# Patient Record
Sex: Female | Born: 1965 | Race: White | Hispanic: No | Marital: Married | Smoking: Former smoker
Health system: Southern US, Community
[De-identification: ages and names within clinical notes are randomized; demographics above are authoritative.]

## PROBLEM LIST (undated history)

## (undated) DIAGNOSIS — Z8619 Personal history of other infectious and parasitic diseases: Secondary | ICD-10-CM

## (undated) DIAGNOSIS — J3089 Other allergic rhinitis: Secondary | ICD-10-CM

## (undated) DIAGNOSIS — E785 Hyperlipidemia, unspecified: Secondary | ICD-10-CM

## (undated) DIAGNOSIS — F32A Depression, unspecified: Secondary | ICD-10-CM

## (undated) DIAGNOSIS — R51 Headache: Secondary | ICD-10-CM

## (undated) DIAGNOSIS — I8390 Asymptomatic varicose veins of unspecified lower extremity: Secondary | ICD-10-CM

## (undated) DIAGNOSIS — Z87442 Personal history of urinary calculi: Secondary | ICD-10-CM

## (undated) DIAGNOSIS — I872 Venous insufficiency (chronic) (peripheral): Secondary | ICD-10-CM

## (undated) DIAGNOSIS — N393 Stress incontinence (female) (male): Secondary | ICD-10-CM

## (undated) DIAGNOSIS — K219 Gastro-esophageal reflux disease without esophagitis: Secondary | ICD-10-CM

## (undated) DIAGNOSIS — M797 Fibromyalgia: Principal | ICD-10-CM

## (undated) DIAGNOSIS — K644 Residual hemorrhoidal skin tags: Secondary | ICD-10-CM

## (undated) DIAGNOSIS — K648 Other hemorrhoids: Secondary | ICD-10-CM

## (undated) DIAGNOSIS — F419 Anxiety disorder, unspecified: Secondary | ICD-10-CM

## (undated) DIAGNOSIS — J4520 Mild intermittent asthma, uncomplicated: Secondary | ICD-10-CM

## (undated) DIAGNOSIS — F329 Major depressive disorder, single episode, unspecified: Secondary | ICD-10-CM

## (undated) DIAGNOSIS — R911 Solitary pulmonary nodule: Secondary | ICD-10-CM

## (undated) DIAGNOSIS — N8500 Endometrial hyperplasia, unspecified: Secondary | ICD-10-CM

## (undated) DIAGNOSIS — K449 Diaphragmatic hernia without obstruction or gangrene: Secondary | ICD-10-CM

## (undated) DIAGNOSIS — Z973 Presence of spectacles and contact lenses: Secondary | ICD-10-CM

## (undated) DIAGNOSIS — M1990 Unspecified osteoarthritis, unspecified site: Secondary | ICD-10-CM

## (undated) DIAGNOSIS — Z8719 Personal history of other diseases of the digestive system: Secondary | ICD-10-CM

## (undated) DIAGNOSIS — K589 Irritable bowel syndrome without diarrhea: Secondary | ICD-10-CM

## (undated) DIAGNOSIS — N3010 Interstitial cystitis (chronic) without hematuria: Secondary | ICD-10-CM

## (undated) HISTORY — DX: Headache: R51

## (undated) HISTORY — DX: Diaphragmatic hernia without obstruction or gangrene: K44.9

## (undated) HISTORY — DX: Solitary pulmonary nodule: R91.1

## (undated) HISTORY — DX: Irritable bowel syndrome without diarrhea: K58.9

## (undated) HISTORY — DX: Venous insufficiency (chronic) (peripheral): I87.2

## (undated) HISTORY — DX: Gastro-esophageal reflux disease without esophagitis: K21.9

## (undated) HISTORY — DX: Residual hemorrhoidal skin tags: K64.4

## (undated) HISTORY — DX: Asymptomatic varicose veins of unspecified lower extremity: I83.90

## (undated) HISTORY — DX: Anxiety disorder, unspecified: F41.9

## (undated) HISTORY — DX: Other hemorrhoids: K64.8

## (undated) HISTORY — DX: Personal history of urinary calculi: Z87.442

## (undated) HISTORY — DX: Depression, unspecified: F32.A

## (undated) HISTORY — PX: ESOPHAGOGASTRODUODENOSCOPY: SHX1529

## (undated) HISTORY — PX: TONSILLECTOMY: SUR1361

## (undated) HISTORY — DX: Major depressive disorder, single episode, unspecified: F32.9

## (undated) HISTORY — DX: Hyperlipidemia, unspecified: E78.5

## (undated) HISTORY — DX: Fibromyalgia: M79.7

## (undated) HISTORY — PX: COLONOSCOPY: SHX174

## (undated) HISTORY — DX: Interstitial cystitis (chronic) without hematuria: N30.10

---
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
## 1995-06-25 DIAGNOSIS — M797 Fibromyalgia: Secondary | ICD-10-CM

## 1995-06-25 HISTORY — DX: Fibromyalgia: M79.7

## 1999-04-03 ENCOUNTER — Other Ambulatory Visit: Admission: RE | Admit: 1999-04-03 | Discharge: 1999-04-03 | Payer: Self-pay | Admitting: Obstetrics and Gynecology

## 1999-11-01 ENCOUNTER — Ambulatory Visit: Admission: RE | Admit: 1999-11-01 | Discharge: 1999-11-01 | Payer: Self-pay | Admitting: Obstetrics and Gynecology

## 2000-02-20 ENCOUNTER — Encounter (INDEPENDENT_AMBULATORY_CARE_PROVIDER_SITE_OTHER): Payer: Self-pay

## 2000-02-20 ENCOUNTER — Ambulatory Visit (HOSPITAL_COMMUNITY): Admission: RE | Admit: 2000-02-20 | Discharge: 2000-02-20 | Payer: Self-pay | Admitting: Obstetrics and Gynecology

## 2000-05-22 ENCOUNTER — Other Ambulatory Visit: Admission: RE | Admit: 2000-05-22 | Discharge: 2000-05-22 | Payer: Self-pay | Admitting: Obstetrics and Gynecology

## 2000-06-24 DIAGNOSIS — E785 Hyperlipidemia, unspecified: Secondary | ICD-10-CM

## 2000-06-24 HISTORY — DX: Hyperlipidemia, unspecified: E78.5

## 2000-08-26 ENCOUNTER — Ambulatory Visit (HOSPITAL_COMMUNITY): Admission: RE | Admit: 2000-08-26 | Discharge: 2000-08-26 | Payer: Self-pay | Admitting: Obstetrics and Gynecology

## 2000-08-26 ENCOUNTER — Encounter: Payer: Self-pay | Admitting: Obstetrics and Gynecology

## 2001-06-02 ENCOUNTER — Other Ambulatory Visit: Admission: RE | Admit: 2001-06-02 | Discharge: 2001-06-02 | Payer: Self-pay | Admitting: Obstetrics & Gynecology

## 2001-10-30 ENCOUNTER — Encounter: Payer: Self-pay | Admitting: Obstetrics & Gynecology

## 2001-10-30 ENCOUNTER — Ambulatory Visit (HOSPITAL_COMMUNITY): Admission: RE | Admit: 2001-10-30 | Discharge: 2001-10-30 | Payer: Self-pay | Admitting: Obstetrics & Gynecology

## 2002-05-24 ENCOUNTER — Emergency Department (HOSPITAL_COMMUNITY): Admission: EM | Admit: 2002-05-24 | Discharge: 2002-05-24 | Payer: Self-pay | Admitting: Emergency Medicine

## 2002-05-25 ENCOUNTER — Encounter: Admission: RE | Admit: 2002-05-25 | Discharge: 2002-05-25 | Payer: Self-pay | Admitting: Family Medicine

## 2002-05-25 ENCOUNTER — Encounter: Payer: Self-pay | Admitting: Family Medicine

## 2002-06-04 ENCOUNTER — Other Ambulatory Visit: Admission: RE | Admit: 2002-06-04 | Discharge: 2002-06-04 | Payer: Self-pay | Admitting: Gynecology

## 2002-11-15 ENCOUNTER — Encounter: Payer: Self-pay | Admitting: Family Medicine

## 2002-11-15 ENCOUNTER — Encounter: Admission: RE | Admit: 2002-11-15 | Discharge: 2002-11-15 | Payer: Self-pay | Admitting: Family Medicine

## 2002-11-16 ENCOUNTER — Encounter: Admission: RE | Admit: 2002-11-16 | Discharge: 2002-11-16 | Payer: Self-pay | Admitting: Family Medicine

## 2002-11-16 ENCOUNTER — Encounter: Payer: Self-pay | Admitting: Family Medicine

## 2003-05-12 ENCOUNTER — Ambulatory Visit (HOSPITAL_COMMUNITY): Admission: RE | Admit: 2003-05-12 | Discharge: 2003-05-12 | Payer: Self-pay | Admitting: Gastroenterology

## 2003-05-12 ENCOUNTER — Encounter (INDEPENDENT_AMBULATORY_CARE_PROVIDER_SITE_OTHER): Payer: Self-pay | Admitting: Specialist

## 2003-06-09 ENCOUNTER — Other Ambulatory Visit: Admission: RE | Admit: 2003-06-09 | Discharge: 2003-06-09 | Payer: Self-pay | Admitting: *Deleted

## 2003-07-25 ENCOUNTER — Encounter: Admission: RE | Admit: 2003-07-25 | Discharge: 2003-07-25 | Payer: Self-pay | Admitting: Neurosurgery

## 2003-09-06 ENCOUNTER — Encounter: Admission: RE | Admit: 2003-09-06 | Discharge: 2003-09-12 | Payer: Self-pay | Admitting: Neurosurgery

## 2004-04-02 ENCOUNTER — Encounter: Admission: RE | Admit: 2004-04-02 | Discharge: 2004-04-02 | Payer: Self-pay | Admitting: Family Medicine

## 2004-06-12 ENCOUNTER — Other Ambulatory Visit: Admission: RE | Admit: 2004-06-12 | Discharge: 2004-06-12 | Payer: Self-pay | Admitting: Obstetrics & Gynecology

## 2004-06-24 DIAGNOSIS — N3010 Interstitial cystitis (chronic) without hematuria: Secondary | ICD-10-CM

## 2004-06-24 HISTORY — DX: Interstitial cystitis (chronic) without hematuria: N30.10

## 2004-11-27 ENCOUNTER — Encounter: Admission: RE | Admit: 2004-11-27 | Discharge: 2004-11-27 | Payer: Self-pay | Admitting: Gastroenterology

## 2005-08-16 ENCOUNTER — Other Ambulatory Visit: Admission: RE | Admit: 2005-08-16 | Discharge: 2005-08-16 | Payer: Self-pay | Admitting: Obstetrics & Gynecology

## 2005-10-15 ENCOUNTER — Encounter: Admission: RE | Admit: 2005-10-15 | Discharge: 2005-10-15 | Payer: Self-pay | Admitting: Gastroenterology

## 2005-10-18 ENCOUNTER — Encounter: Admission: RE | Admit: 2005-10-18 | Discharge: 2005-10-18 | Payer: Self-pay | Admitting: Rheumatology

## 2006-06-24 DIAGNOSIS — K589 Irritable bowel syndrome without diarrhea: Secondary | ICD-10-CM

## 2006-06-24 HISTORY — DX: Irritable bowel syndrome, unspecified: K58.9

## 2008-08-17 ENCOUNTER — Encounter: Admission: RE | Admit: 2008-08-17 | Discharge: 2008-08-17 | Payer: Self-pay | Admitting: Family Medicine

## 2008-09-14 ENCOUNTER — Emergency Department (HOSPITAL_BASED_OUTPATIENT_CLINIC_OR_DEPARTMENT_OTHER): Admission: EM | Admit: 2008-09-14 | Discharge: 2008-09-14 | Payer: Self-pay | Admitting: Emergency Medicine

## 2008-09-14 ENCOUNTER — Ambulatory Visit: Payer: Self-pay | Admitting: Interventional Radiology

## 2008-09-14 DIAGNOSIS — R911 Solitary pulmonary nodule: Secondary | ICD-10-CM

## 2008-09-14 HISTORY — DX: Solitary pulmonary nodule: R91.1

## 2009-02-10 ENCOUNTER — Ambulatory Visit: Payer: Self-pay | Admitting: Diagnostic Radiology

## 2009-02-10 ENCOUNTER — Telehealth: Payer: Self-pay | Admitting: Internal Medicine

## 2009-02-10 ENCOUNTER — Ambulatory Visit (HOSPITAL_BASED_OUTPATIENT_CLINIC_OR_DEPARTMENT_OTHER): Admission: RE | Admit: 2009-02-10 | Discharge: 2009-02-10 | Payer: Self-pay | Admitting: Internal Medicine

## 2009-02-10 ENCOUNTER — Ambulatory Visit: Payer: Self-pay | Admitting: Internal Medicine

## 2009-02-10 DIAGNOSIS — F411 Generalized anxiety disorder: Secondary | ICD-10-CM | POA: Insufficient documentation

## 2009-02-10 DIAGNOSIS — E785 Hyperlipidemia, unspecified: Secondary | ICD-10-CM | POA: Insufficient documentation

## 2009-02-10 DIAGNOSIS — J309 Allergic rhinitis, unspecified: Secondary | ICD-10-CM | POA: Insufficient documentation

## 2009-02-10 DIAGNOSIS — F418 Other specified anxiety disorders: Secondary | ICD-10-CM | POA: Insufficient documentation

## 2009-02-10 DIAGNOSIS — E669 Obesity, unspecified: Secondary | ICD-10-CM

## 2009-02-10 DIAGNOSIS — K219 Gastro-esophageal reflux disease without esophagitis: Secondary | ICD-10-CM

## 2009-03-29 ENCOUNTER — Ambulatory Visit: Payer: Self-pay | Admitting: Internal Medicine

## 2009-03-31 ENCOUNTER — Encounter: Payer: Self-pay | Admitting: Internal Medicine

## 2009-03-31 LAB — CONVERTED CEMR LAB
ALT: 14 units/L (ref 0–35)
AST: 17 units/L (ref 0–37)
Alkaline Phosphatase: 92 units/L (ref 39–117)
Eosinophils Absolute: 0.2 10*3/uL (ref 0.0–0.7)
Eosinophils Relative: 2 % (ref 0–5)
Glucose, Bld: 96 mg/dL (ref 70–99)
Hemoglobin: 12.2 g/dL (ref 12.0–15.0)
Indirect Bilirubin: 0.2 mg/dL (ref 0.0–0.9)
Lymphs Abs: 3.3 10*3/uL (ref 0.7–4.0)
MCHC: 31.3 g/dL (ref 30.0–36.0)
MCV: 87.1 fL (ref 78.0–100.0)
Neutro Abs: 5.6 10*3/uL (ref 1.7–7.7)
RBC: 4.48 M/uL (ref 3.87–5.11)
RDW: 14.2 % (ref 11.5–15.5)
Sodium: 140 meq/L (ref 135–145)
Total Bilirubin: 0.3 mg/dL (ref 0.3–1.2)
Total CHOL/HDL Ratio: 4.8
Total Protein: 6.7 g/dL (ref 6.0–8.3)
Triglycerides: 148 mg/dL (ref <150)

## 2009-04-07 ENCOUNTER — Ambulatory Visit: Payer: Self-pay | Admitting: Internal Medicine

## 2009-04-07 DIAGNOSIS — J40 Bronchitis, not specified as acute or chronic: Secondary | ICD-10-CM | POA: Insufficient documentation

## 2009-04-09 ENCOUNTER — Telehealth: Payer: Self-pay | Admitting: Internal Medicine

## 2009-04-11 ENCOUNTER — Ambulatory Visit (HOSPITAL_BASED_OUTPATIENT_CLINIC_OR_DEPARTMENT_OTHER): Admission: RE | Admit: 2009-04-11 | Discharge: 2009-04-11 | Payer: Self-pay | Admitting: Internal Medicine

## 2009-04-11 ENCOUNTER — Ambulatory Visit: Payer: Self-pay | Admitting: Radiology

## 2009-04-11 ENCOUNTER — Telehealth: Payer: Self-pay | Admitting: Internal Medicine

## 2009-04-11 ENCOUNTER — Ambulatory Visit: Payer: Self-pay | Admitting: Internal Medicine

## 2009-04-20 ENCOUNTER — Telehealth: Payer: Self-pay | Admitting: Family

## 2009-04-25 ENCOUNTER — Telehealth: Payer: Self-pay | Admitting: Internal Medicine

## 2009-04-26 ENCOUNTER — Ambulatory Visit: Payer: Self-pay | Admitting: Internal Medicine

## 2009-04-27 ENCOUNTER — Telehealth: Payer: Self-pay | Admitting: Internal Medicine

## 2009-05-01 ENCOUNTER — Encounter: Payer: Self-pay | Admitting: Internal Medicine

## 2009-05-10 ENCOUNTER — Telehealth: Payer: Self-pay | Admitting: Internal Medicine

## 2009-05-15 LAB — HM MAMMOGRAPHY: HM Mammogram: NORMAL

## 2009-05-25 ENCOUNTER — Telehealth: Payer: Self-pay | Admitting: Internal Medicine

## 2009-05-26 ENCOUNTER — Telehealth: Payer: Self-pay | Admitting: Internal Medicine

## 2009-05-26 ENCOUNTER — Ambulatory Visit: Payer: Self-pay | Admitting: Family

## 2009-05-26 DIAGNOSIS — J019 Acute sinusitis, unspecified: Secondary | ICD-10-CM

## 2009-06-20 ENCOUNTER — Telehealth: Payer: Self-pay | Admitting: Internal Medicine

## 2009-07-05 ENCOUNTER — Telehealth: Payer: Self-pay | Admitting: Internal Medicine

## 2009-07-10 ENCOUNTER — Telehealth: Payer: Self-pay | Admitting: Internal Medicine

## 2009-08-29 ENCOUNTER — Ambulatory Visit: Payer: Self-pay | Admitting: Internal Medicine

## 2009-08-29 DIAGNOSIS — N390 Urinary tract infection, site not specified: Secondary | ICD-10-CM | POA: Insufficient documentation

## 2009-08-29 LAB — CONVERTED CEMR LAB
Bilirubin Urine: NEGATIVE
Glucose, Urine, Semiquant: NEGATIVE
Ketones, urine, test strip: NEGATIVE
Specific Gravity, Urine: 1.015

## 2009-09-01 ENCOUNTER — Telehealth: Payer: Self-pay | Admitting: Internal Medicine

## 2009-09-26 ENCOUNTER — Telehealth: Payer: Self-pay | Admitting: Internal Medicine

## 2009-10-16 ENCOUNTER — Telehealth: Payer: Self-pay | Admitting: Internal Medicine

## 2009-10-23 ENCOUNTER — Encounter: Payer: Self-pay | Admitting: Internal Medicine

## 2009-10-23 ENCOUNTER — Telehealth: Payer: Self-pay | Admitting: Internal Medicine

## 2009-11-07 ENCOUNTER — Telehealth: Payer: Self-pay | Admitting: Internal Medicine

## 2009-11-10 ENCOUNTER — Ambulatory Visit: Payer: Self-pay | Admitting: Internal Medicine

## 2009-11-15 ENCOUNTER — Telehealth: Payer: Self-pay | Admitting: Internal Medicine

## 2009-11-27 ENCOUNTER — Telehealth: Payer: Self-pay | Admitting: Internal Medicine

## 2009-12-13 ENCOUNTER — Telehealth: Payer: Self-pay | Admitting: Internal Medicine

## 2009-12-15 ENCOUNTER — Ambulatory Visit: Payer: Self-pay | Admitting: Internal Medicine

## 2009-12-15 LAB — CONVERTED CEMR LAB
Glucose, Urine, Semiquant: NEGATIVE
Nitrite: NEGATIVE
Urobilinogen, UA: 0.2

## 2009-12-20 ENCOUNTER — Telehealth: Payer: Self-pay | Admitting: Internal Medicine

## 2010-02-09 ENCOUNTER — Telehealth: Payer: Self-pay | Admitting: Internal Medicine

## 2010-02-27 ENCOUNTER — Telehealth: Payer: Self-pay | Admitting: Internal Medicine

## 2010-03-05 ENCOUNTER — Ambulatory Visit: Payer: Self-pay | Admitting: Internal Medicine

## 2010-03-05 ENCOUNTER — Ambulatory Visit: Payer: Self-pay | Admitting: Diagnostic Radiology

## 2010-03-05 ENCOUNTER — Ambulatory Visit (HOSPITAL_BASED_OUTPATIENT_CLINIC_OR_DEPARTMENT_OTHER): Admission: RE | Admit: 2010-03-05 | Discharge: 2010-03-05 | Payer: Self-pay | Admitting: Internal Medicine

## 2010-03-07 ENCOUNTER — Telehealth: Payer: Self-pay | Admitting: Internal Medicine

## 2010-03-13 ENCOUNTER — Telehealth: Payer: Self-pay | Admitting: Internal Medicine

## 2010-03-26 ENCOUNTER — Ambulatory Visit: Payer: Self-pay | Admitting: Internal Medicine

## 2010-03-27 ENCOUNTER — Encounter: Payer: Self-pay | Admitting: Internal Medicine

## 2010-04-05 ENCOUNTER — Ambulatory Visit: Payer: Self-pay | Admitting: Internal Medicine

## 2010-05-08 ENCOUNTER — Ambulatory Visit: Payer: Self-pay | Admitting: Internal Medicine

## 2010-05-08 DIAGNOSIS — M79609 Pain in unspecified limb: Secondary | ICD-10-CM | POA: Insufficient documentation

## 2010-05-14 ENCOUNTER — Encounter: Payer: Self-pay | Admitting: Internal Medicine

## 2010-06-24 DIAGNOSIS — Z8619 Personal history of other infectious and parasitic diseases: Secondary | ICD-10-CM

## 2010-06-24 HISTORY — DX: Personal history of other infectious and parasitic diseases: Z86.19

## 2010-07-26 NOTE — Progress Notes (Signed)
Summary: sertraline, fluoxetine--unable to contact mailed letter  Phone Note Outgoing Call   Summary of Call: call pt - please make sure pt not taking both fluoxetine and sertraline Initial call taken by: D. Thomos Lemons DO,  Oct 23, 2009 1:13 PM  Follow-up for Phone Call        Numbers listed for pt. are not in service.  Letter mailed to pt. to contact office.  Mervin Kung CMA  Oct 23, 2009 1:22 PM      Appended Document: sertraline, fluoxetine--unable to contact mailed letter Spoke to pt. At 1:05pm. She states she is not taking Sertraline any longer. She is weaning off Fluoxetine and will start Effexor.

## 2010-07-26 NOTE — Progress Notes (Signed)
Summary: Status Update  Phone Note Call from Patient Call back at Home Phone [phone]   Caller: Patient Call For: D. Thomos Lemons DO Summary of Call: patient called and left voice message stating she is feeling more depressed and may need a higher dose of the Celexa. She states she was taking a higher dose with the Zoloft and was not sure how the doses would compare, but feels she needs a higher dose. Please advise Initial call taken by: Glendell Docker CMA,  December 20, 2009 4:20 PM  Follow-up for Phone Call        see new rx.  needs f/u within 2 wks of starting medication Follow-up by: D. Thomos Lemons DO,  December 21, 2009 1:54 PM  Additional Follow-up for Phone Call Additional follow up Details #1::        patient advised per Dr Artist Pais instructions Additional Follow-up by: Glendell Docker CMA,  December 21, 2009 3:12 PM    New/Updated Medications: CITALOPRAM HYDROBROMIDE 40 MG TABS (CITALOPRAM HYDROBROMIDE) one by mouth once daily Prescriptions: CITALOPRAM HYDROBROMIDE 40 MG TABS (CITALOPRAM HYDROBROMIDE) one by mouth once daily  #30 x 2   Entered and Authorized by:   D. Thomos Lemons DO   Signed by:   D. Thomos Lemons DO on 12/21/2009   Method used:   [facility]  [place] [address] [facility]       [address]       [place], [place]  [address]       Ph: [phone] or [phone]       Fax: [phone]   RxID:   [id number]
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
## 2010-07-26 NOTE — Progress Notes (Signed)
Summary: med questions  Phone Note Call from Patient Call back at [phone] after 2pm   Caller: Patient Call For: D. Thomos Lemons DO Summary of Call: Received message from pt stating that Symbicort makes her nervous and shaky. Wants to know if this is normal and will it go away? Stomach burns for 1 -2 hours after taking azithromycin even though she takes it with food. Should she continue taking it? She can still hear crackling in her chest. Should she take Robitussin to break it up?  Please advise.  Nicki Guadalajara Fergerson CMA Duncan Dull)  March 07, 2010 1:47 PM   Follow-up for Phone Call        yes, it is common rxn with use of symbicort that it will make her feel shaky. I suggest she try drinking extra water with azithromycin  ok to take robitussin or guafenisin Follow-up by: D. Thomos Lemons DO,  March 07, 2010 8:51 PM  Additional Follow-up for Phone Call Additional follow up Details #1::        read Dr Olegario Messier answer to patient  Roselle Locus  March 08, 2010 11:58 AM

## 2010-07-26 NOTE — Progress Notes (Signed)
Summary: Omeprazole Refill  Phone Note Refill Request Message from:  Fax from [employer] on Nov 15, 2009 9:40 AM  Refills Requested: Medication #1:  OMEPRAZOLE-SODIUM BICARBONATE 40-1100 MG CAPS one by mouth two times a day   Dosage confirmed as above?Dosage Confirmed   Brand Name Necessary? No   Supply Requested: 3 months  Method Requested: Electronic Next Appointment Scheduled: None Initial call taken by: Glendell Docker CMA,  Nov 15, 2009 9:41 AM  Follow-up for Phone Call        Rx faxed to pharmacy Follow-up by: Glendell Docker CMA,  Nov 15, 2009 9:43 AM    Prescriptions: Maxwell Caul BICARBONATE 40-1100 MG CAPS (OMEPRAZOLE-SODIUM BICARBONATE) one by mouth two times a day  #180 x 4   Entered by:   Glendell Docker CMA   Authorized by:   D. Thomos Lemons DO   Signed by:   Glendell Docker CMA on 11/15/2009   Method used:   Faxed to ...       [facility] F[facility]) (mail-order)             ,          Ph: [phone]       Fax: [phone]   RxID:   [id number]

## 2010-07-26 NOTE — Progress Notes (Signed)
Summary: Medication Refills  Phone Note Refill Request Message from:  Patient on September 26, 2009 4:34 PM  Refills Requested: Medication #1:  ASTELIN 137 MCG/SPRAY SOLN 2 sprays each nostril once daily   Supply Requested: 3 months  Medication #2:  SIMVASTATIN 40 MG TABS Take 1 tablet by mouth once a day   Supply Requested: 3 months  Medication #3:  FENOFIBRATE 160 MG TABS Take 1 tablet by mouth once a day   Supply Requested: 3 months patient called and left voice message requesting refills to mail order right source    Method Requested: Electronic Initial call taken by: Glendell Docker CMA,  September 26, 2009 4:35 PM  Follow-up for Phone Call        Rx completed in Dr. Tiajuana Amass Follow-up by: Glendell Docker CMA,  September 26, 2009 4:37 PM    Prescriptions: SIMVASTATIN 40 MG TABS (SIMVASTATIN) Take 1 tablet by mouth once a day  #90 x 2   Entered by:   Glendell Docker CMA   Authorized by:   D. Thomos Lemons DO   Signed by:   Glendell Docker CMA on 09/26/2009   Method used:   Electronically to        [facility] [address] (mail[facility]order)       [facility]       [address]       [place], [place]  [address]       Ph: [phone]       Fax: [phone]   RxID:   [id number] FENOFIBRATE 160 MG TABS (FENOFIBRATE) Take 1 tablet by mouth once a day  #90 x 2   Entered by:   Glendell Docker CMA   Authorized by:   D. Thomos Lemons DO   Signed by:   Glendell Docker CMA on 09/26/2009   Method used:   Electronically to        [facility] [address] (mail-order)       [facility]       [address]       [place], [place]  [address]       Ph: [phone]       Fax: [phone]   RxID:   [id number] ASTELIN 137 MCG/SPRAY SOLN (AZELASTINE HCL) 2 sprays each nostril once daily  #90 x 2   Entered by:   Glendell Docker CMA   Authorized by:   D. Thomos Lemons DO   Signed by:   Glendell Docker CMA on 09/26/2009   Method used:   Electronically to        [facility] (mail[facility]order)       [facility]       [address]       [place], [place]  [address]       Ph: [phone]       Fax: [phone]   RxID:   [id number]

## 2010-07-26 NOTE — Progress Notes (Signed)
Summary: Medication Status  Update  Phone Note Call from Patient Call back at [phone]   Caller: Patient Summary of Call: patient called and left voice message stating she started the Effexor 75mg  and she stopped it because she started to have bad dreams and unclear thoughts. She states she has decided to stay on the 50mg  of Zoloft.  Initial call taken by: Glendell Docker CMA,  July 10, 2009 4:25 PM  Follow-up for Phone Call        noted Follow-up by: D. Thomos Lemons DO,  July 10, 2009 4:54 PM   New Allergies: EFFEXOR XR (VENLAFAXINE HCL) New/Updated Medications: SERTRALINE HCL 50 MG TABS (SERTRALINE HCL) one by mouth qd New Allergies: EFFEXOR XR (VENLAFAXINE HCL)Prescriptions: SERTRALINE HCL 50 MG TABS (SERTRALINE HCL) one by mouth qd  #30 x 3   Entered and Authorized by:   D. Thomos Lemons DO   Signed by:   D. Thomos Lemons DO on 07/10/2009   Method used:   [facility]        [facility]  [place] [address] [facility]       [address]       [place], [place]  [address]       Ph: [phone] or [phone]       Fax: [phone]   RxID:   [id number]

## 2010-07-26 NOTE — Progress Notes (Signed)
Summary: Status Update   Phone Note Call from Patient Call back at Home Phone [phone]   Caller: Patient Summary of Call: patient called and left voice message regarding the Effexor. Her message states when she first started the medication she was having tingling, headaches, nausea and fatigue. She states that some of her OCD and panic attacks have returned. She has taken her last pill today and would like to know where she is to go from here. She states she would like to continue trying the medication maybe at a higher dose to see if it will work better.  The headaches, tingling, nausea have leveled off, but she is still have some fatigue, and the ocd and panic moments. Please advise.  She does not have a follow up appointment scheduled Initial call taken by: Glendell Docker CMA,  July 05, 2009 3:06 PM  Follow-up for Phone Call        She normally takes her meds in the am and did not take any meds today waiting to hear from her message yesterday. She took her last pill of her Effexor yesterday but she still has some zoloft to take.  What should she do.  Mallory [name]  July 06, 2009 1:05 PM CALL [phone]  Additional Follow-up for Phone Call Additional follow up Details #1::        yes, increase effexor to 75mg .  keep taking 1/2 tab of zoloft.  f/u next week Additional Follow-up by: D. Thomos Lemons DO,  July 06, 2009 5:25 PM    Additional Follow-up for Phone Call Additional follow up Details #2::    attemtped to contact patient at [phone], no answer, detailed voice message left advising patient per Dr Artist Pais instructions Follow-up by: Glendell Docker CMA,  July 06, 2009 5:33 PM  New/Updated Medications: VENLAFAXINE HCL 75 MG XR24H-CAP (VENLAFAXINE HCL) one by mouth once daily Prescriptions: VENLAFAXINE HCL 75 MG XR24H-CAP (VENLAFAXINE HCL) one by mouth once daily  #30 x 0   Entered and Authorized by:   D. Thomos Lemons DO   Signed by:   D. Thomos Lemons DO on 07/06/2009   Method  used:   [facility]        [facility]  [place] [address] [facility]       [address]       [place], [place]  [address]       Ph: [phone] or [phone]       Fax: [phone]   RxID:   [id number]

## 2010-07-26 NOTE — Miscellaneous (Signed)
Summary: Orders Update pft charges  Clinical Lists Changes  Orders: Added new Service order of Carbon Monoxide diffusing w/capacity (94720) - Signed Added new Service order of Lung Volumes (94240) - Signed Added new Service order of Spirometry (Pre & Post) (94060) - Signed 

## 2010-07-26 NOTE — Letter (Signed)
Summary: Generic Letter  [facility]  [address]   [place], [place] [address]   Phone: [phone]  Fax: [phone]       10/23/2009     [name] [place], [place]  [address]    Dear [name],  We have been unable to reach you by phone and have some important medical information to share with you.  Please call our office Monday - Friday at [phone] from 8am - 5pm.   Please let us know if your phone numbers have changed.  Sincerely,    Mervin Kung CMA

## 2010-07-26 NOTE — Assessment & Plan Note (Signed)
Summary: heel pain & numbness/dt   Vital Signs:  Patient profile:   45 year old female Menstrual status:  regular Height:      71.5 inches Weight:      254.75 pounds BMI:     35.16 O2 Sat:      98 % on Room air Temp:     98.1 degrees F oral Pulse rate:   84 / minute Resp:     16 per minute BP sitting:   110 / 72  (right arm) Cuff size:   large  Vitals Entered By: Glendell Docker CMA (May 08, 2010 10:03 AM)  O2 Flow:  Room air CC: Heel Pain & Numbness Is Patient Diabetic? No Pain Assessment Patient in pain? no        Primary Care Provider:  Dondra Spry DO  CC:  Heel Pain & Numbness.  History of Present Illness: walks on a hard floor all day, c/o right heel numbness ongoing for the past 3 months,  past 2 weeks c/o ears feel full of pressure, throat irritation, Motrin taken with no relief  Preventive Screening-Counseling & Management  Alcohol-Tobacco     Smoking Status: never  Allergies: 1)  Effexor Xr (Venlafaxine Hcl)  Past History:  Past Medical History: Possible Mild Asthma -95 Anxiety / Depression- no counseling GERD (hiatal hernia)  Allergic rhinitis-year round     Hyperlipidemia 2002    Irritable bowel Syndrome 2008   Interstial Cystitis- 2006- Dr Marcine Matar Hx of pulmonary nodule - 09/14/2008  (Incidental finding on CT of Chest ordered for chest pain)   Family History: Family History of Alcoholism/Addiction Family History of Arthritis-paternal grandmother Family History Breast cancer - mother Family History of Colon CA maternal aunt Family History Diabetes mother  Family History High cholesterol- mother Family History Hypertension-mother              Social History: Occupation: unemployed Married 21 years 1 son 38   1 daughter 9         Never Smoked  Physical Exam  General:  alert, well-developed, and well-nourished.   Lungs:  normal respiratory effort and normal breath sounds.   Heart:  normal rate, regular rhythm, and  no gallop.   Msk:  no heel tenderness Neurologic:  cranial nerves II-XII intact and gait normal.     Impression & Recommendations:  Problem # 1:  HEEL PAIN, RIGHT (ICD-729.5) pt may have tarsal tunnel syndrome refer to ortho for additional w/u and treatment  Orders: Orthopedic Referral (Ortho)  Problem # 2:  ALLERGIC RHINITIS (ICD-477.9) prev seen by Dr. Barnetta Chapel.  she has sensitivity to mold, dust mites and pet dander she could not tolerate allergy shots allegra, otc claritin and zyrtec not helping she has tried xyzal in the past which worked better than other antihistamines   Her updated medication list for this problem includes:    Xyzal 5 Mg Tabs (Levocetirizine dihydrochloride) ..... One by mouth once daily    Fluticasone Propionate 50 Mcg/act Susp (Fluticasone propionate) .Marland Kitchen... 2 sprays each nostril once daily  Complete Medication List: 1)  Xyzal 5 Mg Tabs (Levocetirizine dihydrochloride) .... One by mouth once daily 2)  Fluticasone Propionate 50 Mcg/act Susp (Fluticasone propionate) .... 2 sprays each nostril once daily 3)  Simvastatin 40 Mg Tabs (Simvastatin) .... Take 1 tablet by mouth once a day 4)  Omeprazole-sodium Bicarbonate 40-1100 Mg Caps (Omeprazole-sodium bicarbonate) .... Take 1 capsule by mouth once a day 5)  Junel 1/20 1-20 Mg-mcg Tabs (  Norethindrone acet-ethinyl est) .... Take 1 tablet by mouth once a day 6)  Align Caps (Probiotic product) .... Take 1 tablet by mouth once a day 7)  Multivitamins Tabs (Multiple vitamin) .... Take 1 tablet by mouth once a day 8)  Librax 2.5-5 Mg Caps (Clidinium-chlordiazepoxide) .... Take 1 tablet by mouth two times a day as needed 9)  Proair Hfa 108 (90 Base) Mcg/act Aers (Albuterol sulfate) .... Inhale1- 2 puffs by mouth every 4 -6 hours as needed 10)  Xanax 0.5 Mg Tabs (Alprazolam) .... Take 1 tablet by mouth once a day as needed 11)  Sertraline Hcl 50 Mg Tabs (Sertraline hcl) .... One by mouth once daily  Patient  Instructions: 1)  Keep you next appointment 2)  Our office will contact you re:  referral to orthopedic specialist Prescriptions: XYZAL 5 MG TABS (LEVOCETIRIZINE DIHYDROCHLORIDE) one by mouth once daily  #90 x 1   Entered and Authorized by:   D. Thomos Lemons DO   Signed by:   D. Thomos Lemons DO on 05/08/2010   Method used:   [facility]        [facility]  [place] [address]       [address]       [place], [place]  [address]       Ph: [phone] or [phone]       Fax: [phone]   RxID:   [id number]    Orders Added: 1)  Orthopedic Referral [Ortho] 2)  Est. Patient Level III [[address]]   Immunization History:  Influenza Immunization History:    Influenza:  historical (04/03/2010)   Immunization History:  Influenza Immunization History:    Influenza:  Historical (04/03/2010)   Current Allergies (reviewed today): EFFEXOR XR (VENLAFAXINE HCL)

## 2010-07-26 NOTE — Progress Notes (Signed)
Summary: Weaning off Medication  Phone Note Call from Patient Call back at Home Phone [phone]   Caller: Patient Call For: D. Thomos Lemons DO Summary of Call: patient called and left voice message requesting instructions on how to wean off of the Celexa. She states she was previously advised that she could take the Prozac with the Celexa, and she would like to know if she could be provided with written instructions Initial call taken by: Glendell Docker CMA,  February 09, 2010 4:51 PM  Follow-up for Phone Call        Pt request CB at [phone] [name]  February 12, 2010 1:42 PM  Additional Follow-up for Phone Call Additional follow up Details #1::        no need to take prozac with weaning of citalopram I suggest pt take 1/2 of 40 mg x 1 month then 1/2 every other day x 2 weeks then stop Additional Follow-up by: D. Thomos Lemons DO,  February 13, 2010 5:32 PM    Additional Follow-up for Phone Call Additional follow up Details #2::    call placed to patient at [phone], no answer. A detailed voice message was left informing patient per Dr Artist Pais instructions. Patient advised to call if any questions Follow-up by: Glendell Docker CMA,  February 14, 2010 10:52 AM

## 2010-07-26 NOTE — Assessment & Plan Note (Signed)
Summary: congestion cough/mhf   Vital Signs:  Patient profile:   45 year old female Menstrual status:  regular Weight:      258.25 pounds BMI:     35.64 O2 Sat:      97 % on Room air Temp:     98.3 degrees F oral Pulse rate:   82 / minute Pulse rhythm:   regular Resp:     18 per minute BP sitting:   116 / 74  (right arm) Cuff size:   large  Vitals Entered By: Glendell Docker CMA (March 05, 2010 11:46 AM)  O2 Flow:  Room air CC: URI Is Patient Diabetic? No Pain Assessment Patient in pain? no        Primary Care Provider:  Dondra Spry DO  CC:  URI.  History of Present Illness: 45 y/o white female c/o post nasal gtt 2 weeks ago - exp scratchy throat cough is changed, triggered by taking deep breath right ear discomfort no fever Taken Allegra, albuterol, robitussin with no improvement  hx of reflux - using zegerid two times a day    Preventive Screening-Counseling & Management  Alcohol-Tobacco     Smoking Status: quit  Allergies: 1)  Effexor Xr (Venlafaxine Hcl)  Past History:  Past Medical History: Asthma -95 Anxiety / Depression- no counseling GERD (hiatal hernia)  Allergic rhinitis-year round     Hyperlipidemia 2002   Irritable bowel Syndrome 2008   Interstial Cystitis- 2006- Dr Marcine Matar Hx of pulmonary nodule - 09/14/2008  (Incidental finding on CT of Chest ordered for chest pain)  Past Surgical History: kidney stone 1986 GYN surgery 2002- D&C             Social History: Occupation: unemployed Married 21 years 1 son 62   1 daughter 32       Smoking Status:  quit  Review of Systems  The patient denies fever and severe indigestion/heartburn.    Physical Exam  General:  alert and overweight-appearing.   Head:  normocephalic and atraumatic.   Ears:  R ear normal and L ear normal.   Mouth:  postnasal drip.   Neck:  supple and no masses.   Lungs:  normal respiratory effort, normal breath sounds, and faint exp  wheeze Heart:  normal rate, regular rhythm, and no gallop.   Extremities:  No lower extremity edema  Neurologic:  cranial nerves II-XII intact and gait normal.   Psych:  normally interactive, good eye contact, not anxious appearing, and not depressed appearing.     Impression & Recommendations:  Problem # 1:  BRONCHITIS (ICD-490)  The following medications were removed from the medication list:    Qvar 80 Mcg/act Aers (Beclomethasone dipropionate) .Marland Kitchen... 2 puffs two times a day    Cefuroxime Axetil 500 Mg Tabs (Cefuroxime axetil) ..... One by mouth  two times a day Her updated medication list for this problem includes:    Proair Hfa 108 (90 Base) Mcg/act Aers (Albuterol sulfate) ..... Inhale1- 2 puffs by mouth every 4 -6 hours as needed    Cefuroxime Axetil 500 Mg Tabs (Cefuroxime axetil) ..... One by mouth two times a day    Azithromycin 250 Mg Tabs (Azithromycin) .Marland Kitchen... 2 tabs on day one, then one by mouth once daily x 4 days    Qvar 80 Mcg/act Aers (Beclomethasone dipropionate) .Marland Kitchen... 2 puffs two times a day  Orders: CT without Contrast (CT w/o contrast)  Take antibiotics and other medications as directed. Encouraged to push  clear liquids, get enough rest, and take acetaminophen as needed. To be seen in 5-7 days if no improvement, sooner if worse.  Problem # 2:  PULMONARY NODULE (ICD-518.89) follow CT of chest  Orders: CT without Contrast (CT w/o contrast)  Problem # 3:  ANXIETY (ICD-300.00) Pt feels her symptoms best controlled when she is on sertraline  The following medications were removed from the medication list:    Citalopram Hydrobromide 40 Mg Tabs (Citalopram hydrobromide) ..... One by mouth once daily Her updated medication list for this problem includes:    Xanax 0.5 Mg Tabs (Alprazolam) .Marland Kitchen... Take 1 tablet by mouth once a day as needed    Sertraline Hcl 50 Mg Tabs (Sertraline hcl) ..... One by mouth once daily  Problem # 4:  ASTHMA (ICD-493.90)  The following  medications were removed from the medication list:    Qvar 80 Mcg/act Aers (Beclomethasone dipropionate) .Marland Kitchen... 2 puffs two times a day Her updated medication list for this problem includes:    Proair Hfa 108 (90 Base) Mcg/act Aers (Albuterol sulfate) ..... Inhale1- 2 puffs by mouth every 4 -6 hours as needed    Qvar 80 Mcg/act Aers (Beclomethasone dipropionate) .Marland Kitchen... 2 puffs two times a day  Orders: Pulmonary Referral (Pulmonary)  Complete Medication List: 1)  Loratadine 10 Mg Tabs (Loratadine) .... One by mouth qd 2)  Fluticasone Propionate 50 Mcg/act Susp (Fluticasone propionate) .... 2 sprays each nostril once daily 3)  Astelin 137 Mcg/spray Soln (Azelastine hcl) .... 2 sprays each nostril once daily 4)  Simvastatin 40 Mg Tabs (Simvastatin) .... Take 1 tablet by mouth once a day 5)  Omeprazole-sodium Bicarbonate 40-1100 Mg Caps (Omeprazole-sodium bicarbonate) .... One by mouth two times a day 6)  Junel 1/20 1-20 Mg-mcg Tabs (Norethindrone acet-ethinyl est) .... Take 1 tablet by mouth once a day 7)  Align Caps (Probiotic product) .... Take 1 tablet by mouth once a day 8)  Multivitamins Tabs (Multiple vitamin) .... Take 1 tablet by mouth once a day 9)  Librax 2.5-5 Mg Caps (Clidinium-chlordiazepoxide) .... Take 1 tablet by mouth two times a day as needed 10)  Proair Hfa 108 (90 Base) Mcg/act Aers (Albuterol sulfate) .... Inhale1- 2 puffs by mouth every 4 -6 hours as needed 11)  Xanax 0.5 Mg Tabs (Alprazolam) .... Take 1 tablet by mouth once a day as needed 12)  Sertraline Hcl 50 Mg Tabs (Sertraline hcl) .... One by mouth once daily 13)  Cefuroxime Axetil 500 Mg Tabs (Cefuroxime axetil) .... One by mouth two times a day 14)  Azithromycin 250 Mg Tabs (Azithromycin) .... 2 tabs on day one, then one by mouth once daily x 4 days 15)  Qvar 80 Mcg/act Aers (Beclomethasone dipropionate) .... 2 puffs two times a day  Patient Instructions: 1)  Please schedule a follow-up appointment in 1  month. Prescriptions: SYMBICORT 160-4.5 MCG/ACT AERO (BUDESONIDE-FORMOTEROL FUMARATE) 2 puffs two times a day  #1 x 2   Entered and Authorized by:   D. Thomos Lemons DO   Signed by:   D. Thomos Lemons DO on 03/05/2010   Method used:   [facility]        [facility]  [place] [address] [facility]       [address]       [place], [place]  [address]       Ph: [phone] or [phone]       Fax: [phone]   RxID:   [id number] AZITHROMYCIN 250 MG TABS (  AZITHROMYCIN) 2 tabs on day one, then one by mouth once daily x 4 days  #6 x 0   Entered and Authorized by:   D. Thomos Lemons DO   Signed by:   D. Thomos Lemons DO on 03/05/2010   Method used:   [facility]        [facility]  [place] [address] [facility]ail[facility]       [address]       [place], [place]  [address]       Ph: [phone] or [phone]       Fax: [phone]   RxID:   [id number] CEFUROXIME AXETIL 500 MG TABS (CEFUROXIME AXETIL) one by mouth two times a day  #20 x 0   Entered and Authorized by:   D. Thomos Lemons DO   Signed by:   D. Thomos Lemons DO on 03/05/2010   Method used:   [facility]        [facility]  [place] [address] [facility]       [address]       [place], [place]  [address]       Ph: [phone] or [phone]       Fax: [phone]   RxID:   [id number] SERTRALINE HCL 50 MG TABS (SERTRALINE HCL) one by mouth once daily  #90 x 1   Entered and Authorized by:   D. Thomos Lemons DO   Signed by:   D. Thomos Lemons DO on 03/05/2010   Method used:   [facility]        [facility]  [place] [address] [facility]ail[facility]       [address]       [place], [place]  [address]       Ph: [phone] or [phone]       Fax: [phone]   RxID:   [id number]     Current Allergies (reviewed today): EFFEXOR XR (VENLAFAXINE HCL)

## 2010-07-26 NOTE — Assessment & Plan Note (Signed)
Summary: MEDICATION FOLLOW UP/DK   Vital Signs:  Patient profile:   45 year old female Menstrual status:  regular Weight:      251.50 pounds BMI:     34.71 O2 Sat:      98 % on Room air Temp:     97.8 degrees F oral Pulse rate:   83 / minute Pulse rhythm:   regular Resp:     18 per minute BP sitting:   118 / 70  (right arm) Cuff size:   97large  Vitals Entered By: Glendell Docker CMA (Nov 10, 2009 2:25 PM)  O2 Flow:  Room air CC: Rm 2- Medication Follow up Is Patient Diabetic? No Comments discuss effects of medication    Primary Care Provider:  DThomos Lemons DO  CC:  Rm 2- Medication Follow up.  History of Present Illness: 45 y/o white female with hx of anxiety for f/u pt tried on effexor vs SSRI.  she did not have withdrawal symptoms with using prozac but overall feels more aggitated on effexor she describes hot flashes / sweats she avoids exercise for this reason.  Allergies: 1)  Effexor Xr (Venlafaxine Hcl)  Past History:  Past Medical History: Asthma -95 Anxiety / Depression- no counseling GERD (hiatal hernia)  Allergic rhinitis-year round   Hyperlipidemia 2002   Irritable bowel Syndrome 2008  Interstial Cystitis- 2006- Dr Marcine Matar Hx of pulmonary nodule - 09/14/2008  (Incidental finding on CT of Chest ordered for chest pain)  Past Surgical History: kidney stone 1986 GYN surgery 2002- D&C          Family History: Family History of Alcoholism/Addiction Family History of Arthritis-paternal grandmother Family History Breast cancer - mother Family History of Colon CA maternal aunt Family History Diabetes mother  Family History High cholesterol- mother Family History Hypertension-mother         Social History: Occupation: unemployed Married 21 years 1 son 67   1 daughter 34      Physical Exam  General:  alert, well-developed, and well-nourished.   Lungs:  normal respiratory effort and normal breath sounds.   Heart:  normal rate,  regular rhythm, and no gallop.   Neurologic:  cranial nerves II-XII intact and gait normal.     Impression & Recommendations:  Problem # 1:  ANXIETY (ICD-300.00) Assessment Deteriorated we switched SSRI with effexor due to concerns of wt gain.  however, anxiety symptoms worse.   restart SSRI.  use citalopram as it may be assoc with less wt gain  The following medications were removed from the medication list:    Venlafaxine Hcl 37.5 Mg Xr24h-cap (Venlafaxine hcl) ..... One cap by mouth once daily x 2 weeks, then 2 by mouth once daily Her updated medication list for this problem includes:    Xanax 0.5 Mg Tabs (Alprazolam) .Marland Kitchen... Take 1 tablet by mouth once a day as needed    Citalopram Hydrobromide 20 Mg Tabs (Citalopram hydrobromide) ..... One by mouth once daily  Complete Medication List: 1)  Loratadine 10 Mg Tabs (Loratadine) .... One by mouth qd 2)  Fluticasone Propionate 50 Mcg/act Susp (Fluticasone propionate) .... 2 sprays each nostril once daily 3)  Astelin 137 Mcg/spray Soln (Azelastine hcl) .... 2 sprays each nostril once daily 4)  Simvastatin 40 Mg Tabs (Simvastatin) .... Take 1 tablet by mouth once a day 5)  Omeprazole-sodium Bicarbonate 40-1100 Mg Caps (Omeprazole-sodium bicarbonate) .... One by mouth two times a day 6)  Junel 1/20 1-20 Mg-mcg Tabs (Norethindrone acet-ethinyl  est) .... Take 1 tablet by mouth once a day 7)  Align Caps (Probiotic product) .... Take 1 tablet by mouth once a day 8)  Multivitamins Tabs (Multiple vitamin) .... Take 1 tablet by mouth once a day 9)  Librax 2.5-5 Mg Caps (Clidinium-chlordiazepoxide) .... Take 1 tablet by mouth two times a day as needed 10)  Proair Hfa 108 (90 Base) Mcg/act Aers (Albuterol sulfate) .... Inhale1- 2 puffs by mouth every 4 -6 hours as needed 11)  Xanax 0.5 Mg Tabs (Alprazolam) .... Take 1 tablet by mouth once a day as needed 12)  Qvar 80 Mcg/act Aers (Beclomethasone dipropionate) .... 2 puffs two times a day 13)   Citalopram Hydrobromide 20 Mg Tabs (Citalopram hydrobromide) .... One by mouth once daily  Patient Instructions: 1)  Please schedule a follow-up appointment in 2 months. Prescriptions: CITALOPRAM HYDROBROMIDE 20 MG TABS (CITALOPRAM HYDROBROMIDE) one by mouth once daily  #30 x 2   Entered and Authorized by:   D. Thomos Lemons DO   Signed by:   D. Thomos Lemons DO on 11/10/2009   Method used:   [facility]        [facility]  [place] [address] [facility]       [address]       [place], [place]  [address]       Ph: [phone] or [phone]       Fax: [phone]   RxID:   [id number]   Current Allergies (reviewed today): EFFEXOR XR (VENLAFAXINE HCL)

## 2010-07-26 NOTE — Progress Notes (Signed)
Summary: Medication Clarification  Phone Note Call from Patient Call back at Home Phone [phone]   Caller: Patient Call For: D. Thomos Lemons DO Summary of Call: patient called and left voice message stating she wanting to make sure she is taking her medication right. She states she has been taking the Effexor tiwice a day, and she does not feel great, she is fatigued, and nausea, and tired.  call returned to patient at [phone] no answer, voice message left for patient to return call Initial call taken by: Glendell Docker CMA,  Nov 07, 2009 9:17 AM  Follow-up for Phone Call        currently taking Effexor two tablets by mouth once daily , and she states she just does not feel quite right, she has an increase in sweats, but does not feel the medication is helping any. Patient was advised to schedule follow up appointment to discuss medication problems with Dr Artist Pais. She has scheduled for Friday 5/20 @ 2:15 pm Follow-up by: Glendell Docker CMA,  Nov 07, 2009 4:30 PM

## 2010-07-26 NOTE — Progress Notes (Signed)
Summary: RX Librax  Phone Note Call from Patient Call back at Home Phone [phone]   Caller: Patient Call For: D. Thomos Lemons DO Summary of Call: patient called and left voice message requesting rx refill on Librax. She states her irritable bowel has started to give her problems since starting the Citalopram. She states she is not sure whether the medication is causing the irritation,but she has noticed that it has been bothersome since the medication change Initial call taken by: Glendell Docker CMA,  November 27, 2009 4:27 PM  Follow-up for Phone Call        rx sent to pharm. if IBS symptoms do not improve within 1-2 weeks, please instruct pt to call office Follow-up by: D. Thomos Lemons DO,  November 27, 2009 5:18 PM  Additional Follow-up for Phone Call Additional follow up Details #1::        attempted to contact patient at [phone], no answer, detailed voice message left informing patient per Dr Artist Pais instructions Additional Follow-up by: Glendell Docker CMA,  November 28, 2009 8:01 AM    Prescriptions: LIBRAX 2.5-5 MG CAPS (CLIDINIUM-CHLORDIAZEPOXIDE) Take 1 tablet by mouth two times a day as needed  #60 x 1   Entered and Authorized by:   D. Thomos Lemons DO   Signed by:   D. Thomos Lemons DO on 11/27/2009   Method used:   [facility]        [facility]  [place] [address] [facility]       [address]       [place], [place]  [address]       Ph: [phone] or [phone]       Fax: [phone]   RxID:   [id number]

## 2010-07-26 NOTE — Progress Notes (Signed)
Summary: Alprazolam Refill  Phone Note Refill Request Message from:  Fax from Pharmacy on December 13, 2009 12:36 PM  Refills Requested: Medication #1:  Alprazolam 0.5mg  1/2 to 1 tablet by mouth once a day prn   Dosage confirmed as above?Dosage Confirmed   Brand Name Necessary? No   Supply Requested: 1 month   [facility]2009 [facility]   Method Requested: Telephone to Pharmacy Next Appointment Scheduled: None Initial call taken by: Glendell Docker CMA,  December 13, 2009 12:37 PM  Follow-up for Phone Call        ok to refill x 3 Follow-up by: D. Thomos Lemons DO,  December 14, 2009 5:40 PM  Additional Follow-up for Phone Call Additional follow up Details #1::        Rx called to pharmacy, patient aware, voice message left Additional Follow-up by: Glendell Docker CMA,  December 15, 2009 3:37 PM    Prescriptions: XANAX 0.5 MG TABS (ALPRAZOLAM) Take 1 tablet by mouth once a day as needed  #30 x 3   Entered by:   Glendell Docker CMA   Authorized by:   D. Thomos Lemons DO   Signed by:   Glendell Docker CMA on 12/15/2009   Method used:   Telephoned to ...       [facility]  [place] [address] (retail)       [address]       [place], [place]  [address]       Ph: [phone] or [phone]       Fax: [phone]   RxID:   [id number]

## 2010-07-26 NOTE — Progress Notes (Signed)
Summary: Symbicort replacement?  Phone Note Call from Patient   Caller: Patient Call For: D. Thomos Lemons DO Details for Reason: Side effects from med Summary of Call: Mallory [name] seen on Sept  12 given rx for Symbicort Inhaler , Mallory [name] had a number of side effect from medication   ,   gitterery, nervous, made cracking in chest  she stop medication ,please call patient   [phone] Initial call taken by: Darral Dash,  March 13, 2010 1:19 PM  Follow-up for Phone Call        Mallory [name] states her symptoms did not get better on Symbicort. Crackling in throat and chest sounded worse, Mallory [name] felt irritable while on medication. Has stopped med last Thursday and crackling seems better.  States her cough is better but is still a little short of breath when she talks. Used to take Singulair in the past and wanted to know if she should go back on that? Please advise. Nicki Guadalajara Fergerson CMA Duncan Dull)  March 13, 2010 5:46 PM   Additional Follow-up for Phone Call Additional follow up Details #1::        I suggest we try to change symbicort to qvar - see rx Additional Follow-up by: D. Thomos Lemons DO,  March 14, 2010 8:24 AM    Additional Follow-up for Phone Call Additional follow up Details #2::    call returned to patient at [phone], she has been advised per Dr Artist Pais instructions Follow-up by: Glendell Docker CMA,  March 14, 2010 11:57 AM  New/Updated Medications: QVAR 80 MCG/ACT AERS (BECLOMETHASONE DIPROPIONATE) 2 puffs two times a day Prescriptions: QVAR 80 MCG/ACT AERS (BECLOMETHASONE DIPROPIONATE) 2 puffs two times a day  #1 x 2   Entered and Authorized by:   D. Thomos Lemons DO   Signed by:   D. Thomos Lemons DO on 03/14/2010   Method used:   [facility]        [facility]  [place] [address]       [address]       [place], [place]  [address]       Ph: [phone] or [phone]       Fax: [phone]   RxID:   [id number]

## 2010-07-26 NOTE — Progress Notes (Signed)
Summary: Rx cough Syrup  Phone Note Call from Patient   Caller: Patient Call For: D. Thomos Lemons DO Summary of Call: patient called and left voice message requesting a rx for Tussinex. She states she has a tickle in her throat, and that has caused her to cough all night long. She would also like for Dr Artist Pais to know that she has been taking Claritin over the counter and in the past she has had a rx for Clarinex. She wanted to know what Dr Artist Pais thoughts were about her going back to the Clarinex Initial call taken by: Glendell Docker CMA,  February 27, 2010 5:08 PM  Follow-up for Phone Call        call was returned to patient at [phone], she was advised per Dr Artist Pais that she should try saline rinses and Allegra. If there was no improvement she will need to return for evaluation. She states she has tried the Allegra in the past, and that has not work for her. She was informed if she felt like she needed something stronger rx wise, a office visit is needed. The rx for Tussinex would not be called into the pharmacy. Patient verablized understanding and agrees.  Follow-up by: Glendell Docker CMA,  February 27, 2010 5:11 PM

## 2010-07-26 NOTE — Progress Notes (Signed)
Summary: Fluticasone  Phone Note Refill Request Message from:  Patient on September 01, 2009 8:48 AM  Refills Requested: Medication #1:  FLUTICASONE PROPIONATE 50 MCG/ACT SUSP 2 sprays each nostril once daily   Dosage confirmed as above?Dosage Confirmed   Brand Name Necessary? No   Supply Requested: 1 month HSE GETS HER MEDS FROM [facility].  IT WILL BE 10 DAYS BEFORE SHE GETS HER SPRAY AND SHE WILL BE OUT BEFORE THEN.  [facility]    Method Requested: Electronic Next Appointment Scheduled: NONE Initial call taken by: Roselle Locus,  September 01, 2009 8:50 AM    Prescriptions: FLUTICASONE PROPIONATE 50 MCG/ACT SUSP (FLUTICASONE PROPIONATE) 2 sprays each nostril once daily  #1 x 1   Entered by:   Glendell Docker CMA   Authorized by:   D. Thomos Lemons DO   Signed by:   Glendell Docker CMA on 09/01/2009   Method used:   [facility]        [facility]  [place] [address] [facility]       [address]       [place], [place]  [address]       Ph: [phone] or [phone]       Fax: [phone]   RxID:   [id number]

## 2010-07-26 NOTE — Assessment & Plan Note (Signed)
Summary: 1 MONTH FOLLOW UP/MHF   Vital Signs:  Patient profile:   45 year old female Menstrual status:  regular Height:      71.5 inches Weight:      257.75 pounds BMI:     35.58 O2 Sat:      100 % on Room air Temp:     97.7 degrees F oral Pulse rate:   84 / minute Pulse rhythm:   regular Resp:     18 per minute BP sitting:   120 / 70  (left arm) Cuff size:   large  Vitals Entered By: Glendell Docker CMA (April 05, 2010 3:35 PM)  O2 Flow:  Room air CC: 1 month follow up  Is Patient Diabetic? No Pain Assessment Patient in pain? no      Comments follow up on pulmonary test, discuss blood work for cholesterol   Primary Care Provider:  D. Thomos Lemons DO  CC:  1 month follow up .  History of Present Illness: 45 y/o white female for f/u PFTs revieweed she reports intermittent cough  3 dogs at home cocker spaniel mixer "dorky mix"  she had allergy testing in the past multiple allergens dust, pollen   Preventive Screening-Counseling & Management  Alcohol-Tobacco     Smoking Status: never  Allergies: 1)  Effexor Xr (Venlafaxine Hcl)  Past History:  Past Medical History: Possible Asthma -95 Anxiety / Depression- no counseling GERD (hiatal hernia)  Allergic rhinitis-year round     Hyperlipidemia 2002    Irritable bowel Syndrome 2008   Interstial Cystitis- 2006- Dr Marcine Matar Hx of pulmonary nodule - 09/14/2008  (Incidental finding on CT of Chest ordered for chest pain)  Family History: Family History of Alcoholism/Addiction Family History of Arthritis-paternal grandmother Family History Breast cancer - mother Family History of Colon CA maternal aunt Family History Diabetes mother  Family History High cholesterol- mother Family History Hypertension-mother           Social History: Occupation: unemployed Married 21 years 1 son 73   1 daughter 48        Never Smoked Smoking Status:  never  Physical Exam  General:  alert and  overweight-appearing.   Lungs:  normal respiratory effort, normal breath sounds Heart:  normal rate, regular rhythm, and no gallop.     Impression & Recommendations:  Problem # 1:  ALLERGIC RHINITIS (ICD-477.9)  The following medications were removed from the medication list:    Astelin 137 Mcg/spray Soln (Azelastine hcl) .Marland Kitchen... 2 sprays each nostril once daily Her updated medication list for this problem includes:    Fexofenadine Hcl 180 Mg Tabs (Fexofenadine hcl) ..... One by mouth once daily    Fluticasone Propionate 50 Mcg/act Susp (Fluticasone propionate) .Marland Kitchen... 2 sprays each nostril once daily  Discussed use of allergy medications and environmental measures.   Problem # 2:  GERD (ICD-530.81) Assessment: Improved  Her updated medication list for this problem includes:    Omeprazole-sodium Bicarbonate 40-1100 Mg Caps (Omeprazole-sodium bicarbonate) .Marland Kitchen... Take 1 capsule by mouth once a day    Librax 2.5-5 Mg Caps (Clidinium-chlordiazepoxide) .Marland Kitchen... Take 1 tablet by mouth two times a day as needed  Complete Medication List: 1)  Fexofenadine Hcl 180 Mg Tabs (Fexofenadine hcl) .... One by mouth once daily 2)  Fluticasone Propionate 50 Mcg/act Susp (Fluticasone propionate) .... 2 sprays each nostril once daily 3)  Simvastatin 40 Mg Tabs (Simvastatin) .... Take 1 tablet by mouth once a day 4)  Omeprazole-sodium Bicarbonate 40-1100  Mg Caps (Omeprazole-sodium bicarbonate) .... Take 1 capsule by mouth once a day 5)  Junel 1/20 1-20 Mg-mcg Tabs (Norethindrone acet-ethinyl est) .... Take 1 tablet by mouth once a day 6)  Align Caps (Probiotic product) .... Take 1 tablet by mouth once a day 7)  Multivitamins Tabs (Multiple vitamin) .... Take 1 tablet by mouth once a day 8)  Librax 2.5-5 Mg Caps (Clidinium-chlordiazepoxide) .... Take 1 tablet by mouth two times a day as needed 9)  Proair Hfa 108 (90 Base) Mcg/act Aers (Albuterol sulfate) .... Inhale1- 2 puffs by mouth every 4 -6 hours as  needed 10)  Xanax 0.5 Mg Tabs (Alprazolam) .... Take 1 tablet by mouth once a day as needed 11)  Sertraline Hcl 50 Mg Tabs (Sertraline hcl) .... One by mouth once daily  Patient Instructions: 1)  Please schedule a follow-up appointment in 6 months. 2)  BMP prior to visit, ICD-9:  272.4 3)  Lipid Panel prior to visit, ICD-9: 272.4 4)  TSH prior to visit, ICD-9: 272.4 5)  LFTs:  272.4 6)  Please return for lab work one (1) week before your next appointment.  7)  Limit your calories to 1200 cal per day. Prescriptions: OMEPRAZOLE-SODIUM BICARBONATE 40-1100 MG CAPS (OMEPRAZOLE-SODIUM BICARBONATE) Take 1 capsule by mouth once a day  #90 x 1   Entered and Authorized by:   D. Thomos Lemons DO   Signed by:   D. Thomos Lemons DO on 04/05/2010   Method used:   [facility]        [facility]  [place] [address] [facility]       [address]       [place], [place]  [address]       Ph: [phone] or [phone]       Fax: [phone]   RxID:   [id number] SIMVASTATIN 40 MG TABS (SIMVASTATIN) Take 1 tablet by mouth once a day  #90 x 1   Entered and Authorized by:   D. Thomos Lemons DO   Signed by:   D. Thomos Lemons DO on 04/05/2010   Method used:   [facility]        [facility]  [place] [address] [facility]       [address]       [place], [place]  [address]       Ph: [phone] or [phone]       Fax: [phone]   RxID:   [id number]   Current Allergies (reviewed today): EFFEXOR XR (VENLAFAXINE HCL)      Preventive Care Screening  Mammogram:    Date:  05/15/2009    Results:  normal   Pap Smear:    Date:  05/15/2009    Results:  normal

## 2010-07-26 NOTE — Assessment & Plan Note (Signed)
Summary: ? uti - jr   Vital Signs:  Patient profile:   45 year old female Menstrual status:  regular Weight:      252.75 pounds BMI:     34.89 O2 Sat:      100 % on Room air Temp:     97.5 degrees F oral Pulse rate:   80 / minute Pulse rhythm:   regular Resp:     16 per minute BP sitting:   120 / 70  (left arm) Cuff size:   large  Vitals Entered By: Glendell Docker CMA (August 29, 2009 4:19 PM)  O2 Flow:  Room air CC: Urinary frequency, Dysuria Comments for the past 2 weeks c/o urinary frequency, burning with urination, lower abdominal cramping, and nausea. She states since Januarr her taste has changed and has increased the amount of her salt intake to taste food. Mailorder medication- need generics for asmanex, clarinex, singulair and nasonex   Primary Care Provider:  Dondra Spry DO  CC:  Urinary frequency and Dysuria.  History of Present Illness: Dysuria      This is a 45 year old woman who presents with Dysuria.  The patient reports burning with urination and urinary frequency.  The patient denies the following associated symptoms: nausea, vomiting, fever, shaking chills, and flank pain.    she has hx of interstitial cystitis followed by urology hx of freq UTIs she was prev on macrobid for suppressive therapy  asthma - cough resolved.  denies wheezing or sob.  asmanex is cost prohibitive  chronic allergies - she is allergic to dust mites.  symptoms worse at night  Allergies: 1)  Effexor Xr (Venlafaxine Hcl)  Past History:  Past Medical History: Asthma -95 Anxiety / Depression- no counseling GERD (hiatal hernia)  Allergic rhinitis-year round   Hyperlipidemia 2002   Irritable bowel Syndrome 2008 Interstial Cystitis- 2006- Dr Marcine Matar Hx of pulmonary nodule - 09/14/2008  (Incidental finding on CT of Chest ordered for chest pain)  Past Surgical History: kidney stone 1986 GYN surgery 2002- D&C         Family History: Family History of  Alcoholism/Addiction Family History of Arthritis-paternal grandmother Family History Breast cancer - mother Family History of Colon CA maternal aunt Family History Diabetes mother  Family History High cholesterol- mother Family History Hypertension-mother        Social History: Occupation: unemployed Married 21 years 1 son 71   1 daughter 60     Physical Exam  General:  alert and overweight-appearing.   Lungs:  normal respiratory effort and normal breath sounds.   Heart:  normal rate, regular rhythm, and no gallop.   Abdomen:  soft.  mild suprapubic tenderness,  no flank tenderness Psych:  normally interactive, good eye contact, not anxious appearing, and not depressed appearing.     Impression & Recommendations:  Problem # 1:  UTI (ICD-599.0) 45 y/o white female with hx of interstitial cystitis exp urinary freq and dysuria.  tx with ceftin. Patient advised to call office if symptoms persist or worsen.  The following medications were removed from the medication list:    Augmentin 500-125 Mg Tabs (Amoxicillin-pot clavulanate) ..... One tablet by mouth two times a day x 10 days Her updated medication list for this problem includes:    Cefuroxime Axetil 500 Mg Tabs (Cefuroxime axetil) ..... One by mouth bid  Problem # 2:  ALLERGIC RHINITIS (ICD-477.9) Pt would like to streamline meds and use generics if possible  Her  updated medication list for this problem includes:    Loratadine 10 Mg Tabs (Loratadine) ..... One by mouth qd    Fluticasone Propionate 50 Mcg/act Susp (Fluticasone propionate) .Marland Kitchen... 2 sprays each nostril once daily    Astelin 137 Mcg/spray Soln (Azelastine hcl) .Marland Kitchen... 2 sprays each nostril once daily  Discussed use of allergy medications and environmental measures.   Problem # 3:  ASTHMA (ICD-493.90) Use Qvar instead of asmanex.  taper to Qvar 40 mg if good control at next ov The following medications were removed from the medication list:    Singulair 10 Mg  Tabs (Montelukast sodium) .Marland Kitchen... Take 1 tablet by mouth once a day Her updated medication list for this problem includes:    Ventolin Hfa 108 (90 Base) Mcg/act Aers (Albuterol sulfate) .Marland Kitchen... 2 puffs by mouth once daily as needed    Proair Hfa 108 (90 Base) Mcg/act Aers (Albuterol sulfate) ..... Inhale1- 2 puffs by mouth every 4 -6 hours as needed    Qvar 80 Mcg/act Aers (Beclomethasone dipropionate) .Marland Kitchen... 2 puffs two times a day  Pulmonary Functions Reviewed: O2 sat: 100 (08/29/2009)  Problem # 4:  DEPRESSION (ICD-311) switch to fluoxetine.  she could not tolerate effexor.  we discussed tapering off prozac.  she is concerned SSRI has caused most of her wt gain. Her updated medication list for this problem includes:    Fluoxetine Hcl 20 Mg Tabs (Fluoxetine hcl) ..... One by mouth once daily    Xanax 0.5 Mg Tabs (Alprazolam) .Marland Kitchen... Take 1 tablet by mouth once a day as needed  Complete Medication List: 1)  Loratadine 10 Mg Tabs (Loratadine) .... One by mouth qd 2)  Fluticasone Propionate 50 Mcg/act Susp (Fluticasone propionate) .... 2 sprays each nostril once daily 3)  Astelin 137 Mcg/spray Soln (Azelastine hcl) .... 2 sprays each nostril once daily 4)  Fenofibrate 160 Mg Tabs (Fenofibrate) .... Take 1 tablet by mouth once a day 5)  Simvastatin 40 Mg Tabs (Simvastatin) .... Take 1 tablet by mouth once a day 6)  Omeprazole-sodium Bicarbonate 40-1100 Mg Caps (Omeprazole-sodium bicarbonate) .... One by mouth two times a day 7)  Junel 1/20 1-20 Mg-mcg Tabs (Norethindrone acet-ethinyl est) .... Take 1 tablet by mouth once a day 8)  Fluoxetine Hcl 20 Mg Tabs (Fluoxetine hcl) .... One by mouth once daily 9)  Align Caps (Probiotic product) .... Take 1 tablet by mouth once a day 10)  Multivitamins Tabs (Multiple vitamin) .... Take 1 tablet by mouth once a day 11)  Ventolin Hfa 108 (90 Base) Mcg/act Aers (Albuterol sulfate) .... 2 puffs by mouth once daily as needed 12)  Librax 2.5-5 Mg Caps  (Clidinium-chlordiazepoxide) .... Take 1 tablet by mouth two times a day as needed 13)  Proair Hfa 108 (90 Base) Mcg/act Aers (Albuterol sulfate) .... Inhale1- 2 puffs by mouth every 4 -6 hours as needed 14)  Xanax 0.5 Mg Tabs (Alprazolam) .... Take 1 tablet by mouth once a day as needed 15)  Qvar 80 Mcg/act Aers (Beclomethasone dipropionate) .... 2 puffs two times a day 16)  Cefuroxime Axetil 500 Mg Tabs (Cefuroxime axetil) .... One by mouth bid  Patient Instructions: 1)  Please schedule a follow-up appointment in 2 months. Prescriptions: CEFUROXIME AXETIL 500 MG TABS (CEFUROXIME AXETIL) one by mouth bid  #14 x 0   Entered and Authorized by:   D. Thomos Lemons DO   Signed by:   D. Thomos Lemons DO on 08/29/2009   Method used:   [facility]  [place] [address] [facility]       [address]       [place], [place]  [address]       Ph: [phone] or [phone]       Fax: [phone]   RxID:   [id number] FLUOXETINE HCL 20 MG TABS (FLUOXETINE HCL) one by mouth once daily  #90 x 3   Entered and Authorized by:   D. Thomos Lemons DO   Signed by:   D. Thomos Lemons DO on 08/29/2009   Method used:   Electronically to        [facility] (mail-order)       [facility]       [address]       [place], [place]  [address]       Ph: [phone]       Fax: [phone]   RxID:   [id number] QVAR 80 MCG/ACT AERS (BECLOMETHASONE DIPROPIONATE) 2 puffs two times a day  #3 x 3   Entered and Authorized by:   D. Thomos Lemons DO   Signed by:   D. Thomos Lemons DO on 08/29/2009   Method used:   Electronically to        [facility] (mail-order)       [facility]       [address]       [place], [place]  [address]       Ph: [phone]       Fax: [phone]   RxID:   [id number] FLUTICASONE PROPIONATE 50 MCG/ACT SUSP (FLUTICASONE PROPIONATE) 2 sprays each nostril once daily  #3 x 4   Entered and Authorized by:   D. Thomos Lemons  DO   Signed by:   D. Thomos Lemons DO on 08/29/2009   Method used:   Electronically to        [facility] (mail-order)       [facility]       [address]       [place], [place]  [address]       Ph: [phone]       Fax: [phone]   RxID:   [id number] QVAR 80 MCG/ACT AERS (BECLOMETHASONE DIPROPIONATE) 2 puffs two times a day  #3 x 3   Entered and Authorized by:   D. Thomos Lemons DO   Signed by:   D. Thomos Lemons DO on 08/29/2009   Method used:   Electronically to        [facility]* (mail-order)             ,          Ph: [phone]       Fax: [phone]   RxID:   [id number] FLUTICASONE PROPIONATE 50 MCG/ACT SUSP (FLUTICASONE PROPIONATE) 2 sprays each nostril once daily  #3 x 4   Entered and Authorized by:   D. Thomos Lemons DO   Signed by:   D. Thomos Lemons DO on 08/29/2009   Method used:   Electronically to        [facility]* (mail-order)             ,          Ph: [phone]       Fax: [phone]   RxID:   [id number]   Laboratory Results   Urine Tests    Routine Urinalysis   Color: yellow Appearance:  Cloudy Glucose: negative   (Normal Range: Negative) Bilirubin: negative   (Normal Range: Negative) Ketone: negative   (Normal Range: Negative) Spec. Gravity: 1.015   (Normal Range: 1.003-1.035) Blood: moderate   (Normal Range: Negative) pH: 6.0   (Normal Range: 5.0-8.0) Protein: trace   (Normal Range: Negative) Urobilinogen: 0.2   (Normal Range: 0-1) Nitrite: negative   (Normal Range: Negative) Leukocyte Esterace: moderate   (Normal Range: Negative)

## 2010-07-26 NOTE — Progress Notes (Signed)
Summary: Medication Update  Phone Note Call from Patient Call back at [phone]   Caller: Patient Call For: D. Thomos Lemons DO Summary of Call: voice message left from patient requesting a rx for Effexor. She states she has been taking Prozac which was swicthed from Zoloft.  Call was returned to patient at [phone], no answer, voice message left for patient to call back with clarification of her call Initial call taken by: Glendell Docker CMA,  October 16, 2009 3:17 PM  Follow-up for Phone Call        patient returned call and states she was given a rx for prozac for a month  and has tried the Prozac and she is ready to transition to the  to the  Effexor Rx. She would like to know how she is take the rx when called in. Follow-up by: Glendell Docker CMA,  October 16, 2009 5:02 PM  Additional Follow-up for Phone Call Additional follow up Details #1::        when she starts effexor take 1/2 of prozac x 2 weeks, then stop when she start taking 2 tabs of effexor.    needs f/u appt within 2-4 weeks of changing medication Additional Follow-up by: D. Thomos Lemons DO,  October 17, 2009 1:13 PM    Additional Follow-up for Phone Call Additional follow up Details #2::    attempted to contact patient at [phone], no answer, detailed voice message left informing patient per Dr Artist Pais instructions. Patient was advised to call and schedule follow up appointment Follow-up by: Glendell Docker CMA,  October 17, 2009 2:58 PM  New/Updated Medications: VENLAFAXINE HCL 37.5 MG XR24H-CAP (VENLAFAXINE HCL) one cap by mouth once daily x 2 weeks, then 2 by mouth once daily Prescriptions: VENLAFAXINE HCL 37.5 MG XR24H-CAP (VENLAFAXINE HCL) one cap by mouth once daily x 2 weeks, then 2 by mouth once daily  #60 x 2   Entered and Authorized by:   D. Thomos Lemons DO   Signed by:   D. Thomos Lemons DO on 10/17/2009   Method used:   [facility]  [place] [address]       [address]       [place],  [place]  [address]       Ph: [phone] or [phone]       Fax: [phone]   RxID:   [id number]

## 2010-07-26 NOTE — Consult Note (Signed)
Summary: [facility]  [facility]   Imported By: Lanelle Bal 06/01/2010 12:26:50  _____________________________________________________________________  External Attachment:    Type:   Image     Comment:   External Document

## 2010-08-02 ENCOUNTER — Encounter: Payer: Self-pay | Admitting: Internal Medicine

## 2010-10-04 LAB — URINALYSIS, ROUTINE W REFLEX MICROSCOPIC
Glucose, UA: NEGATIVE mg/dL
Hgb urine dipstick: NEGATIVE
Ketones, ur: NEGATIVE mg/dL
Protein, ur: NEGATIVE mg/dL
pH: 6 (ref 5.0–8.0)

## 2010-10-04 LAB — POCT CARDIAC MARKERS
CKMB, poc: <1 ng/mL — ABNORMAL LOW (ref 1.0–8.0)
CKMB, poc: <1 ng/mL — ABNORMAL LOW (ref 1.0–8.0)
Myoglobin, poc: 50.2 ng/mL (ref 12–200)
Troponin i, poc: <0.05 ng/mL (ref 0.00–0.09)

## 2010-10-04 LAB — URINE MICROSCOPIC-ADD ON

## 2010-10-04 LAB — COMPREHENSIVE METABOLIC PANEL
ALT: 18 U/L (ref 0–35)
AST: 27 U/L (ref 0–37)
Alkaline Phosphatase: 174 U/L — ABNORMAL HIGH (ref 39–117)
CO2: 27 mEq/L (ref 19–32)
Calcium: 9.1 mg/dL (ref 8.4–10.5)
Chloride: 104 mEq/L (ref 96–112)
Creatinine, Ser: 0.9 mg/dL (ref 0.4–1.2)
GFR calc Af Amer: >60 mL/min (ref >60)
Glucose, Bld: 98 mg/dL (ref 70–99)
Potassium: 4.7 mEq/L (ref 3.5–5.1)
Total Bilirubin: 0.5 mg/dL (ref 0.3–1.2)

## 2010-10-04 LAB — D-DIMER, QUANTITATIVE: D-Dimer, Quant: <0.22 ug/mL-FEU (ref 0.00–0.48)

## 2010-10-04 LAB — DIFFERENTIAL
Basophils Absolute: 0 10*3/uL (ref 0.0–0.1)
Basophils Relative: 1 % (ref 0–1)
Eosinophils Absolute: 0.2 10*3/uL (ref 0.0–0.7)
Eosinophils Relative: 2 % (ref 0–5)
Lymphs Abs: 2.6 10*3/uL (ref 0.7–4.0)

## 2010-10-04 LAB — CBC: RDW: 13.5 % (ref 11.5–15.5)

## 2010-10-08 ENCOUNTER — Ambulatory Visit: Payer: Self-pay | Admitting: Internal Medicine

## 2010-10-23 ENCOUNTER — Telehealth: Payer: Self-pay | Admitting: Family Medicine

## 2010-10-23 NOTE — Telephone Encounter (Signed)
Ok with me 

## 2010-10-23 NOTE — Telephone Encounter (Signed)
Patient is wanting to change PCP's because of location.

## 2010-10-23 NOTE — Telephone Encounter (Signed)
OK with me since OK with PMD

## 2010-10-24 ENCOUNTER — Ambulatory Visit (INDEPENDENT_AMBULATORY_CARE_PROVIDER_SITE_OTHER): Payer: Private Health Insurance - Indemnity | Admitting: Family Medicine

## 2010-10-24 ENCOUNTER — Encounter: Payer: Self-pay | Admitting: Family Medicine

## 2010-10-24 VITALS — BP 149/84 | HR 79 | Temp 97.7°F | Ht 71.5 in | Wt 252.8 lb

## 2010-10-24 DIAGNOSIS — F329 Major depressive disorder, single episode, unspecified: Secondary | ICD-10-CM

## 2010-10-24 DIAGNOSIS — IMO0001 Reserved for inherently not codable concepts without codable children: Secondary | ICD-10-CM

## 2010-10-24 DIAGNOSIS — R21 Rash and other nonspecific skin eruption: Secondary | ICD-10-CM

## 2010-10-24 DIAGNOSIS — E669 Obesity, unspecified: Secondary | ICD-10-CM | POA: Insufficient documentation

## 2010-10-24 DIAGNOSIS — J370 Chronic laryngitis: Secondary | ICD-10-CM

## 2010-10-24 DIAGNOSIS — K589 Irritable bowel syndrome without diarrhea: Secondary | ICD-10-CM | POA: Insufficient documentation

## 2010-10-24 DIAGNOSIS — M542 Cervicalgia: Secondary | ICD-10-CM

## 2010-10-24 DIAGNOSIS — J3489 Other specified disorders of nose and nasal sinuses: Secondary | ICD-10-CM

## 2010-10-24 DIAGNOSIS — F411 Generalized anxiety disorder: Secondary | ICD-10-CM

## 2010-10-24 DIAGNOSIS — J984 Other disorders of lung: Secondary | ICD-10-CM

## 2010-10-24 DIAGNOSIS — M797 Fibromyalgia: Secondary | ICD-10-CM

## 2010-10-24 DIAGNOSIS — E785 Hyperlipidemia, unspecified: Secondary | ICD-10-CM

## 2010-10-24 DIAGNOSIS — K219 Gastro-esophageal reflux disease without esophagitis: Secondary | ICD-10-CM

## 2010-10-24 DIAGNOSIS — J309 Allergic rhinitis, unspecified: Secondary | ICD-10-CM

## 2010-10-24 MED ORDER — SULFAMETHOXAZOLE-TRIMETHOPRIM 800-160 MG PO TABS: 1.0000 | ORAL_TABLET | Freq: Two times a day (BID) | ORAL | Status: AC

## 2010-10-24 MED ORDER — CYCLOBENZAPRINE HCL 10 MG PO TABS: 10.0000 mg | ORAL_TABLET | Freq: Three times a day (TID) | ORAL | Status: DC | PRN

## 2010-10-24 MED ORDER — BACITRACIN ZINC 500 UNIT/GM EX OINT: TOPICAL_OINTMENT | CUTANEOUS | Status: DC

## 2010-10-24 NOTE — Assessment & Plan Note (Signed)
She feels it is well treated on Zoloft but she has always hoped to come off the medication thinking it is contributing to her weight problems. Unfortunately every time she tries she gets irrritable or more depressed.

## 2010-10-24 NOTE — Assessment & Plan Note (Signed)
Suspicious for a staph lesion. Culture taken today, Septra DS bid started and she will apply H2O2 and Bacitracin qhs. Report worsening symptoms

## 2010-10-24 NOTE — Assessment & Plan Note (Signed)
Avoid trans fats, encouraged her to consider starting Schiff Megared caps 1 daily, check FLP prior to next visit.

## 2010-10-24 NOTE — Assessment & Plan Note (Signed)
Reviewed history LUL nodule has been stable since 2010, f/u has consisted of 3 CT scans, radiology recommended final CT in 3/12 which needs to be done, if this is unchanged then we will back off on surveillance. Patient agrees that we will order her CT scan when she returns next month for follow up appt. Patient is asymptomatic in this regard

## 2010-10-24 NOTE — Assessment & Plan Note (Signed)
Improved with Xyzal and Flonase may cont same or try Cetirizine up to bid

## 2010-10-24 NOTE — Patient Instructions (Signed)
Follicular lesion in nose Folliculitis is an infection and inflammation of the hair follicles. Hair follicles become red and irritated. This inflammation is usually caused by bacteria. The bacteria thrive in warm, moist environments. This condition can be seen anywhere on the body.  CAUSES AND RISK FACTORS The most common cause of folliculitis is an infection by germs (bacteria). Fungal and viral infections can also cause the condition. Viral infections may be more common in people whose bodies are unable to fight disease well (weakened immune systems). Examples include people with:  AIDS.   An organ transplant.   Cancer.  People with depressed immune systems, diabetes, or obesity, have a greater risk of getting folliculitis than the general population. Certain chemicals, especially oils and tars, also can cause folliculitis. SYMPTOMS  An early sign of folliculitis is a small, white or yellow pus-filled, itchy lesion (pustule). These lesions appear on a red, inflamed follicle. They are usually less than 5 mm (.20 inches).   The most likely starting points are the scalp, thighs, legs, back and buttocks. Folliculitis is also frequently found in areas of repeated shaving.   When an infection of the follicle goes deeper, it becomes a boil or furuncle. A group of closely packed boils create a larger lesion (a carbuncle). These sores (lesions) tend to occur in hairy, sweaty areas of the body.  TREATMENT  A doctor who specializes in skin problems (dermatologists) treats mild cases of folliculitis with antiseptic washes.   They also use a skin application which kills germs (topical antibiotics). Tea tree oil is a good topical antiseptic as well. It can be found at a health food store. A small percentage of individuals may develop an allergy to the tea tree oil.   Mild to moderate boils respond well to warm water compresses applied three times daily.   In some cases, oral antibiotics should be taken  with the skin treatment.   If lesions contain large quantities of pus or fluid, your caregiver may drain them. This allows the topical antibiotics to get to the affected areas better.   Stubborn cases of folliculitis may respond to laser hair removal. This process uses a high intensity light beam (a laser) to destroy the follicle and reduces the scarring from folliculitis. After laser hair removal, hair will no longer grow in the laser treated area.  Patients with long-lasting folliculitis need to find out where the infection is coming from. Germs can live in the nostrils of the patient. This can trigger an outbreak now and then. Sometimes the bacteria live in the nostrils of a family member. This person does not develop the disorder but they repeatedly re-expose others to the germ. To break the cycle of recurrence in the patient, the family member must also undergo treatment. PREVENTION  Individuals who are predisposed to folliculitis should be extremely careful about personal hygiene.   Application of antiseptic washes may help prevent recurrences.   A topical antibiotic cream, mupirocin (Bactroban), has been effective at reducing bacteria in the nostrils. It is applied inside the nose with your little finger. This is done twice daily for a week. Then it is repeated every 6 months.   Because follicle disorders tend to come back, patients must receive follow-up care. Your caregiver may be able to recognize a recurrence before it becomes severe.  SEEK IMMEDIATE MEDICAL CARE IF:  You develop redness, swelling, or increasing pain in the area.   An unexplained oral temperature above 104 develops.   You are  not improving with treatment or are getting worse.   You have any other questions or concerns.  Document Released: 08/19/2001 Document Re-Released: 09/04/2009 [facility] Patient Information 2011 [place], [place].  Cleanse nose with hydrogen peroxide on a qtip daily and then apply Bacitracin  on a QTip to b/l nares at bedtime and can repeat in am  For skin get Cetaphil soap and use daily and avoid heavy perfumes  Consider the DASH diet

## 2010-10-24 NOTE — Assessment & Plan Note (Signed)
Good response to Zegerid only taking it once daily at this time. No changes to meds although she can use Tums or Ranitidine prn for breakthrough symptoms. Avoid offending foods

## 2010-10-24 NOTE — Assessment & Plan Note (Signed)
Improved with treatment of reflux, cont Zegerid

## 2010-10-24 NOTE — Assessment & Plan Note (Signed)
Patient agrees to labs prior to next visit to evaluate thyroid and she is given handouts for the DASH diet to consider. Encouraged small frequent meals with lean proteins and complex carbs, is encouraged to increase exercise.

## 2010-10-24 NOTE — Progress Notes (Signed)
Mallory [name] [id number] Feb 16, 1966 10/24/2010      Progress Note-Follow Up  Subjective  Chief Complaint  Chief Complaint  Patient presents with  . Establish Care    new patient  . Facial Swelling    Left nostril swollen X  3-4 weeks    HPI  Patient is a 45 year old Caucasian female in today for new patient appointment. Complaint today is of some pain and discomfort in her left nostril. She says the symptoms have been present for going on for weeks now she describes the lateral aspect of her left nares is swollen and uncomfortable. She has had some mild epistaxis from the right side whenever A. from the left she does have a cold sore and cracking painful skin at the entrance to the left nares. She does acknowledge nasal congestion. She denies fevers, chills, malaise, myalgias, worsening headaches, ear pain, sore throat, chest pain, palpitations, shortness of breath, GI or GU complaints. She has not tried any medications for the lesion in her nose at this time although in the past she did try a little Neosporin without any improvement. She has a history of incidentally found pulmonary nodule on the left has had a couple of CAT scans which are reviewed with her today it has been stable and a repeat CAT scan last time has been recommended patient agrees to proceed next visit. She denies any trouble with heartburn recently of infiltrates I. depression well treated on Zoloft noting that she does not tolerate SNRIs due to bad dreams and irritation.  Past Medical History  Diagnosis Date  . Anxiety   . Depression     no counseling  . GERD (gastroesophageal reflux disease)   . Allergy     year round  . Hyperlipidemia 2002  . IBS (irritable bowel syndrome) 2008    improved over last couple of years using Align  . Interstitial cystitis 2006    Dr Marcine Matar  . Pulmonary nodule 3/24/20101    Incidental finding of CT Chest ordered for chest pain  . Personal history of kidney stones 1986    . Asthma     mild, intermittent, allergy triggered  . Obesity   . Fibromyalgia 1997    dx by rheumatology, no recent flares  . Hiatal hernia   . Nasal lesion 10/24/2010  . PULMONARY NODULE 02/10/2009  . OBESITY 02/10/2009  . NECK PAIN 12/15/2009  . HYPERLIPIDEMIA 02/10/2009  . GERD 02/10/2009  . DEPRESSION 02/10/2009  . Chronic laryngitis 04/26/2009  . ANXIETY 02/10/2009  . ALLERGIC RHINITIS 02/10/2009  . Rash 10/24/2010    Past Surgical History  Procedure Date  . Kidney stone surgery 1986  . Dilation and curettage of uterus 2002  . Tonsillectomy   . Polypectomy     uterine    Family History  Problem Relation Age of Onset  . Cancer Mother     breast/ ovarian  . Diabetes Mother   . Hyperlipidemia Mother   . Hypertension Mother   . Cancer Maternal Aunt     colon  . Arthritis Paternal Grandmother   . Heart disease Paternal Grandmother   . Stroke Father 28  . Migraines Sister   . Cancer Maternal Grandmother   . Cancer Maternal Grandfather     History   Social History  . Marital Status: Married    Spouse Name: N/A    Number of Children: 2  . Years of Education: N/A   Occupational History  . unemployed  Social History Main Topics  . Smoking status: Never Smoker   . Smokeless tobacco: Never Used  . Alcohol Use: No  . Drug Use: No  . Sexually Active: Yes -- Female partner(s)   Other Topics Concern  . Not on file   Social History Narrative  . No narrative on file    Current Outpatient Prescriptions on File Prior to Visit  Medication Sig Dispense Refill  . albuterol (PROAIR HFA) 108 (90 BASE) MCG/ACT inhaler Inhale 1 -2 puffs by mouth every 4-6 hours as needed.       . ALPRAZolam (XANAX) 0.5 MG tablet Take 0.5 mg by mouth daily as needed.        . bifidobacterium infantis (ALIGN) capsule Take 1 capsule by mouth daily.        . clidinium-chlordiazepoxide (LIBRAX) 2.5-5 MG per capsule Take 1 capsule by mouth 2 (two) times daily. As needed.       . fluticasone  (FLONASE) 50 MCG/ACT nasal spray 2 sprays by Nasal route daily. Each nostril.       Marland Kitchen levocetirizine (XYZAL) 5 MG tablet Take 5 mg by mouth daily.        . norethindrone-ethinyl estradiol (JUNEL FE 1/20) 1-20 MG-MCG per tablet Take 1 tablet by mouth daily.        Marland Kitchen omeprazole-sodium bicarbonate (ZEGERID) 40-1100 MG per capsule Take 1 capsule by mouth daily.        . sertraline (ZOLOFT) 50 MG tablet Take 50 mg by mouth daily.        . simvastatin (ZOCOR) 40 MG tablet Take 40 mg by mouth daily.        . Multiple Vitamin (MULTIVITAMIN) tablet Take 1 tablet by mouth daily.          Allergies  Allergen Reactions  . Venlafaxine     REACTION: bad dreams    Review of Systems  Review of Systems  Constitutional: Negative for fever, chills and malaise/fatigue.  HENT: Positive for congestion. Negative for hearing loss and nosebleeds.        Lesion, tender left nostril, roughly 3 weeks, occasional epistaxis right nares  Eyes: Negative for discharge.  Respiratory: Negative for cough, sputum production, shortness of breath and wheezing.   Cardiovascular: Negative for chest pain, palpitations and leg swelling.  Gastrointestinal: Negative for heartburn, nausea, vomiting, abdominal pain, diarrhea, constipation and blood in stool.  Genitourinary: Negative for dysuria, urgency, frequency and hematuria.  Musculoskeletal: Negative for myalgias, back pain and falls.  Skin: Positive for rash.       On back intermittently, pruritic, red and diffuse when it occurs  Neurological: Negative for dizziness, tremors, sensory change, focal weakness, loss of consciousness, weakness and headaches.  Endo/Heme/Allergies: Negative for polydipsia. Does not bruise/bleed easily.  Psychiatric/Behavioral: Negative for depression and suicidal ideas. The patient is not nervous/anxious and does not have insomnia.     Objective  BP 149/84  Pulse 79  Temp(Src) 97.7 F (36.5 C) (Oral)  Ht 5' 11.5" (1.816 m)  Wt 252 lb 12.8  oz (114.669 kg)  BMI 34.77 kg/m2  SpO2 96%  LMP 10/15/2010  Physical Exam  Physical Exam  Constitutional: She is oriented to person, place, and time and well-developed, well-nourished, and in no distress. No distress.  HENT:  Head: Normocephalic and atraumatic.  Right Ear: External ear normal.  Left Ear: External ear normal.  Mouth/Throat: No oropharyngeal exudate.       Pharynx is erythematous but without exudate or swelling. Nares show boggy  and erythematous nasal mucosa. The right side does have some swelling most notably  on the lateral aspect no obvious lesion. Dry cracked skin at opening to left nares  Eyes: Conjunctivae are normal. Pupils are equal, round, and reactive to light. Right eye exhibits no discharge. Left eye exhibits no discharge. No scleral icterus.  Neck: Normal range of motion. Neck supple. No thyromegaly present.  Cardiovascular: Normal rate, regular rhythm, normal heart sounds and intact distal pulses.   No murmur heard. Pulmonary/Chest: Effort normal and breath sounds normal. No respiratory distress. She has no wheezes. She has no rales.  Abdominal: Soft. Bowel sounds are normal. She exhibits no distension and no mass. There is no tenderness.  Musculoskeletal: Normal range of motion. She exhibits no edema and no tenderness.  Lymphadenopathy:    She has no cervical adenopathy.  Neurological: She is alert and oriented to person, place, and time. She has normal reflexes. No cranial nerve deficit. Coordination normal.  Skin: Skin is warm and dry. No rash noted. She is not diaphoretic.  Psychiatric: Mood, memory and affect normal.    Lab Results  Component Value Date   TSH 0.978 03/31/2009   Lab Results  Component Value Date   WBC 9.7 03/31/2009   HGB 12.2 03/31/2009   HCT 39.0 03/31/2009   MCV 87.1 03/31/2009   PLT 315 03/31/2009   Lab Results  Component Value Date   CREATININE 0.99 03/31/2009   BUN 12 03/31/2009   NA 140 03/31/2009   K 4.4 03/31/2009   CL  104 03/31/2009   CO2 21 03/31/2009   Lab Results  Component Value Date   ALT 14 03/31/2009   AST 17 03/31/2009   ALKPHOS 92 03/31/2009   BILITOT 0.3 03/31/2009   Lab Results  Component Value Date   CHOL 186 03/31/2009   Lab Results  Component Value Date   HDL 39* 03/31/2009   Lab Results  Component Value Date   LDLCALC 117* 03/31/2009   Lab Results  Component Value Date   TRIG 148 03/31/2009   Lab Results  Component Value Date   CHOLHDL 4.8 Ratio 03/31/2009     Assessment & Plan  PULMONARY NODULE Reviewed history LUL nodule has been stable since 2010, f/u has consisted of 3 CT scans, radiology recommended final CT in 3/12 which needs to be done, if this is unchanged then we will back off on surveillance. Patient agrees that we will order her CT scan when she returns next month for follow up appt. Patient is asymptomatic in this regard  OBESITY Patient agrees to labs prior to next visit to evaluate thyroid and she is given handouts for the DASH diet to consider. Encouraged small frequent meals with lean proteins and complex carbs, is encouraged to increase exercise.  Nasal lesion Suspicious for a staph lesion. Culture taken today, Septra DS bid started and she will apply H2O2 and Bacitracin qhs. Report worsening symptoms  IBS (irritable bowel syndrome) Patient reports good improvement with no recent flare since starting Align but did recommend starting Benefiber powder dialy as well  HYPERLIPIDEMIA Avoid trans fats, encouraged her to consider starting Schiff Megared caps 1 daily, check FLP prior to next visit.  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 No changes to meds although she can use Tums or Ranitidine prn for breakthrough symptoms. Avoid  offending foods  DEPRESSION She feels it is well treated on Zoloft but she has always  hoped to come off the medication thinking it is contributing to her weight problems. Unfortunately every time she tries she gets irrritable or more depressed.  CHRONIC LARYNGITIS Improved with treatment of reflux, cont Zegerid  ANXIETY Improved on Zoloft  ALLERGIC RHINITIS Improved with Xyzal and Flonase may cont same or try Cetirizine up to bid  Rash Possible Pilaris not significant presently, try Cetaphil soap and avoid heavy perfumes we will reevaluate at next visit

## 2010-10-24 NOTE — Assessment & Plan Note (Signed)
Patient reports good improvement with no recent flare since starting Align but did recommend starting Benefiber powder dialy as well

## 2010-10-24 NOTE — Assessment & Plan Note (Signed)
Improved on Zoloft.

## 2010-10-24 NOTE — Assessment & Plan Note (Signed)
Possible Pilaris not significant presently, try Cetaphil soap and avoid heavy perfumes we will reevaluate at next visit

## 2010-10-24 NOTE — Assessment & Plan Note (Signed)
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

## 2010-10-27 LAB — WOUND CULTURE
Gram Stain: NONE SEEN
Organism ID, Bacteria: NO GROWTH

## 2010-11-09 NOTE — Op Note (Signed)
[name], [name]                          ACCOUNT NO.:  [id number]   MEDICAL RECORD NO.:  [id number]                   PATIENT TYPE:  AMB   LOCATION:  ENDO                                 FACILITY:  [facility]   PHYSICIAN:  Petra Kuba, M.D.                 DATE OF BIRTH:  1965-12-28   DATE OF PROCEDURE:  05/12/2003  DATE OF DISCHARGE:                                 OPERATIVE REPORT   PROCEDURE:  Esophagogastroduodenoscopy with biopsy.   INDICATIONS FOR PROCEDURE:  A patient with reflux abdominal pain not  responding to the usual medicines. Consent was signed after risks, benefits,  methods, and options were thoroughly discussed in the office.   MEDICINES USED:  Demerol 60, Versed 8.   DESCRIPTION OF PROCEDURE:  The video endoscope was inserted by direct  vision, the proximal mid esophagus was normal. In the distal esophagus was  some minimal esophagitis which was biopsied at the end of the procedure. The  scope passed into the stomach, advanced through a normal antrum, normal  pylorus into a normal duodenal bulb except for some minimal bulbitis and  around the C loop to a normal second portion of the duodenum. The scope was  withdrawn back to the bulb and a good look there ruled out ulcers in that  location. The scope was withdrawn back to the stomach and retroflexed. There  were some tiny distal stomach polyps and two small to medium size soft  movable polyps along the greater curve. A few of the tiny ones were sampled  and multiple biopsies of the small to medium ones in the greater curve were  obtained. The cardia and fundus were normal as was the lesser curve. The  angularis was normal. Straight visualization of the stomach did not reveal  any additional findings. The scope was then slowly withdrawn back to 20 cm.  No additional esophageal findings were seen. Biopsies of the distal  esophagus were obtained at this time, air was suctioned, scope removed. The  patient  tolerated the procedure well. There was on obvious or immediate  complication.   ENDOSCOPIC DIAGNOSIS:  1. Tiny hiatal hernia.  2. Minimal distal esophagitis status post biopsy.  3. Multiple gastric polyps, two small to medium ones on the greater curve, a     few tiny ones distally, polyp hot biopsied.  4. Minimal bulbitis.  5. Otherwise normal esophagogastroduodenoscopy.   PLAN:  Await pathology to determine future endoscopic screening or more  aggressive polypectomy. Go ahead and use 1-2 Aciphex a day as needed.  Followup p.r.n. or in two months to determine any other workup and plans.                                               [name]  E. Magod, M.D.    MEM/MEDQ  D:  05/12/2003  T:  05/12/2003  Job:  [id number]   cc:   [name], M.D.  [address] [place]  [place] [address]  Fax: [phone]

## 2010-11-09 NOTE — H&P (Signed)
[facility]  Patient:    Mallory [name], Mallory [name]                         MRN: [id number] Attending:  Beather Arbour. Thomasena Edis, M.D.                         History and Physical  SOCIAL SECURITY NUMBER:       [id number].  DATE OF BIRTH:                01-31-1966.  HISTORY OF PRESENT ILLNESS:   The patient is a 45 year old para 2, who was found to have a possible polyp in the endometrium when she underwent an ultrasound to rule out an ovarian mass due to continued bloating.  She subsequently underwent a sonohysterogram which showed a 1.2 x 0.5 x 1.1-cm mass consistent with an endometrial polyp; patient also was found to have two small fibroids.  Patient was urged to undergo a D&C/hysteroscopy for removal of the polyp.  Risks of surgery including anesthetic complication, hemorrhage or infection, damage to adjacent structures including bladder, bowel, blood vessels or ureters were discussed with the patient.  She was made aware of the risks of uterine perforation which could result in overwhelming life-threatening hemorrhage requiring emergent hysterectomy or uterine perforation which could result in bowel damage requiring emergent colostomy. She expressed understanding of and acceptance of these risks and is admitted for a D&C/hysteroscopy.  PAST MEDICAL HISTORY:         Significant for possible irritable bowel syndrome, fibromyalgia and history of chronic bladder infections; also, a tonsillectomy at age 89.  Patient had a history of a superficial clot but was not treated with heparin.  ALLERGIES:                    CODEINE and SULFA.  FAMILY HISTORY:               Significant for mother with breast cancer, maternal grandmother with stomach cancer and mother with hypertension.  A paternal grandmother had several MIs.  MEDICATION:                   Ortho-Cyclen.  REVIEW OF SYSTEMS:            Negative.  PHYSICAL EXAMINATION  HEENT:                         Normal.  NECK:                         Supple, without thyromegaly.  LUNGS:                        Clear to auscultation.  CARDIAC:                      Regular rate and rhythm.  ABDOMEN:                      Soft, nontender.  No hepatosplenomegaly.  PELVIC:                       BUS normal.  Normal introitus.  Uterus is midposition, normal size and mobile.  No adnexa mass palpated.  RECTAL:  Rectal is confirmatory.  No mass.  CLINICAL DATA:                Pap smear performed April 04, 1999 was within normal limits.  ASSESSMENT AND PLAN:          The patient is a 45 year old white female, gravida 2, para 2, with endometrial polyp, admitted for dilatation and curettage, hysteroscopy and removal of the polyp.  Risks have been explained to the patient; she expresses understanding of and acceptance of these risks. DD:  02/20/00 TD:  02/20/00 Job: [id number]

## 2010-11-09 NOTE — Op Note (Signed)
[facility]  Patient:    Mallory [name], Mallory [name]                       MRN: [id number] Proc. Date: 02/20/00 Adm. Date:  [id number] Attending:  Madelyn Flavors                           Operative Report  PREOPERATIVE DIAGNOSIS:       Endometrial polyp found on ultrasound and                               confirmed by sonohistogram.  POSTOPERATIVE DIAGNOSIS:      Endometrial polyp found on ultrasound and                               confirmed by sonohistogram.  OPERATION:                    Dilatation and curettage, hysteroscopy, removal                               of endometrial polyp.  SURGEON:                      Beather Arbour. Thomasena Edis, M.D.  ASSISTANT:  ANESTHESIA:                   Monitored anesthesia care plus 10 cc 1%                               lidocaine paracervical block.  ESTIMATED BLOOD LOSS:         Minimal.  COMPLICATIONS:                None.  DRAIN:                        None.  FLUIDS:                       Approximately 700 of Crystalloid.  DESCRIPTION OF PROCEDURE:     The patient was brought to the operating room and identified on the operating room table.  After the patient was adequately sedated using monitored anesthesia care, she was placed in the dorsal lithotomy position and prepped and draped in the usual sterile fashion.  The patients urinalysis had revealed 6 to 10 white blood cells per high powered field and thus a urine culture and sensitivity were sent when the bladder was straight catheterized for approximately 100 cc of clear yellow urine. Examination under anesthesia revealed the uterus to be six weeks, anteverted and level.  No adnexal masses palpated.  The speculum was placed and the anterior lip of the cervix was infiltrated with 1 cc of 1% lidocaine and grasped with a single-tooth tenaculum.  The remaining 9 cc of 1% plain lidocaine were infused for paracervical block. The cervix was very carefully and  gently dilated up to a #25 Pratt dilator.  Dilatation preceded gently and easily to decrease the risk of uterine perforation.  Using the ACMI scope, a hysteroscope was placed into the endometrial cavity and Sorbitol was used as a distending  medium.  The endometrial cavity was very carefully and systematically examined.  The polyp was identified.  Both tubal ostia were identified.  The scope was removed and the uterus was systematically curetted in the systematic clockwise fashion.  The Randall stone forceps were placed and the polyp was removed.  At that point the procedure was then terminated. It should be noted that the curettage preceded such that there was a good cry heard all around.  The patient tolerated the procedure well without apparent complications and was transferred to the recovery room in stable condition after all sponge, needle and instrument counts were correct.  The patient was given the postoperative instruction sheet.  She is to refrain from intercourse for two to three weeks.  She will return to the office for postoperative  evaluation.  She is to take ibuprofen 400 to 600 mg every six hours as needed for pain.  Call with any problems. DD:  02/20/00 TD:  02/20/00 Job: [id number]

## 2010-11-14 ENCOUNTER — Other Ambulatory Visit: Payer: Self-pay

## 2010-11-14 MED ORDER — ALPRAZOLAM 0.5 MG PO TABS: 0.5000 mg | ORAL_TABLET | Freq: Every day | ORAL | Status: DC | PRN

## 2010-11-14 MED ORDER — SIMVASTATIN 40 MG PO TABS: 40.0000 mg | ORAL_TABLET | Freq: Every day | ORAL | Status: DC

## 2010-11-14 MED ORDER — FLUTICASONE PROPIONATE 50 MCG/ACT NA SUSP: 2.0000 | Freq: Every day | NASAL | Status: DC

## 2010-11-23 ENCOUNTER — Other Ambulatory Visit (INDEPENDENT_AMBULATORY_CARE_PROVIDER_SITE_OTHER): Payer: Private Health Insurance - Indemnity

## 2010-11-23 DIAGNOSIS — E785 Hyperlipidemia, unspecified: Secondary | ICD-10-CM

## 2010-11-23 DIAGNOSIS — E669 Obesity, unspecified: Secondary | ICD-10-CM

## 2010-11-23 LAB — LIPID PANEL
Cholesterol: 197 mg/dL (ref 0–200)
Total CHOL/HDL Ratio: 5

## 2010-11-29 ENCOUNTER — Telehealth: Payer: Self-pay | Admitting: Family Medicine

## 2010-11-29 ENCOUNTER — Encounter: Payer: Self-pay | Admitting: Family Medicine

## 2010-11-29 ENCOUNTER — Ambulatory Visit (INDEPENDENT_AMBULATORY_CARE_PROVIDER_SITE_OTHER): Payer: Private Health Insurance - Indemnity | Admitting: Family Medicine

## 2010-11-29 DIAGNOSIS — M25561 Pain in right knee: Secondary | ICD-10-CM

## 2010-11-29 DIAGNOSIS — M797 Fibromyalgia: Secondary | ICD-10-CM

## 2010-11-29 DIAGNOSIS — M25569 Pain in unspecified knee: Secondary | ICD-10-CM

## 2010-11-29 DIAGNOSIS — K219 Gastro-esophageal reflux disease without esophagitis: Secondary | ICD-10-CM

## 2010-11-29 DIAGNOSIS — IMO0001 Reserved for inherently not codable concepts without codable children: Secondary | ICD-10-CM

## 2010-11-29 DIAGNOSIS — F329 Major depressive disorder, single episode, unspecified: Secondary | ICD-10-CM

## 2010-11-29 DIAGNOSIS — J984 Other disorders of lung: Secondary | ICD-10-CM

## 2010-11-29 DIAGNOSIS — F419 Anxiety disorder, unspecified: Secondary | ICD-10-CM

## 2010-11-29 DIAGNOSIS — F411 Generalized anxiety disorder: Secondary | ICD-10-CM

## 2010-11-29 DIAGNOSIS — R911 Solitary pulmonary nodule: Secondary | ICD-10-CM

## 2010-11-29 DIAGNOSIS — E785 Hyperlipidemia, unspecified: Secondary | ICD-10-CM

## 2010-11-29 DIAGNOSIS — J309 Allergic rhinitis, unspecified: Secondary | ICD-10-CM

## 2010-11-29 MED ORDER — ALPRAZOLAM 0.5 MG PO TABS: 0.5000 mg | ORAL_TABLET | Freq: Two times a day (BID) | ORAL | Status: DC | PRN

## 2010-11-29 MED ORDER — MILNACIPRAN HCL 50 MG PO TABS: 50.0000 mg | ORAL_TABLET | Freq: Every day | ORAL | Status: DC

## 2010-11-29 MED ORDER — COLESEVELAM HCL 3.75 G PO PACK: PACK | ORAL | Status: DC

## 2010-11-29 MED ORDER — SERTRALINE HCL 50 MG PO TABS: ORAL_TABLET | ORAL | Status: DC

## 2010-11-29 NOTE — Telephone Encounter (Signed)
Please mail a copy of patient's last lab results to the patient's home address

## 2010-11-29 NOTE — Patient Instructions (Signed)
Hypercholesterolemia High Blood Cholesterol Cholesterol is a white, waxy, fat-like protein needed by your body in small amounts. The liver makes all the cholesterol you need. It is carried from the liver by the blood through the blood vessels. Deposits (plaque) may build up on blood vessel walls. This makes the arteries narrower and stiffer. Plaque increases the risk for heart attack and stroke. You cannot feel your cholesterol level even if it is very high. The only way to know is by a blood test to check your lipid (fats) levels. Once you know your cholesterol levels, you should keep a record of the test results. Work with your caregiver to to keep your levels in the desired range. WHAT THE RESULTS MEAN:  Total cholesterol is a rough measure of all the cholesterol in your blood.   LDL is the so-called bad cholesterol. This is the type that deposits cholesterol in the walls of the arteries. You want this level to be low.   HDL is the good cholesterol because it cleans the arteries and carries the LDL away. You want this level to be high.   Triglycerides are fat that the body can either burn for energy or store. High levels are closely linked to heart disease.  DESIRED LEVELS:  Total cholesterol below 200.   LDL below 100 for people at risk, below 70 for very high risk.   HDL above 50 is good, above 60 is best.   Triglycerides below 150.  HOW TO LOWER YOUR CHOLESTEROL:  Diet.   Choose fish or white meat chicken and [place], roasted or baked. Limit fatty cuts of red meat, fried foods, and processed meats, such as sausage and lunch meat.   Eat lots of fresh fruits and vegetables. Choose whole grains, beans, pasta, potatoes and cereals.   Use only small amounts of olive, corn or canola oils. Avoid butter, mayonnaise, shortening or palm kernel oils. Avoid foods with trans-fats.   Use skim/nonfat milk and low-fat/nonfat yogurt and cheeses. Avoid whole milk, cream, ice cream, egg yolks and  cheeses. Healthy desserts include angel food cake, gingersnaps, animal crackers, hard candy, popsicles, and low-fat/nonfat frozen yogurt. Avoid pastries, cakes, pies and cookies.   Exercise.   A regular program helps decrease LDL and raises HDL.   Helps with weight control.   Do things that increase your activity level like gardening, walking, or taking the stairs.   Medication.   May be prescribed by your caregiver to help lowering cholesterol and the risk for heart disease.   You may need medicine even if your levels are normal if you have several risk factors.  HOME CARE INSTRUCTIONS  Follow your diet and exercise programs as suggested by your caregiver.   Take medications as directed.   Have blood work done when your caregiver feels it is necessary.  MAKE SURE YOU:   Understand these instructions.   Will watch your condition.   Will get help right away if you are not doing well or get worse.  Document Released: 06/10/2005 Document Re-Released: 05/23/2008 [facility] Patient Information 2011 [place], [place]. Try Citracel fiber powder  Try Flaxseed oil or Megared caps  Avoid trans fats/partially hydrogenated

## 2010-11-29 NOTE — Telephone Encounter (Signed)
Mailed lab results. 

## 2010-11-30 DIAGNOSIS — M797 Fibromyalgia: Secondary | ICD-10-CM | POA: Insufficient documentation

## 2010-11-30 NOTE — Assessment & Plan Note (Signed)
Patient reports she has been struggling with this for years and notes she has been having a flare recently with increased fatigue, myalgias and brain fog. She would like to try a change in therapy. She is given a starter pack of Savella and we will titrate this up while we titrate down the Sertraline to 25mg  for a week and then stop this. Reassess in one month or prn.

## 2010-12-01 ENCOUNTER — Encounter: Payer: Self-pay | Admitting: Family Medicine

## 2010-12-01 NOTE — Progress Notes (Signed)
Mallory [name] [id number] April 06, 1966 12/01/2010      Progress Note-Follow Up  Subjective  Chief Complaint  Chief Complaint  Patient presents with  . Follow-up    1 month follow up    HPI  Patient is a 45 year old Caucasian female in today for follow up of multiple medical problems. She has been struggling with some pain in her right heel for the last month. She denies any trauma, erythema, warmth, swelling. She did try a dose of 600 mg Motrin with minimal relief. [name] not tried any other interventions. She notes the pain is worse with excessive exercise especially going up and down stairs. She does also note that her generalized fibromyalgia with neck stiffness and muscle pain has worsened over the last month as well. She continues to struggle with anxiety depression but this feels directly related to her chronic pain and poor sleep as a result. He is willing to try new medication. Mallory [name] in the past and cannot remember her response but does not want to take it again. No recent febrile illness, fevers, chills, chest pain, palpitations, shortness of breath, GI or GU complaints. She is tolerating her Zocor and taking it daily. She does have some allergic symptoms but finds them tolerable with Flonase and Cipro. Her bowels are improved some with addition of probiotics  Past Medical History  Diagnosis Date  . Anxiety   . Depression     no counseling  . GERD (gastroesophageal reflux disease)   . Allergy     year round  . Hyperlipidemia 2002  . IBS (irritable bowel syndrome) 2008    improved over last couple of years using Align  . Interstitial cystitis 2006    Dr Marcine Matar  . Pulmonary nodule 3/24/20101    Incidental finding of CT Chest ordered for chest pain  . Personal history of kidney stones 1986  . Asthma     mild, intermittent, allergy triggered  . Obesity   . Fibromyalgia 1997    dx by rheumatology, no recent flares  . Hiatal hernia   . Nasal lesion 10/24/2010  .  PULMONARY NODULE 02/10/2009  . OBESITY 02/10/2009  . NECK PAIN 12/15/2009  . HYPERLIPIDEMIA 02/10/2009  . GERD 02/10/2009  . DEPRESSION 02/10/2009  . Chronic laryngitis 04/26/2009  . ANXIETY 02/10/2009  . ALLERGIC RHINITIS 02/10/2009  . Rash 10/24/2010  . Knee pain, right 12/01/2010    Past Surgical History  Procedure Date  . Kidney stone surgery 1986  . Dilation and curettage of uterus 2002  . Tonsillectomy   . Polypectomy     uterine    Family History  Problem Relation Age of Onset  . Cancer Mother     breast/ ovarian  . Diabetes Mother   . Hyperlipidemia Mother   . Hypertension Mother   . Cancer Maternal Aunt     colon  . Arthritis Paternal Grandmother   . Heart disease Paternal Grandmother   . Stroke Father 105  . Migraines Sister   . Cancer Maternal Grandmother   . Cancer Maternal Grandfather     History   Social History  . Marital Status: Married    Spouse Name: N/A    Number of Children: 2  . Years of Education: N/A   Occupational History  . unemployed    Social History Main Topics  . Smoking status: Never Smoker   . Smokeless tobacco: Never Used  . Alcohol Use: No  . Drug Use: No  . Sexually Active: Yes --  Female partner(s)   Other Topics Concern  . Not on file   Social History Narrative  . No narrative on file    Current Outpatient Prescriptions on File Prior to Visit  Medication Sig Dispense Refill  . albuterol (PROAIR HFA) 108 (90 BASE) MCG/ACT inhaler Inhale 1 -2 puffs by mouth every 4-6 hours as needed.       . bacitracin ointment Apply to affected area daily  30 g  0  . bifidobacterium infantis (ALIGN) capsule Take 1 capsule by mouth daily.        . clidinium-chlordiazepoxide (LIBRAX) 2.5-5 MG per capsule Take 1 capsule by mouth 2 (two) times daily. As needed.       . cyclobenzaprine (FLEXERIL) 10 MG tablet Take 1 tablet (10 mg total) by mouth every 8 (eight) hours as needed for muscle spasms.  30 tablet  1  . fluticasone (FLONASE) 50 MCG/ACT nasal  spray 2 sprays by Nasal route daily. Each nostril.  180 Act  1  . levocetirizine (XYZAL) 5 MG tablet Take 5 mg by mouth daily.        . Multiple Vitamin (MULTIVITAMIN) tablet Take 1 tablet by mouth daily.        . norethindrone-ethinyl estradiol (JUNEL FE 1/20) 1-20 MG-MCG per tablet Take 1 tablet by mouth daily.        Marland Kitchen omeprazole-sodium bicarbonate (ZEGERID) 40-1100 MG per capsule Take 1 capsule by mouth daily.        . simvastatin (ZOCOR) 40 MG tablet Take 1 tablet (40 mg total) by mouth daily.  90 tablet  1    Allergies  Allergen Reactions  . Venlafaxine     REACTION: bad dreams    Review of Systems  Review of Systems  Constitutional: Positive for malaise/fatigue. Negative for fever.  HENT: Negative for congestion.   Eyes: Negative for discharge.  Respiratory: Negative for shortness of breath.   Cardiovascular: Negative for chest pain, palpitations and leg swelling.  Gastrointestinal: Negative for nausea, abdominal pain and diarrhea.  Genitourinary: Negative for dysuria.  Musculoskeletal: Positive for myalgias and joint pain. Negative for falls.       Right knee pain, Motrin not terribly helpful  Skin: Negative for rash.  Neurological: Negative for dizziness, loss of consciousness and headaches.  Endo/Heme/Allergies: Negative for polydipsia.  Psychiatric/Behavioral: Negative for depression, suicidal ideas and substance abuse. The patient is nervous/anxious. The patient does not have insomnia.     Objective  BP 130/79  Pulse 78  Temp(Src) 97.8 F (36.6 C) (Oral)  Ht 5' 11.5" (1.816 m)  Wt 255 lb 8 oz (115.894 kg)  BMI 35.14 kg/m2  SpO2 96%  LMP 11/23/2010  Physical Exam  Physical Exam  Constitutional: She is oriented to person, place, and time and well-developed, well-nourished, and in no distress. No distress.  HENT:  Head: Normocephalic and atraumatic.  Eyes: Conjunctivae are normal.  Neck: Neck supple. No thyromegaly present.  Cardiovascular: Normal rate,  regular rhythm and normal heart sounds.   No murmur heard. Pulmonary/Chest: Effort normal and breath sounds normal. She has no wheezes.  Abdominal: She exhibits no distension and no mass.  Musculoskeletal: She exhibits no edema.  Lymphadenopathy:    She has no cervical adenopathy.  Neurological: She is alert and oriented to person, place, and time.  Skin: Skin is warm and dry. No rash noted. She is not diaphoretic.  Psychiatric: Memory, affect and judgment normal.    Lab Results  Component Value Date   TSH 2.32  11/23/2010   Lab Results  Component Value Date   WBC 9.7 03/31/2009   HGB 12.2 03/31/2009   HCT 39.0 03/31/2009   MCV 87.1 03/31/2009   PLT 315 03/31/2009   Lab Results  Component Value Date   CREATININE 0.99 03/31/2009   BUN 12 03/31/2009   NA 140 03/31/2009   K 4.4 03/31/2009   CL 104 03/31/2009   CO2 21 03/31/2009   Lab Results  Component Value Date   ALT 14 03/31/2009   AST 17 03/31/2009   ALKPHOS 92 03/31/2009   BILITOT 0.3 03/31/2009   Lab Results  Component Value Date   CHOL 197 11/23/2010   Lab Results  Component Value Date   HDL 38.90* 11/23/2010   Lab Results  Component Value Date   LDLCALC 117* 03/31/2009   Lab Results  Component Value Date   TRIG 203.0* 11/23/2010   Lab Results  Component Value Date   CHOLHDL 5 11/23/2010     Assessment & Plan  Fibromyalgia Patient reports she has been struggling with this for years and notes she has been having a flare recently with increased fatigue, myalgias and brain fog. She would like to try a change in therapy. She is given a starter pack of Savella and we will titrate this up while we titrate down the Sertraline to 25mg  for a week and then stop this. Reassess in one month or prn.  PULMONARY NODULE Final CT scan for surveillance ordered today, if nodule is stable will decrease scanning to as needed as recommended. Patientasymptomatic  Knee pain, right Patient denies any trauma but has been having trouble with her  right knee for the past month roughly, going up and down stairs is the worst. No swelling or erythema or warmth is obvious. Suspect a low grade bursitis, suggest ice, Aspercreme and occasional NSAID if no improvement will reevaluate  GERD No c/o on current meds, no change to therapy, avoid offending foods  HYPERLIPIDEMIA Patient reports numbers today are better than previous, will continue Simvastatin and we will add Welchol powder 3.75gm in fluids daily, recheck FLP in 3 months.  DEPRESSION Patient does not feel the Sertraline is helping and she is more concerned with her pain and fibromyalgia will switch from Sertraline as directed and start Savella, reeval in 1 month. Patient notes she has tried Pristiq in the past and did not tolerate it.  ALLERGIC RHINITIS Patient notes acceptable relief with Xyzal and Flonase, will continue samee  ANXIETY Will allow a few extra Xanax for the month while we transition from Sertraline to [facility] and reevaluate next month

## 2010-12-01 NOTE — Assessment & Plan Note (Signed)
Patient reports numbers today are better than previous, will continue Simvastatin and we will add Welchol powder 3.75gm in fluids daily, recheck FLP in 3 months.

## 2010-12-01 NOTE — Assessment & Plan Note (Signed)
Patient denies any trauma but has been having trouble with her right knee for the past month roughly, going up and down stairs is the worst. No swelling or erythema or warmth is obvious. Suspect a low grade bursitis, suggest ice, Aspercreme and occasional NSAID if no improvement will reevaluate

## 2010-12-01 NOTE — Assessment & Plan Note (Signed)
Patient notes acceptable relief with Xyzal and Flonase, will continue samee

## 2010-12-01 NOTE — Assessment & Plan Note (Signed)
No c/o on current meds, no change to therapy, avoid offending foods

## 2010-12-01 NOTE — Assessment & Plan Note (Signed)
Will allow a few extra Xanax for the month while we transition from Sertraline to [facility] and reevaluate next month

## 2010-12-01 NOTE — Assessment & Plan Note (Signed)
Final CT scan for surveillance ordered today, if nodule is stable will decrease scanning to as needed as recommended. Patientasymptomatic

## 2010-12-01 NOTE — Assessment & Plan Note (Signed)
Patient does not feel the Sertraline is helping and she is more concerned with her pain and fibromyalgia will switch from Sertraline as directed and start Savella, reeval in 1 month. Patient notes she has tried Pristiq in the past and did not tolerate it.

## 2010-12-03 ENCOUNTER — Telehealth: Payer: Self-pay

## 2010-12-03 NOTE — Telephone Encounter (Signed)
Pt called for an FYI stating the meds she just started made her feel really bad so she just wanted the MD to know she is just taking the Zoloft at this present time (for the anxiety/ depression)

## 2010-12-03 NOTE — Telephone Encounter (Signed)
She can stay in the Zoloft or she can come in and we can try a different one or we can try starting Pristiq or Cymbalta and we will have her in in a couple weeks to reassess

## 2010-12-04 NOTE — Telephone Encounter (Signed)
RC from pt.  She went back to 50mg  Zoloft.  Pt states it's not really a good time for her to change meds so she declined new RX today.  Pt will keep next follow up appt and discuss meds at that time.

## 2010-12-04 NOTE — Telephone Encounter (Signed)
I have attempted to contact this patient by phone with the following results: left message to return my call on answering machine (mobile).  

## 2010-12-06 ENCOUNTER — Other Ambulatory Visit: Payer: Private Health Insurance - Indemnity

## 2010-12-12 ENCOUNTER — Telehealth: Payer: Self-pay

## 2010-12-12 NOTE — Telephone Encounter (Signed)
Take a 1/2 a tablet of the 50mg  Sertraline daily for the next week and then stop.

## 2010-12-12 NOTE — Telephone Encounter (Signed)
Left a detailed message on pts phone. Asked pt to call me back and let us know if she wants to pickup samples or for [place] to call in an RX?

## 2010-12-12 NOTE — Telephone Encounter (Signed)
Pt states her sister uses Cymbalta and she would like to try Cymbalta. Pt states she was told to call back if she decided to go on something.

## 2010-12-12 NOTE — Telephone Encounter (Signed)
She can try Cymbalta, we have samples so if she is willing to come get them I would have her take Cymbalta 30mg  tab 1 tab daily x 7 days then increase to the 60mg  tab 1 tab po daily. Would given her 2 bottles of the 30mg s and 4 bottles of the 60mg s to see how she tolerates it. Then she should see me in 3-4 weeks to see how she is doing. Call for sooner appt if she has any difficulty. SE could include GI upset/nausea/diarrhea these all tend to get better over first week if they are tolerable. Some people get sleepy others can't sleep. Usually neither one. If sleepy take at night, if awake take in am. Palpitations/worse anxiety can occur on rare occasions. Usually it helps pain, mood and menopausal symptoms all well.

## 2010-12-12 NOTE — Telephone Encounter (Signed)
Please advise titrating off Zoloft

## 2010-12-13 NOTE — Telephone Encounter (Signed)
Pt was informed yesterday when she picked up medication

## 2010-12-24 ENCOUNTER — Encounter: Payer: Self-pay | Admitting: Family Medicine

## 2010-12-24 ENCOUNTER — Ambulatory Visit (INDEPENDENT_AMBULATORY_CARE_PROVIDER_SITE_OTHER): Payer: Private Health Insurance - Indemnity | Admitting: Family Medicine

## 2010-12-24 VITALS — BP 136/79 | HR 79 | Temp 97.6°F | Ht 71.5 in | Wt 255.0 lb

## 2010-12-24 DIAGNOSIS — H109 Unspecified conjunctivitis: Secondary | ICD-10-CM

## 2010-12-24 MED ORDER — AZELASTINE HCL 0.05 % OP SOLN: 2.0000 [drp] | Freq: Two times a day (BID) | OPHTHALMIC | Status: DC

## 2010-12-24 NOTE — Progress Notes (Signed)
OFFICE NOTE  12/24/2010  CC: No chief complaint on file.    HPI:   Patient is a 45 y.o. Caucasian female who is here for watery eyes. Onset 4d/a while on a boat tour of a river in [place], [place].  Clear, watery eyes with mild itching.  No pain, no fevers, no URI or allergic rhinitis sx's. Blood shot appearance to both eyes, no swelling of soft tissues around eyes. No rash.  No known sick contacts. Pertinent PMH:  Allergic rhinitis but not usually with conjunctivitis Anxiety/Dep IBS Works as Advertising account planner. 2 MEDS;   Outpatient Prescriptions Prior to Visit  Medication Sig Dispense Refill  . albuterol (PROAIR HFA) 108 (90 BASE) MCG/ACT inhaler Inhale 1 -2 puffs by mouth every 4-6 hours as needed.       . ALPRAZolam (XANAX) 0.5 MG tablet Take 1 tablet (0.5 mg total) by mouth 2 (two) times daily as needed.  60 tablet  1  . bifidobacterium infantis (ALIGN) capsule Take 1 capsule by mouth daily.        . clidinium-chlordiazepoxide (LIBRAX) 2.5-5 MG per capsule Take 1 capsule by mouth 2 (two) times daily. As needed.       . cyclobenzaprine (FLEXERIL) 10 MG tablet Take 1 tablet (10 mg total) by mouth every 8 (eight) hours as needed for muscle spasms.  30 tablet  1  . fluticasone (FLONASE) 50 MCG/ACT nasal spray 2 sprays by Nasal route daily. Each nostril.  180 Act  1  . levocetirizine (XYZAL) 5 MG tablet Take 5 mg by mouth daily.        . norethindrone-ethinyl estradiol (JUNEL FE 1/20) 1-20 MG-MCG per tablet Take 1 tablet by mouth daily.        Marland Kitchen omeprazole-sodium bicarbonate (ZEGERID) 40-1100 MG per capsule Take 1 capsule by mouth daily.        . sertraline (ZOLOFT) 50 MG tablet 1/2 tab po for 7 days and then stop      . simvastatin (ZOCOR) 40 MG tablet Take 1 tablet (40 mg total) by mouth daily.  90 tablet  1  . Colesevelam HCl (WELCHOL) 3.75 G PACK 1 dose daily, dissolve 1 packet in glass of fluid daily  30 each  2  . Multiple Vitamin (MULTIVITAMIN) tablet Take 1 tablet by mouth daily.         . SUMAtriptan (IMITREX) 25 MG tablet       . bacitracin ointment Apply to affected area daily  30 g  0  . Milnacipran HCl (SAVELLA) 50 MG TABS Take 1 tablet (50 mg total) by mouth daily.  30 tablet  2    PE: Blood pressure 136/79, pulse 79, temperature 97.6 F (36.4 C), temperature source Temporal, height 5' 11.5" (1.816 m), weight 255 lb (115.667 kg), last menstrual period 11/23/2010, SpO2 98.00%. Gen: Alert, well appearing.  Patient is oriented to person, place, time, and situation. HEENT: Scalp without lesions or hair loss.  Ears: EACs clear, normal epithelium.  TMs with good light reflex and landmarks bilaterally.  Eyes: Mild bilateral bulbar conjunctival injection, mainly lateral portion of sclera.   No icteris, swelling, or exudate.  Palpebral conjunctivae with normal color, normal lashes.  No swelling.  EOMI, PERRLA. Nose: no drainage or turbinate edema/swelling.  No injection or focal lesion.  Mouth: lips without lesion/swelling.  Oral mucosa pink and moist.  Dentition intact and without obvious caries or gingival swelling.  Oropharynx without erythema, exudate, or swelling.  Neck: supple, ROM full.  Carotids 2+  bilat, without bruit.  No lymphadenopathy, thyromegaly, or mass.   IMPRESSION AND PLAN:  Conjunctivitis Irritant (wind/sun) vs allergic.  Infectious less likely. Trial of optivar 2 gtts each eye bid.  If not improved in 2d, will refer to ophthalmologist for further e/m.     FOLLOW UP:  Return if symptoms worsen or fail to improve.

## 2010-12-24 NOTE — Assessment & Plan Note (Signed)
Irritant (wind/sun) vs allergic.  Infectious less likely. Trial of optivar 2 gtts each eye bid.  If not improved in 2d, will refer to ophthalmologist for further e/m.

## 2010-12-31 ENCOUNTER — Telehealth: Payer: Self-pay

## 2010-12-31 DIAGNOSIS — H109 Unspecified conjunctivitis: Secondary | ICD-10-CM

## 2010-12-31 NOTE — Telephone Encounter (Signed)
Pt called stating she came in last Monday and saw Dr Milinda Cave. Pt states her eyes are "tearing" up and MD gave her eye drops. Pt was told that if they didn't get better she might need to be referred to an eye specialist? Pt would like to know if she can have a referral?

## 2010-12-31 NOTE — Telephone Encounter (Signed)
Pt states she would just like a referral

## 2010-12-31 NOTE — Telephone Encounter (Signed)
Addended by: Danise Edge A on: 12/31/2010 04:13 PM   Modules accepted: Orders

## 2010-12-31 NOTE — Telephone Encounter (Signed)
So she can have a referral if she wants it but I need to know what symptoms still persist? Discharge, pain, itching, visual changes. If it is just some swelling and discharge then apply warm compresses tid and gentle massage with clean wash cloth and I would wait another week if it were me, unless there are more symptoms. I can look at it if she would like. Conjunctivitis can be bacterial, viral or allergic, if it is only 1 eye it is not likely to be allergic. The problem with our drops is they only treat bacteria, if this has been viral all along ( cannot know readily because symptoms are the same) the drops are only gonna help the bacteria so the symptoms from virus can last a full 14 days at times. Just let me know what patient wants to do

## 2010-12-31 NOTE — Telephone Encounter (Signed)
Patient states it is just her right eye now and its watery and has intense itching- no discharge.

## 2010-12-31 NOTE — Telephone Encounter (Signed)
Patient calls to report persistent irritation, discharge from eyes requesting referral for further evaluation, will arrange referral

## 2010-12-31 NOTE — Telephone Encounter (Signed)
Did you get this already?

## 2011-01-01 ENCOUNTER — Telehealth: Payer: Self-pay

## 2011-01-01 NOTE — Telephone Encounter (Signed)
FYI: Pt no longer needs a referral! Pt called her insurance company and they told her she doesn't need a referral so she scheduled an appt for today.

## 2011-01-01 NOTE — Telephone Encounter (Signed)
Noted on patient's referral.

## 2011-01-16 ENCOUNTER — Ambulatory Visit (INDEPENDENT_AMBULATORY_CARE_PROVIDER_SITE_OTHER): Payer: Private Health Insurance - Indemnity | Admitting: Family Medicine

## 2011-01-16 ENCOUNTER — Ambulatory Visit (INDEPENDENT_AMBULATORY_CARE_PROVIDER_SITE_OTHER)
Admission: RE | Admit: 2011-01-16 | Discharge: 2011-01-16 | Disposition: A | Discharge status: Home / Self Care | Payer: Private Health Insurance - Indemnity | Source: Ambulatory Visit | Attending: Family Medicine | Admitting: Family Medicine

## 2011-01-16 ENCOUNTER — Ambulatory Visit (HOSPITAL_BASED_OUTPATIENT_CLINIC_OR_DEPARTMENT_OTHER)
Admission: RE | Admit: 2011-01-16 | Discharge: 2011-01-16 | Disposition: A | Discharge status: Home / Self Care | Payer: Private Health Insurance - Indemnity | Source: Ambulatory Visit | Attending: Family Medicine | Admitting: Family Medicine

## 2011-01-16 ENCOUNTER — Other Ambulatory Visit: Payer: Self-pay | Admitting: Family Medicine

## 2011-01-16 ENCOUNTER — Encounter: Payer: Self-pay | Admitting: Family Medicine

## 2011-01-16 VITALS — BP 109/71 | HR 87 | Temp 97.5°F | Ht 71.5 in | Wt 253.1 lb

## 2011-01-16 DIAGNOSIS — F419 Anxiety disorder, unspecified: Secondary | ICD-10-CM

## 2011-01-16 DIAGNOSIS — F3289 Other specified depressive episodes: Secondary | ICD-10-CM

## 2011-01-16 DIAGNOSIS — H109 Unspecified conjunctivitis: Secondary | ICD-10-CM

## 2011-01-16 DIAGNOSIS — M25561 Pain in right knee: Secondary | ICD-10-CM

## 2011-01-16 DIAGNOSIS — M25569 Pain in unspecified knee: Secondary | ICD-10-CM | POA: Insufficient documentation

## 2011-01-16 DIAGNOSIS — M25562 Pain in left knee: Secondary | ICD-10-CM

## 2011-01-16 DIAGNOSIS — K219 Gastro-esophageal reflux disease without esophagitis: Secondary | ICD-10-CM

## 2011-01-16 DIAGNOSIS — F411 Generalized anxiety disorder: Secondary | ICD-10-CM

## 2011-01-16 DIAGNOSIS — E785 Hyperlipidemia, unspecified: Secondary | ICD-10-CM

## 2011-01-16 DIAGNOSIS — E7800 Pure hypercholesterolemia, unspecified: Secondary | ICD-10-CM

## 2011-01-16 DIAGNOSIS — F329 Major depressive disorder, single episode, unspecified: Secondary | ICD-10-CM

## 2011-01-16 MED ORDER — SERTRALINE HCL 50 MG PO TABS
50.0000 mg | ORAL_TABLET | Freq: Every day | ORAL | Status: DC
Start: 2011-01-16 — End: 2011-02-07

## 2011-01-16 MED ORDER — COLESEVELAM HCL 625 MG PO TABS: 1875.0000 mg | ORAL_TABLET | Freq: Two times a day (BID) | ORAL | Status: DC

## 2011-01-16 NOTE — Patient Instructions (Signed)
Hypercholesterolemia High Blood Cholesterol Cholesterol is a white, waxy, fat-like protein needed by your body in small amounts. The liver makes all the cholesterol you need. It is carried from the liver by the blood through the blood vessels. Deposits (plaque) may build up on blood vessel walls. This makes the arteries narrower and stiffer. Plaque increases the risk for heart attack and stroke. You cannot feel your cholesterol level even if it is very high. The only way to know is by a blood test to check your lipid (fats) levels. Once you know your cholesterol levels, you should keep a record of the test results. Work with your caregiver to to keep your levels in the desired range. WHAT THE RESULTS MEAN:  Total cholesterol is a rough measure of all the cholesterol in your blood.   LDL is the so-called bad cholesterol. This is the type that deposits cholesterol in the walls of the arteries. You want this level to be low.   HDL is the good cholesterol because it cleans the arteries and carries the LDL away. You want this level to be high.   Triglycerides are fat that the body can either burn for energy or store. High levels are closely linked to heart disease.  DESIRED LEVELS:  Total cholesterol below 200.   LDL below 100 for people at risk, below 70 for very high risk.   HDL above 50 is good, above 60 is best.   Triglycerides below 150.  HOW TO LOWER YOUR CHOLESTEROL:  Diet.   Choose fish or white meat chicken and [place], roasted or baked. Limit fatty cuts of red meat, fried foods, and processed meats, such as sausage and lunch meat.   Eat lots of fresh fruits and vegetables. Choose whole grains, beans, pasta, potatoes and cereals.   Use only small amounts of olive, corn or canola oils. Avoid butter, mayonnaise, shortening or palm kernel oils. Avoid foods with trans-fats.   Use skim/nonfat milk and low-fat/nonfat yogurt and cheeses. Avoid whole milk, cream, ice cream, egg yolks and  cheeses. Healthy desserts include angel food cake, gingersnaps, animal crackers, hard candy, popsicles, and low-fat/nonfat frozen yogurt. Avoid pastries, cakes, pies and cookies.   Exercise.   A regular program helps decrease LDL and raises HDL.   Helps with weight control.   Do things that increase your activity level like gardening, walking, or taking the stairs.   Medication.   May be prescribed by your caregiver to help lowering cholesterol and the risk for heart disease.   You may need medicine even if your levels are normal if you have several risk factors.  HOME CARE INSTRUCTIONS  Follow your diet and exercise programs as suggested by your caregiver.   Take medications as directed.   Have blood work done when your caregiver feels it is necessary.  MAKE SURE YOU:   Understand these instructions.   Will watch your condition.   Will get help right away if you are not doing well or get worse.  Document Released: 06/10/2005 Document Re-Released: 05/23/2008 [facility] Patient Information 2011 [place], [place].  Try MegaRed by Schiff 1 cap daily if no improvement in knees after a month can add Move free for 3 months to see if  It helps Start Welchol by taking  1 tab twice a day for a week then 2 tabs twice a day x a week then increase to 3 tabs twice daily as tolerated

## 2011-01-21 ENCOUNTER — Ambulatory Visit: Payer: Private Health Insurance - Indemnity | Admitting: Family Medicine

## 2011-01-22 NOTE — Assessment & Plan Note (Addendum)
Patient did not tolerate Cymbalta and feels that overall her anxiety, depression and OCD symptoms are better on Sertraline than the SNRIs and at this point she would like to stay on Sertraline for the time being.  Cymbalta caused some fuzzy thinking, Pristiq caused GI upset, Paxil caused weight gain

## 2011-01-22 NOTE — Assessment & Plan Note (Signed)
Right knee pain has been worse in the past but she is presently having increased pain in her left knee, xrays negative on review, continue fatty acids and may need ortho referral if symptoms persist

## 2011-01-22 NOTE — Progress Notes (Signed)
Mallory [name] [id number] 1965/11/01 01/22/2011      Progress Note-Follow Up  Subjective  Chief Complaint  Chief Complaint  Patient presents with  . Follow-up    1 month follow up on medication- welchol and cymbalta    HPI  Patient is in for follow up. She is struggling with some knee pain previously R>L but now L>R no injury, swelling, trauma. Pain in left knee is sharp and worse with weight baring and position change. She did not tolerate Savella or Cymbalta she had difficulty concentrating and she felt her OCD symptoms worsened, she switched back to the Zoloft and believes she would like to stay on it. No recent illness, fevers, chills, CP, palp, SOB, GI or GU c/o. Is following with her OB/GYN for menstrual symtpoms and continues to struggle. Optometry has confirmed allergic conjunctivitis and she is better at this time.  Past Medical History  Diagnosis Date  . Anxiety   . Depression     no counseling  . GERD (gastroesophageal reflux disease)   . Allergy     year round  . Hyperlipidemia 2002  . IBS (irritable bowel syndrome) 2008    improved over last couple of years using Align  . Interstitial cystitis 2006    Dr Marcine Matar  . Pulmonary nodule 3/24/20101    Incidental finding of CT Chest ordered for chest pain  . Personal history of kidney stones 1986  . Asthma     mild, intermittent, allergy triggered  . Obesity   . Fibromyalgia 1997    dx by rheumatology, no recent flares  . Hiatal hernia   . Nasal lesion 10/24/2010  . PULMONARY NODULE 02/10/2009  . OBESITY 02/10/2009  . NECK PAIN 12/15/2009  . HYPERLIPIDEMIA 02/10/2009  . GERD 02/10/2009  . DEPRESSION 02/10/2009  . Chronic laryngitis 04/26/2009  . ANXIETY 02/10/2009  . ALLERGIC RHINITIS 02/10/2009  . Rash 10/24/2010  . Knee pain, right 12/01/2010    Past Surgical History  Procedure Date  . Kidney stone surgery 1986  . Dilation and curettage of uterus 2002  . Tonsillectomy   . Polypectomy     uterine     Family History  Problem Relation Age of Onset  . Cancer Mother     breast/ ovarian  . Diabetes Mother   . Hyperlipidemia Mother   . Hypertension Mother   . Cancer Maternal Aunt     colon  . Arthritis Paternal Grandmother   . Heart disease Paternal Grandmother   . Stroke Father 65  . Migraines Sister   . Cancer Maternal Grandmother   . Cancer Maternal Grandfather     History   Social History  . Marital Status: Married    Spouse Name: N/A    Number of Children: 2  . Years of Education: N/A   Occupational History  . unemployed    Social History Main Topics  . Smoking status: Former Games developer  . Smokeless tobacco: Never Used   Comment: when a teenager  . Alcohol Use: No  . Drug Use: No  . Sexually Active: Yes -- Female partner(s)   Other Topics Concern  . Not on file   Social History Narrative  . No narrative on file    Current Outpatient Prescriptions on File Prior to Visit  Medication Sig Dispense Refill  . albuterol (PROAIR HFA) 108 (90 BASE) MCG/ACT inhaler Inhale 1 -2 puffs by mouth every 4-6 hours as needed.       . ALPRAZolam (XANAX) 0.5  MG tablet Take 1 tablet (0.5 mg total) by mouth 2 (two) times daily as needed.  60 tablet  1  . bifidobacterium infantis (ALIGN) capsule Take 1 capsule by mouth daily.        . clidinium-chlordiazepoxide (LIBRAX) 2.5-5 MG per capsule Take 1 capsule by mouth 2 (two) times daily. As needed.       . cyclobenzaprine (FLEXERIL) 10 MG tablet Take 1 tablet (10 mg total) by mouth every 8 (eight) hours as needed for muscle spasms.  30 tablet  1  . fluticasone (FLONASE) 50 MCG/ACT nasal spray 2 sprays by Nasal route daily. Each nostril.  180 Act  1  . levocetirizine (XYZAL) 5 MG tablet Take 5 mg by mouth daily.        . norethindrone-ethinyl estradiol (JUNEL FE 1/20) 1-20 MG-MCG per tablet Take 1 tablet by mouth daily.        Marland Kitchen omeprazole-sodium bicarbonate (ZEGERID) 40-1100 MG per capsule Take 1 capsule by mouth daily.        .  simvastatin (ZOCOR) 40 MG tablet Take 1 tablet (40 mg total) by mouth daily.  90 tablet  1  . Multiple Vitamin (MULTIVITAMIN) tablet Take 1 tablet by mouth daily.          Allergies  Allergen Reactions  . Venlafaxine     REACTION: bad dreams    Review of Systems  Review of Systems  Constitutional: Negative for fever and malaise/fatigue.  HENT: Negative for ear pain, congestion and ear discharge.   Eyes: Negative for discharge.  Respiratory: Negative for shortness of breath.   Cardiovascular: Negative for chest pain, palpitations and leg swelling.  Gastrointestinal: Negative for nausea, abdominal pain and diarrhea.  Genitourinary: Negative for dysuria.  Musculoskeletal: Positive for joint pain. Negative for falls.       B/l knee pain L>R  Skin: Negative for rash.  Neurological: Negative for loss of consciousness and headaches.  Endo/Heme/Allergies: Negative for polydipsia.  Psychiatric/Behavioral: Negative for depression and suicidal ideas. The patient is not nervous/anxious and does not have insomnia.     Objective  BP 109/71  Pulse 87  Temp(Src) 97.5 F (36.4 C) (Oral)  Ht 5' 11.5" (1.816 m)  Wt 253 lb 1.9 oz (114.814 kg)  BMI 34.81 kg/m2  SpO2 95%  LMP 01/09/2011  Physical Exam  Physical Exam  Constitutional: She is oriented to person, place, and time and well-developed, well-nourished, and in no distress. No distress.  HENT:  Head: Normocephalic and atraumatic.  Eyes: Conjunctivae are normal.  Neck: Neck supple. No thyromegaly present.  Cardiovascular: Normal rate, regular rhythm and normal heart sounds.   No murmur heard. Pulmonary/Chest: Effort normal and breath sounds normal. She has no wheezes.  Abdominal: She exhibits no distension and no mass.  Musculoskeletal: She exhibits no edema.  Lymphadenopathy:    She has no cervical adenopathy.  Neurological: She is alert and oriented to person, place, and time.  Skin: Skin is warm and dry. No rash noted. She  is not diaphoretic.  Psychiatric: Memory, affect and judgment normal.    Lab Results  Component Value Date   TSH 2.32 11/23/2010   Lab Results  Component Value Date   WBC 9.7 03/31/2009   HGB 12.2 03/31/2009   HCT 39.0 03/31/2009   MCV 87.1 03/31/2009   PLT 315 03/31/2009   Lab Results  Component Value Date   CREATININE 0.99 03/31/2009   BUN 12 03/31/2009   NA 140 03/31/2009   K 4.4 03/31/2009  CL 104 03/31/2009   CO2 21 03/31/2009   Lab Results  Component Value Date   ALT 14 03/31/2009   AST 17 03/31/2009   ALKPHOS 92 03/31/2009   BILITOT 0.3 03/31/2009   Lab Results  Component Value Date   CHOL 197 11/23/2010   Lab Results  Component Value Date   HDL 38.90* 11/23/2010   Lab Results  Component Value Date   LDLCALC 117* 03/31/2009   Lab Results  Component Value Date   TRIG 203.0* 11/23/2010   Lab Results  Component Value Date   CHOLHDL 5 11/23/2010     Assessment & Plan  DEPRESSION Patient did not tolerate Cymbalta and feels that overall her anxiety, depression and OCD symptoms are better on Sertraline than the SNRIs and at this point she would like to stay on Sertraline for the time being.  Cymbalta caused some fuzzy thinking, Pristiq caused GI upset, Paxil caused weight gain  HYPERLIPIDEMIA Did not tolerate welchol powder but is willing to try and tolerate the caps, is given samples for 3 caps po bid and she is to avoid trans fats and continue fatty acid supplements.  GERD Symptoms manageable on Zegerid no changes.  Knee pain, right Right knee pain has been worse in the past but she is presently having increased pain in her left knee, xrays negative on review, continue fatty acids and may need ortho referral if symptoms persist  Conjunctivitis Patient has been seen by Optometry and allergic conjunctivitis was confirmed symptoms are improved at present.

## 2011-01-22 NOTE — Assessment & Plan Note (Signed)
Did not tolerate welchol powder but is willing to try and tolerate the caps, is given samples for 3 caps po bid and she is to avoid trans fats and continue fatty acid supplements.

## 2011-01-22 NOTE — Assessment & Plan Note (Signed)
Symptoms manageable on Zegerid no changes.

## 2011-01-22 NOTE — Assessment & Plan Note (Signed)
Patient has been seen by Optometry and allergic conjunctivitis was confirmed symptoms are improved at present.

## 2011-01-23 ENCOUNTER — Telehealth: Payer: Self-pay

## 2011-01-23 ENCOUNTER — Other Ambulatory Visit: Payer: Private Health Insurance - Indemnity

## 2011-01-23 ENCOUNTER — Ambulatory Visit
Admission: RE | Admit: 2011-01-23 | Discharge: 2011-01-23 | Disposition: A | Discharge status: Home / Self Care | Payer: Private Health Insurance - Indemnity | Source: Ambulatory Visit | Attending: Family Medicine | Admitting: Family Medicine

## 2011-01-23 DIAGNOSIS — J984 Other disorders of lung: Secondary | ICD-10-CM

## 2011-01-23 DIAGNOSIS — R911 Solitary pulmonary nodule: Secondary | ICD-10-CM

## 2011-01-23 NOTE — Telephone Encounter (Signed)
Pt informed

## 2011-01-23 NOTE — Telephone Encounter (Signed)
Pt states she has stopped her Welchol tablets, they also gave her terrible stomach pains and gas (as the powder did too). Pt is taking Mega reds and would like to know if there is anything else the md would like her to take since stopping Welchol? And should she move up her lab appt since no longer taking Welchol or keep it the same? Pt also states she is having "needle sharp pains" in her legs? Pt is wandering if the Simvastatin is causing this pain along with the weakness in her knees? Pt callback number is [phone]. Please advise?

## 2011-01-23 NOTE — Telephone Encounter (Signed)
Have her stop the simvastatin for 2 weeks and see if the weakness gets better, if it does I would restart her on a different kind of statin, ie Crestor which has less SE, start at 20mg  po qhs and give her samples for 30days to make sure she tolerates. Would check LFT in 1 month after starting Crestor. Then check lipids in 3 months with hepatic

## 2011-02-07 ENCOUNTER — Ambulatory Visit (INDEPENDENT_AMBULATORY_CARE_PROVIDER_SITE_OTHER): Payer: Private Health Insurance - Indemnity | Admitting: Family Medicine

## 2011-02-07 ENCOUNTER — Encounter: Payer: Self-pay | Admitting: Family Medicine

## 2011-02-07 DIAGNOSIS — K589 Irritable bowel syndrome without diarrhea: Secondary | ICD-10-CM

## 2011-02-07 DIAGNOSIS — M25569 Pain in unspecified knee: Secondary | ICD-10-CM

## 2011-02-07 DIAGNOSIS — F411 Generalized anxiety disorder: Secondary | ICD-10-CM

## 2011-02-07 DIAGNOSIS — F329 Major depressive disorder, single episode, unspecified: Secondary | ICD-10-CM

## 2011-02-07 DIAGNOSIS — E785 Hyperlipidemia, unspecified: Secondary | ICD-10-CM

## 2011-02-07 DIAGNOSIS — F419 Anxiety disorder, unspecified: Secondary | ICD-10-CM

## 2011-02-07 DIAGNOSIS — F429 Obsessive-compulsive disorder, unspecified: Secondary | ICD-10-CM

## 2011-02-07 DIAGNOSIS — R600 Localized edema: Secondary | ICD-10-CM

## 2011-02-07 DIAGNOSIS — M25561 Pain in right knee: Secondary | ICD-10-CM

## 2011-02-07 DIAGNOSIS — R609 Edema, unspecified: Secondary | ICD-10-CM

## 2011-02-07 MED ORDER — DULOXETINE HCL 30 MG PO CPEP: 30.0000 mg | ORAL_CAPSULE | Freq: Every day | ORAL | Status: DC

## 2011-02-07 MED ORDER — DULOXETINE HCL 60 MG PO CPEP: 60.0000 mg | ORAL_CAPSULE | Freq: Every day | ORAL | Status: DC

## 2011-02-07 MED ORDER — SERTRALINE HCL 50 MG PO TABS: 50.0000 mg | ORAL_TABLET | Freq: Every day | ORAL | Status: DC

## 2011-02-07 NOTE — Patient Instructions (Addendum)
Depression You have signs of depression. This is a common problem. It can occur at any age. It is often hard to recognize. People can suffer from depression and still have moments of enjoyment. Depression interferes with your basic ability to function in life. It upsets your relationships, sleep, eating, and work habits. CAUSES Depression is believed to be caused by an imbalance in brain chemicals. It may be triggered by an unpleasant event. Relationship crises, a death in the family, financial worries, retirement, or other stressors are normal causes of depression. Depression may also start for no known reason. Other factors that may play a part include medical illnesses, some medicines, genetics, and alcohol or drug abuse. SYMPTOMS  Feeling unhappy or worthless.   Long-lasting (chronic) tiredness or worn-out feeling.   Self-destructive thoughts and actions.   Not being able to sleep or sleeping too much.   Eating more than usual or not eating at all.   Headaches or feeling anxious.   Trouble concentrating or making decisions.   Unexplained physical problems and substance abuse.  TREATMENT Depression usually gets better with treatment. This can include:  Antidepressant medicines. It can take weeks before the proper dose is achieved and benefits are reached.   Talking with a therapist, clergyperson, counselor, or friend. These people can help you gain insight into your problem and regain control of your life.   Eating a good diet.   Getting regular physical exercise, such as walking for 30 minutes every day.   Not abusing alcohol or drugs.  Treating depression often takes 6 months or longer. This length of treatment is needed to keep symptoms from returning. Call your caregiver and arrange for follow-up care as suggested. SEEK IMMEDIATE MEDICAL CARE IF:  You start to have thoughts of hurting yourself or others.   Call your local emergency services (911 in [place]   Go to your  local medical emergency department.   Call the National Suicide Prevention Lifeline: 1-800-273-TALK (416)125-3407).  Document Released: 06/10/2005 Document Re-Released: 11/28/2009 [facility] Patient Information 2011 [place], [place].  Call if any concerns

## 2011-02-10 ENCOUNTER — Encounter: Payer: Self-pay | Admitting: Family Medicine

## 2011-02-11 ENCOUNTER — Encounter: Payer: Self-pay | Admitting: Family Medicine

## 2011-02-11 NOTE — Assessment & Plan Note (Signed)
Persistent despite holding on the Simvastatin, minimize weight bearing, try ice and Aspercreme and call if worsens

## 2011-02-11 NOTE — Progress Notes (Signed)
GAE BIHL [id number] 1966/06/06 02/11/2011      Progress Note-Follow Up  Subjective  Chief Complaint  Chief Complaint  Patient presents with  . Abdominal Pain    X first of the month  . Nausea    X first of the month     HPI  Patient is a 45 yo Caucasian female who is in today to discuss several concerns. She has increased GI upset with the recent changes in meds the symptoms are tolerable but she has been noting increased abdominal cramping, nausea, bloating. No diarrhea/vomitting/constipation/bloody or tarry stool. Is eating some yogurt and has tried to increase the roughage in her diet. Nofevers/chills/Cp/palp/SOB/GU c/o. She has restarted the Zoloft and unfortunately she does not feel that it is helping the OCD as much as it did in the past. She felt the Cymbalta might have helped her Fibromyalgia more but her OCD seemed to get worse. She is also noting some mild increase in pedal edema and pain/stinging in her legs as a result. Has not tried elevated her feet or any topical treatments thus far.  Past Medical History  Diagnosis Date  . Anxiety   . Depression     no counseling  . GERD (gastroesophageal reflux disease)   . Allergy     year round  . Hyperlipidemia 2002  . IBS (irritable bowel syndrome) 2008    improved over last couple of years using Align  . Interstitial cystitis 2006    Dr Marcine Matar  . Pulmonary nodule 3/24/20101    Incidental finding of CT Chest ordered for chest pain  . Personal history of kidney stones 1986  . Asthma     mild, intermittent, allergy triggered  . Obesity   . Fibromyalgia 1997    dx by rheumatology, no recent flares  . Hiatal hernia   . Nasal lesion 10/24/2010  . PULMONARY NODULE 02/10/2009  . OBESITY 02/10/2009  . NECK PAIN 12/15/2009  . HYPERLIPIDEMIA 02/10/2009  . GERD 02/10/2009  . DEPRESSION 02/10/2009  . Chronic laryngitis 04/26/2009  . ANXIETY 02/10/2009  . ALLERGIC RHINITIS 02/10/2009  . Rash 10/24/2010  . Knee pain,  right 12/01/2010    Past Surgical History  Procedure Date  . Kidney stone surgery 1986  . Dilation and curettage of uterus 2002  . Tonsillectomy   . Polypectomy     uterine    Family History  Problem Relation Age of Onset  . Cancer Mother     breast/ ovarian  . Diabetes Mother   . Hyperlipidemia Mother   . Hypertension Mother   . Cancer Maternal Aunt     colon  . Arthritis Paternal Grandmother   . Heart disease Paternal Grandmother   . Stroke Father 25  . Migraines Sister   . Cancer Maternal Grandmother   . Cancer Maternal Grandfather     History   Social History  . Marital Status: Married    Spouse Name: N/A    Number of Children: 2  . Years of Education: N/A   Occupational History  . unemployed    Social History Main Topics  . Smoking status: Former Games developer  . Smokeless tobacco: Never Used   Comment: when a teenager  . Alcohol Use: No  . Drug Use: No  . Sexually Active: Yes -- Female partner(s)   Other Topics Concern  . Not on file   Social History Narrative  . No narrative on file    Current Outpatient Prescriptions on File Prior to  Visit  Medication Sig Dispense Refill  . albuterol (PROAIR HFA) 108 (90 BASE) MCG/ACT inhaler Inhale 1 -2 puffs by mouth every 4-6 hours as needed.       . ALPRAZolam (XANAX) 0.5 MG tablet Take 1 tablet (0.5 mg total) by mouth 2 (two) times daily as needed.  60 tablet  1  . bifidobacterium infantis (ALIGN) capsule Take 1 capsule by mouth daily.        . clidinium-chlordiazepoxide (LIBRAX) 2.5-5 MG per capsule Take 1 capsule by mouth 2 (two) times daily. As needed.       . cyclobenzaprine (FLEXERIL) 10 MG tablet Take 1 tablet (10 mg total) by mouth every 8 (eight) hours as needed for muscle spasms.  30 tablet  1  . fluticasone (FLONASE) 50 MCG/ACT nasal spray 2 sprays by Nasal route daily. Each nostril.  180 Act  1  . levocetirizine (XYZAL) 5 MG tablet Take 5 mg by mouth daily.        . norethindrone-ethinyl estradiol (JUNEL FE  1/20) 1-20 MG-MCG per tablet Take 1 tablet by mouth daily.        Marland Kitchen omeprazole-sodium bicarbonate (ZEGERID) 40-1100 MG per capsule Take 1 capsule by mouth daily.        . SUMAtriptan (IMITREX) 25 MG tablet       . VIVELLE-DOT 0.05 MG/24HR       . Multiple Vitamin (MULTIVITAMIN) tablet Take 1 tablet by mouth daily.          Allergies  Allergen Reactions  . Venlafaxine     REACTION: bad dreams    Review of Systems  Review of Systems  Constitutional: Negative for fever and malaise/fatigue.  HENT: Positive for neck pain. Negative for congestion.   Eyes: Negative for discharge.  Respiratory: Negative for shortness of breath.   Cardiovascular: Negative for chest pain, palpitations and leg swelling.  Gastrointestinal: Positive for heartburn, nausea and abdominal pain. Negative for diarrhea, constipation, blood in stool and melena.  Genitourinary: Negative for dysuria.  Musculoskeletal: Positive for joint pain. Negative for myalgias, back pain and falls.       Right knee pain persists  Skin: Negative for rash.  Neurological: Negative for loss of consciousness and headaches.  Endo/Heme/Allergies: Negative for polydipsia.  Psychiatric/Behavioral: Positive for depression. Negative for suicidal ideas and substance abuse. The patient is nervous/anxious and has insomnia.     Objective  BP 143/82  Pulse 85  Temp(Src) 97.8 F (36.6 C) (Oral)  Ht 5' 11.5" (1.816 m)  Wt 258 lb 6.4 oz (117.209 kg)  BMI 35.54 kg/m2  SpO2 96%  LMP 01/09/2011  Physical Exam  Physical Exam  Constitutional: She is oriented to person, place, and time and well-developed, well-nourished, and in no distress. No distress.  HENT:  Head: Normocephalic and atraumatic.  Eyes: Conjunctivae are normal.  Neck: Neck supple. No thyromegaly present.  Cardiovascular: Normal rate, regular rhythm and normal heart sounds.   No murmur heard. Pulmonary/Chest: Effort normal and breath sounds normal. She has no wheezes.    Abdominal: She exhibits no distension and no mass.  Musculoskeletal: She exhibits no edema.  Lymphadenopathy:    She has no cervical adenopathy.  Neurological: She is alert and oriented to person, place, and time.  Skin: Skin is warm and dry. No rash noted. She is not diaphoretic.  Psychiatric: Memory, affect and judgment normal.    Lab Results  Component Value Date   TSH 2.32 11/23/2010   Lab Results  Component Value Date  WBC 9.7 03/31/2009   HGB 12.2 03/31/2009   HCT 39.0 03/31/2009   MCV 87.1 03/31/2009   PLT 315 03/31/2009   Lab Results  Component Value Date   CREATININE 0.99 03/31/2009   BUN 12 03/31/2009   NA 140 03/31/2009   K 4.4 03/31/2009   CL 104 03/31/2009   CO2 21 03/31/2009   Lab Results  Component Value Date   ALT 14 03/31/2009   AST 17 03/31/2009   ALKPHOS 92 03/31/2009   BILITOT 0.3 03/31/2009   Lab Results  Component Value Date   CHOL 197 11/23/2010   Lab Results  Component Value Date   HDL 38.90* 11/23/2010   Lab Results  Component Value Date   LDLCALC 117* 03/31/2009   Lab Results  Component Value Date   TRIG 203.0* 11/23/2010   Lab Results  Component Value Date   CHOLHDL 5 11/23/2010     Assessment & Plan  DEPRESSION Patient not feeling as if the Zoloft is working as well as it has in the past for her anxiety/depression or OCD. Will try a low dose of Cymbalta with the Zoloft, start with Cymbalta 30mg  po qd and Zoloft 50 mg qd then if tolerated in a week will increase Cymbalta to 60mg  and drop Zoloft to 25mg  and reassess at next visit.  IBS (irritable bowel syndrome) Symptoms worse recently with the changes in meds, encouraged a probiotic, ginger products, yogurt, avoid fatty and spicy foods and start a fiber supplement. Continue Zegerid at bid for now and may need H Pylori testing if symptoms persist.  Knee pain, right Persistent despite holding on the Simvastatin, minimize weight bearing, try ice and Aspercreme and call if  worsens  HYPERLIPIDEMIA Feels her muscle pain is better since holding her Simvastatin, will continue to hold for now til she feels better then consider restarting one more time  Pedal edema Minimize sodium, elevate feet above heart and consider Jobst stockings on in am off in pm

## 2011-02-11 NOTE — Assessment & Plan Note (Signed)
Patient not feeling as if the Zoloft is working as well as it has in the past for her anxiety/depression or OCD. Will try a low dose of Cymbalta with the Zoloft, start with Cymbalta 30mg  po qd and Zoloft 50 mg qd then if tolerated in a week will increase Cymbalta to 60mg  and drop Zoloft to 25mg  and reassess at next visit.

## 2011-02-11 NOTE — Assessment & Plan Note (Signed)
Symptoms worse recently with the changes in meds, encouraged a probiotic, ginger products, yogurt, avoid fatty and spicy foods and start a fiber supplement. Continue Zegerid at bid for now and may need H Pylori testing if symptoms persist.

## 2011-02-11 NOTE — Assessment & Plan Note (Signed)
Feels her muscle pain is better since holding her Simvastatin, will continue to hold for now til she feels better then consider restarting one more time

## 2011-02-11 NOTE — Assessment & Plan Note (Signed)
Minimize sodium, elevate feet above heart and consider Jobst stockings on in am off in pm

## 2011-02-12 ENCOUNTER — Telehealth: Payer: Self-pay

## 2011-02-12 NOTE — Telephone Encounter (Signed)
Pt informed

## 2011-02-12 NOTE — Telephone Encounter (Signed)
FYI: Pt states she came off of the Cymbalta because it makes her sweat really bad. Pt states she is going to try 100mg  of Zoloft and see how that does.

## 2011-02-12 NOTE — Telephone Encounter (Signed)
OK, if no improvement she can come in and we can try something else altogether

## 2011-02-22 ENCOUNTER — Encounter: Payer: Self-pay | Admitting: Family Medicine

## 2011-02-22 ENCOUNTER — Ambulatory Visit (INDEPENDENT_AMBULATORY_CARE_PROVIDER_SITE_OTHER): Payer: Private Health Insurance - Indemnity | Admitting: Family Medicine

## 2011-02-22 DIAGNOSIS — F329 Major depressive disorder, single episode, unspecified: Secondary | ICD-10-CM

## 2011-02-22 DIAGNOSIS — R600 Localized edema: Secondary | ICD-10-CM

## 2011-02-22 DIAGNOSIS — R609 Edema, unspecified: Secondary | ICD-10-CM

## 2011-02-22 DIAGNOSIS — E669 Obesity, unspecified: Secondary | ICD-10-CM

## 2011-02-22 DIAGNOSIS — M25569 Pain in unspecified knee: Secondary | ICD-10-CM

## 2011-02-22 DIAGNOSIS — R5383 Other fatigue: Secondary | ICD-10-CM

## 2011-02-22 DIAGNOSIS — M25561 Pain in right knee: Secondary | ICD-10-CM

## 2011-02-22 DIAGNOSIS — R1011 Right upper quadrant pain: Secondary | ICD-10-CM

## 2011-02-22 DIAGNOSIS — E663 Overweight: Secondary | ICD-10-CM

## 2011-02-22 DIAGNOSIS — K219 Gastro-esophageal reflux disease without esophagitis: Secondary | ICD-10-CM

## 2011-02-22 DIAGNOSIS — E785 Hyperlipidemia, unspecified: Secondary | ICD-10-CM

## 2011-02-22 DIAGNOSIS — R5381 Other malaise: Secondary | ICD-10-CM

## 2011-02-22 DIAGNOSIS — R1013 Epigastric pain: Secondary | ICD-10-CM

## 2011-02-22 LAB — RENAL FUNCTION PANEL
Albumin: 3.7 g/dL (ref 3.5–5.2)
BUN: 14 mg/dL (ref 6–23)
Creatinine, Ser: 1 mg/dL (ref 0.4–1.2)
GFR: 63.58 mL/min (ref >60.00)
Glucose, Bld: 86 mg/dL (ref 70–99)
Phosphorus: 2.9 mg/dL (ref 2.3–4.6)

## 2011-02-22 LAB — CBC WITH DIFFERENTIAL/PLATELET
Basophils Absolute: 0 10*3/uL (ref 0.0–0.1)
Basophils Relative: 0.4 % (ref 0.0–3.0)
Eosinophils Absolute: 0.2 10*3/uL (ref 0.0–0.7)
HCT: 40 % (ref 36.0–46.0)
Hemoglobin: 13.2 g/dL (ref 12.0–15.0)
Lymphocytes Relative: 36.5 % (ref 12.0–46.0)
Lymphs Abs: 3.5 10*3/uL (ref 0.7–4.0)
MCHC: 33.1 g/dL (ref 30.0–36.0)
MCV: 85.9 fl (ref 78.0–100.0)
Monocytes Absolute: 0.5 10*3/uL (ref 0.1–1.0)
Neutro Abs: 5.3 10*3/uL (ref 1.4–7.7)
RBC: 4.66 Mil/uL (ref 3.87–5.11)
RDW: 14.9 % — ABNORMAL HIGH (ref 11.5–14.6)

## 2011-02-22 LAB — LIPID PANEL
Cholesterol: 283 mg/dL — ABNORMAL HIGH (ref 0–200)
HDL: 38.6 mg/dL — ABNORMAL LOW (ref >39.00)
Triglycerides: 333 mg/dL — ABNORMAL HIGH (ref 0.0–149.0)

## 2011-02-22 LAB — TSH: TSH: 0.76 u[IU]/mL (ref 0.35–5.50)

## 2011-02-22 LAB — HEPATIC FUNCTION PANEL
Albumin: 3.7 g/dL (ref 3.5–5.2)
Alkaline Phosphatase: 108 U/L (ref 39–117)
Total Protein: 7.4 g/dL (ref 6.0–8.3)

## 2011-02-22 MED ORDER — RANITIDINE HCL 300 MG PO TABS: 300.0000 mg | ORAL_TABLET | Freq: Every day | ORAL | Status: DC

## 2011-02-22 NOTE — Progress Notes (Signed)
Mallory [name] [id number] 07-Dec-1965 02/22/2011      Progress Note-Follow Up  Subjective  Chief Complaint  Chief Complaint  Patient presents with  . Follow-up    2 week follow up    HPI  Patient is in today for a quick followup with multiple ongoing issues. Her OCD, stress and anxiety, depression persist. She notes worsening stressors at home and at work. As to the status on her Zoloft at 50 for now due to her other concerns. Did not like duloxetine. Is using alprazolam only each bedtime. Denies suicidal ideation. No fevers, chills, palpitations, shortness of breath. She is having intermittent migraines most notably during her periods. Previously her OB/GYN has maintained her on OCPs during the month and given her Vivelle-Dot for her week of placebo pills. She notes previously this has controlled her headaches but this last month it was unsuccessful. No bleeding or clotting issues but she is beginning to have some intermittent instability and hot flashes and is concerned she is heading into perimenopause. She is also noting worsening abdominal pain really bilateral upper quadrants but worse in the epigastrium and right upper quadrant. She does she is a nagging hungry feeling a lot better than what she eats the pain-this. Some nodules present she was having a burning substernally. She increased her Zegerid to twice a day and the burning is better but she still has some throat irritation and dyspepsia/burping. She denies any bloody or tarry stool. Tends to move her bowels roughly every day and that is not changed. No GU complaints at today's visit  Past Medical History  Diagnosis Date  . Anxiety   . Depression     no counseling  . GERD (gastroesophageal reflux disease)   . Allergy     year round  . Hyperlipidemia 2002  . IBS (irritable bowel syndrome) 2008    improved over last couple of years using Align  . Interstitial cystitis 2006    Dr Marcine Matar  . Pulmonary nodule 3/24/20101      Incidental finding of CT Chest ordered for chest pain  . Personal history of kidney stones 1986  . Asthma     mild, intermittent, allergy triggered  . Obesity   . Fibromyalgia 1997    dx by rheumatology, no recent flares  . Hiatal hernia   . Nasal lesion 10/24/2010  . PULMONARY NODULE 02/10/2009  . OBESITY 02/10/2009  . NECK PAIN 12/15/2009  . HYPERLIPIDEMIA 02/10/2009  . GERD 02/10/2009  . DEPRESSION 02/10/2009  . Chronic laryngitis 04/26/2009  . ANXIETY 02/10/2009  . ALLERGIC RHINITIS 02/10/2009  . Rash 10/24/2010  . Knee pain, right 12/01/2010  . Pedal edema 02/11/2011  . RUQ pain 02/22/2011    Past Surgical History  Procedure Date  . Kidney stone surgery 1986  . Dilation and curettage of uterus 2002  . Tonsillectomy   . Polypectomy     uterine    Family History  Problem Relation Age of Onset  . Cancer Mother     breast/ ovarian  . Diabetes Mother   . Hyperlipidemia Mother   . Hypertension Mother   . Cancer Maternal Aunt     colon  . Arthritis Paternal Grandmother   . Heart disease Paternal Grandmother   . Stroke Father 3  . Migraines Sister   . Cancer Maternal Grandmother   . Cancer Maternal Grandfather     History   Social History  . Marital Status: Married    Spouse Name: N/A  Number of Children: 2  . Years of Education: N/A   Occupational History  . unemployed    Social History Main Topics  . Smoking status: Former Games developer  . Smokeless tobacco: Never Used   Comment: when a teenager  . Alcohol Use: No  . Drug Use: No  . Sexually Active: Yes -- Female partner(s)   Other Topics Concern  . Not on file   Social History Narrative  . No narrative on file    Current Outpatient Prescriptions on File Prior to Visit  Medication Sig Dispense Refill  . albuterol (PROAIR HFA) 108 (90 BASE) MCG/ACT inhaler Inhale 1 -2 puffs by mouth every 4-6 hours as needed.       . ALPRAZolam (XANAX) 0.5 MG tablet Take 1 tablet (0.5 mg total) by mouth 2 (two) times daily as  needed.  60 tablet  1  . bifidobacterium infantis (ALIGN) capsule Take 1 capsule by mouth daily.        . clidinium-chlordiazepoxide (LIBRAX) 2.5-5 MG per capsule Take 1 capsule by mouth 2 (two) times daily. As needed.       . cyclobenzaprine (FLEXERIL) 10 MG tablet Take 1 tablet (10 mg total) by mouth every 8 (eight) hours as needed for muscle spasms.  30 tablet  1  . levocetirizine (XYZAL) 5 MG tablet Take 5 mg by mouth daily.        . Multiple Vitamin (MULTIVITAMIN) tablet Take 1 tablet by mouth daily.        . norethindrone-ethinyl estradiol (JUNEL FE 1/20) 1-20 MG-MCG per tablet Take 1 tablet by mouth daily.        Marland Kitchen omeprazole-sodium bicarbonate (ZEGERID) 40-1100 MG per capsule Take 1 capsule by mouth daily.        . sertraline (ZOLOFT) 50 MG tablet Take 100 mg by mouth daily.        . SUMAtriptan (IMITREX) 25 MG tablet       . VIVELLE-DOT 0.05 MG/24HR         Allergies  Allergen Reactions  . Venlafaxine     REACTION: bad dreams    Review of Systems  Review of Systems  Constitutional: Positive for malaise/fatigue. Negative for fever.  HENT: Positive for sore throat. Negative for congestion.   Eyes: Negative for discharge.  Respiratory: Negative for sputum production and shortness of breath.   Cardiovascular: Negative for chest pain, palpitations and leg swelling.  Gastrointestinal: Positive for heartburn, nausea and abdominal pain. Negative for vomiting, diarrhea, constipation, blood in stool and melena.  Genitourinary: Negative for dysuria.  Musculoskeletal: Negative for falls.  Skin: Negative for rash.  Neurological: Positive for headaches. Negative for loss of consciousness.  Endo/Heme/Allergies: Negative for polydipsia.  Psychiatric/Behavioral: Positive for depression. Negative for suicidal ideas. The patient is nervous/anxious. The patient does not have insomnia.     Objective  BP 112/74  Pulse 90  Temp(Src) 97.8 F (36.6 C) (Oral)  Ht 5' 11.5" (1.816 m)  Wt 258  lb (117.028 kg)  BMI 35.48 kg/m2  SpO2 96%  Physical Exam  Physical Exam  Constitutional: She is oriented to person, place, and time and well-developed, well-nourished, and in no distress. No distress.  HENT:  Head: Normocephalic and atraumatic.  Eyes: Conjunctivae are normal.  Neck: Neck supple. No thyromegaly present.  Cardiovascular: Normal rate, regular rhythm and normal heart sounds.   No murmur heard. Pulmonary/Chest: Effort normal and breath sounds normal. She has no wheezes.  Abdominal: She exhibits no distension and no mass.  Musculoskeletal: She exhibits no edema.  Lymphadenopathy:    She has no cervical adenopathy.  Neurological: She is alert and oriented to person, place, and time.  Skin: Skin is warm and dry. No rash noted. She is not diaphoretic.  Psychiatric: Memory, affect and judgment normal.    Lab Results  Component Value Date   TSH 2.32 11/23/2010   Lab Results  Component Value Date   WBC 9.7 03/31/2009   HGB 12.2 03/31/2009   HCT 39.0 03/31/2009   MCV 87.1 03/31/2009   PLT 315 03/31/2009   Lab Results  Component Value Date   CREATININE 0.99 03/31/2009   BUN 12 03/31/2009   NA 140 03/31/2009   K 4.4 03/31/2009   CL 104 03/31/2009   CO2 21 03/31/2009   Lab Results  Component Value Date   ALT 14 03/31/2009   AST 17 03/31/2009   ALKPHOS 92 03/31/2009   BILITOT 0.3 03/31/2009   Lab Results  Component Value Date   CHOL 197 11/23/2010   Lab Results  Component Value Date   HDL 38.90* 11/23/2010   Lab Results  Component Value Date   LDLCALC 117* 03/31/2009   Lab Results  Component Value Date   TRIG 203.0* 11/23/2010   Lab Results  Component Value Date   CHOLHDL 5 11/23/2010     Assessment & Plan  RUQ pain With worsening right upper quadrant and epigastric pain. I described a lot of dyspepsia, throat irritation, but then. [name] recently doubled her Zegerid to twice a day and had some partial relief. No carotid quadrant ultrasound to rule out gallbladder  disease. There is no pain is worse after eating generally. She will avoid spicy and fatty foods for now and we will reassess after the labs  Pedal edema Improved at present no change today.  OBESITY Patient is frustrated with her weight. Agrees to restart Weight Watchers. Patient acknowledges not skipping meals and monitoring fluids regularly he is encouraged to try small frequent healthy snacks and meals every 4 hours. Also increase hydration and we will check a TSH at today's visit.  Knee pain, right Unfortunately the pain is persistent in right knee and she is having some difficulty with stiffness and pain in the left knee as well. This is at least partially related to her persistent weight gain. She will continue to work on her right stays active as tolerated and we will monitor her  HYPERLIPIDEMIA Patient has been off of her simvastatin. She felt weak in her lower extremities and achy and thought that the simvastatin was contributing. She is noncommittal but does think it is slightly better since she help the simvastatin. She notes previously taking lovastatin and Lipitor and having adverse reactions to those. We'll check a lipid panel today and consider Crestor to get stiff there metabolic profile if her numbers remain elevated. She is to avoid trans-fats minimize saturated fats  GERD Symptoms worsening. We'll have her take Zegerid just once a day but add ranitidine 300 mg each bedtime. Avoid offending foods and eats small frequent meals. Do not eat close to bedtime.  DEPRESSION Patient has chosen to stay just on her Zoloft at 50 mg thus far due to all of her other difficulties at present. We'll maintain the Zoloft at 50 unless the patient called saying she is significantly worse we'll wait about 400 until she is improved in other aspects of her life. They continue to use alprazolam when necessary. Routinely takes only each bedtime and is given  permission to try half a tablet during the day  to see if she gets some relief with stress heightens.

## 2011-02-22 NOTE — Assessment & Plan Note (Signed)
Patient has chosen to stay just on her Zoloft at 50 mg thus far due to all of her other difficulties at present. We'll maintain the Zoloft at 50 unless the patient called saying she is significantly worse we'll wait about 400 until she is improved in other aspects of her life. They continue to use alprazolam when necessary. Routinely takes only each bedtime and is given permission to try half a tablet during the day to see if she gets some relief with stress heightens.

## 2011-02-22 NOTE — Assessment & Plan Note (Signed)
Patient is frustrated with her weight. Agrees to restart Weight Watchers. Patient acknowledges not skipping meals and monitoring fluids regularly he is encouraged to try small frequent healthy snacks and meals every 4 hours. Also increase hydration and we will check a TSH at today's visit.

## 2011-02-22 NOTE — Patient Instructions (Signed)
Heartburn  Heartburn is a painful, burning sensation in the chest. It may feel worse in certain positions, such as lying down or bending over. It is caused by stomach acid backing up into the tube that carries food from the mouth down to the stomach (lower esophagus).    CAUSES  A number of conditions can cause or worsen heartburn, including:     Pregnancy.    Being overweight (obesity).    A condition called hiatal hernia, in which part or all of the stomach is moved up into the chest through a weakness in the diaphragm muscle.    Alcohol.    Exercise.    Eating just before going to bed.    Overeating.    Medications, including:    Nonsteroidal anti-inflammatory drugs, such as ibuprofen and naproxen.    Aspirin.    Some blood pressure medicines, including beta-blockers, calcium channel blockers, and alpha-blockers.    Nitrates (used to treat angina).    The asthma medication Theophylline.    Certain sedative drugs.    Heartburn may be worse after eating certain foods. These heartburn-causing foods are different for different people, but may include:    Peppers.    Chocolate.    Coffee.    High-fat foods, including fried foods.    Spicy foods.    Garlic, onions.    Citrus fruits, including oranges, grapefruit, lemons and limes.    Food containing tomatoes or tomato products.    Mint.    Carbonated beverages.    Vinegar.   SYMPTOMS    Symptoms may last for a few minutes or a few hours, and can include:   Burning pain in the chest or lower throat.    Bitter taste in the mouth.    Coughing.   DIAGNOSIS  If the usual treatments for heartburn do not improve your symptoms, then tests may be done to see if there is another condition present. Possible tests may include:   X-rays.    Endoscopy. This is when a tube with a light and a camera on the end is used to examine the esophagus and the stomach.    Blood, breath, or stool tests may be used to check for bacteria that cause ulcers.    TREATMENT  There are a number of non-prescription medicines used to treat heartburn, including:   Antacids.    Acid reducers (also called H-2 blockers).    Proton-pump inhibitors.   HOME CARE INSTRUCTIONS   Raise the head of your bed by putting blocks under the legs.    Eat 2-3 hours before going to bed.    Stop smoking.    Try to reach and maintain a healthy weight.    Do not eat just a few very large meals. Instead, eat many smaller meals throughout the day.    Try to identify foods and beverages that make your symptoms worse, and avoid these.    Avoid tight clothing.    Do not exercise right after eating.   SEEK IMMEDIATE MEDICAL CARE IF YOU:   Have severe chest pain that goes down your arm, or into your jaw or neck.    Feel sweaty, dizzy, or lightheaded.    Are short of breath.    Throw up (vomit) blood.    Have difficulty or pain with swallowing.    Have bloody or black, tarry stools.    Have bouts of heartburn more than three times a week for more than two weeks.   

## 2011-02-22 NOTE — Assessment & Plan Note (Signed)
Improved at present no change today.

## 2011-02-22 NOTE — Assessment & Plan Note (Signed)
Patient has been off of her simvastatin. She felt weak in her lower extremities and achy and thought that the simvastatin was contributing. She is noncommittal but does think it is slightly better since she help the simvastatin. She notes previously taking lovastatin and Lipitor and having adverse reactions to those. We'll check a lipid panel today and consider Crestor to get stiff there metabolic profile if her numbers remain elevated. She is to avoid trans-fats minimize saturated fats

## 2011-02-22 NOTE — Assessment & Plan Note (Signed)
With worsening right upper quadrant and epigastric pain. I described a lot of dyspepsia, throat irritation, but then. [name] recently doubled her Zegerid to twice a day and had some partial relief. No carotid quadrant ultrasound to rule out gallbladder disease. There is no pain is worse after eating generally. She will avoid spicy and fatty foods for now and we will reassess after the labs

## 2011-02-22 NOTE — Assessment & Plan Note (Signed)
Symptoms worsening. We'll have her take Zegerid just once a day but add ranitidine 300 mg each bedtime. Avoid offending foods and eats small frequent meals. Do not eat close to bedtime.

## 2011-02-22 NOTE — Assessment & Plan Note (Signed)
Unfortunately the pain is persistent in right knee and she is having some difficulty with stiffness and pain in the left knee as well. This is at least partially related to her persistent weight gain. She will continue to work on her right stays active as tolerated and we will monitor her

## 2011-02-26 ENCOUNTER — Other Ambulatory Visit: Payer: Self-pay

## 2011-02-26 ENCOUNTER — Ambulatory Visit
Admission: RE | Admit: 2011-02-26 | Discharge: 2011-02-26 | Disposition: A | Discharge status: Home / Self Care | Payer: Private Health Insurance - Indemnity | Source: Ambulatory Visit | Attending: Family Medicine | Admitting: Family Medicine

## 2011-02-26 DIAGNOSIS — R1013 Epigastric pain: Secondary | ICD-10-CM

## 2011-02-26 MED ORDER — RANITIDINE HCL 300 MG PO TABS: 300.0000 mg | ORAL_TABLET | Freq: Every day | ORAL | Status: DC

## 2011-02-26 NOTE — Telephone Encounter (Signed)
Pt called and stated [facility] doesn't have the Ran[facility]idine RX. I will resend it to the pharmacy

## 2011-02-27 ENCOUNTER — Telehealth: Payer: Self-pay

## 2011-02-27 ENCOUNTER — Other Ambulatory Visit: Payer: Self-pay

## 2011-02-27 MED ORDER — ROSUVASTATIN CALCIUM 20 MG PO TABS
20.0000 mg | ORAL_TABLET | Freq: Every day | ORAL | Status: DC
Start: 2011-02-27 — End: 2011-05-13

## 2011-02-27 NOTE — Telephone Encounter (Signed)
RX sent to pharmacy  

## 2011-03-05 ENCOUNTER — Other Ambulatory Visit: Payer: Self-pay | Admitting: Family Medicine

## 2011-03-13 ENCOUNTER — Other Ambulatory Visit: Payer: Private Health Insurance - Indemnity | Admitting: Family Medicine

## 2011-03-20 ENCOUNTER — Encounter: Payer: Self-pay | Admitting: Family Medicine

## 2011-03-20 ENCOUNTER — Ambulatory Visit (INDEPENDENT_AMBULATORY_CARE_PROVIDER_SITE_OTHER): Payer: Private Health Insurance - Indemnity | Admitting: Family Medicine

## 2011-03-20 DIAGNOSIS — Z23 Encounter for immunization: Secondary | ICD-10-CM

## 2011-03-20 DIAGNOSIS — M25561 Pain in right knee: Secondary | ICD-10-CM

## 2011-03-20 DIAGNOSIS — F3289 Other specified depressive episodes: Secondary | ICD-10-CM

## 2011-03-20 DIAGNOSIS — F329 Major depressive disorder, single episode, unspecified: Secondary | ICD-10-CM

## 2011-03-20 DIAGNOSIS — E669 Obesity, unspecified: Secondary | ICD-10-CM

## 2011-03-20 DIAGNOSIS — N946 Dysmenorrhea, unspecified: Secondary | ICD-10-CM

## 2011-03-20 DIAGNOSIS — M25569 Pain in unspecified knee: Secondary | ICD-10-CM

## 2011-03-20 DIAGNOSIS — K219 Gastro-esophageal reflux disease without esophagitis: Secondary | ICD-10-CM

## 2011-03-20 DIAGNOSIS — N809 Endometriosis, unspecified: Secondary | ICD-10-CM | POA: Insufficient documentation

## 2011-03-20 MED ORDER — IBUPROFEN 400 MG PO TABS: 400.0000 mg | ORAL_TABLET | Freq: Three times a day (TID) | ORAL | Status: DC | PRN

## 2011-03-20 NOTE — Assessment & Plan Note (Signed)
Has started to exercising more. Trying to watch her po intake.

## 2011-03-20 NOTE — Assessment & Plan Note (Addendum)
Has severe dysmenorrhea and is being managed by Dr Edward Jolly, OB/GYN they recommended a hysterectomy and bladder a tack but she cannot proceed now due to work constraints, she will consider in 2013, for now she is encouraged to try taking Ibuprofen twice daily starting the day before her menses and for the first 2 days. If her stomach starts to act up she will stop it. May use Vicodin prn for severe pain

## 2011-03-20 NOTE — Patient Instructions (Addendum)
Social Anxiety Disorder Almost everyone can feel some degree of discomfort in a given social situation. However, when you feel extreme fear of social encounters you have Social Anxiety Disorder. There are two types of the disorder.  If you have the first type, you are extremely anxious in a FEW SPECIFIC situations. For instance, you become anxious when answering a question out loud in class or presenting at work.   If you have the second type, you experience overwhelming worry in MOST OR ALL social experiences. This includes everything from going to a doctor's appointment, eating in a restaurant, or entering a crowded room.  When this disorder happens in very young children, it may be from a new Arts administrator or stranger that the child is not used to. In the very young it may show up as crying, tantrums, and/or withdrawal. The very young do not understand at all what their panic is about. Most adults with marked Social Anxiety were shy and timid as children. If left untreated these adults are seen as quiet and passive in social situations. They are highly sensitive to the criticism or disapproval of others. They may have no close friends outside of first degree relatives. They are fearful of saying or doing something foolish or becoming emotional in front of others. As a result of these fears they avoid most social encounters and select jobs and personal activities that allow them to isolate and do solitary activities.  CAUSES A number of factors coming together at the same time can cause the disorder. For instance,  Your genetic makeup plays a part in how sensitive prone you are or how much stress you can tolerate.   How you were raised as a child also plays a part. Research suggests that children raised with overprotective parents, excessive expectations, overly critical parents, low assertiveness, and /or emotional insecurity increases feelings of anxiety.   A life event that felt traumatic can also  play a part. For example, if you were pointed out and shamed in public or if you were bullied time and time again by classmates on the playground.  SYMPTOMS With this disorder, there is a fear of social situations. This anxiety is marked by:  Apprehension.   Nervousness.  It is a feeling of unease, worry and tension. Anxiety may also be reflected in:  Blushing.  Restlessness.   Trembling.  Shortness of breath.   Sweaty palms.   Muscular tics and twitches.   At higher levels of anxiety there is also:  Increased heart rate.   A rise in blood pressure.   Rapid breathing and muscle tensions  Eventually, when the feelings come often enough, you may try to avoid these situations. Eventually this can cause a whole range of problems that come with trying to avoid situations which make you uncomfortable. TREATMENT There are many treatments available for this. A few of these treatments are:  Group therapy allows you to see you are not alone with these problems.   Individual therapy helps you address longstanding issues with a caring professional.   Relaxation techniques help you walk through fear with new tools.   Hypnosis changes thinking about social settings of discomfort.   Numerous medications are available that your caregiver can prescribe to help during a difficult time. Medications can be used for a brief period of time. This treatment reconditions you so that once off the medications, the problems disappear.  PROGNOSIS Humans, in years past, used their uneasiness and worries about strange situations  to help them survive in hostile settings. This is a normal reaction. It served them well. It only becomes a problem when the uneasiness and worry becomes extreme and begins to interfere with daily functioning. This problem is common and very treatable. Individuals who participate in treatment have a very high success rate. Prognosis is very good to excellent. Document Released:  05/09/2005 Document Re-Released: 03/19/2008 ExitCare Patient Information 2011 ExitCare, L  Restart Fish oil, Megared daily  Try ice and Aspercreme twice daily  Keep exercising  Add a Zyrtec daily

## 2011-03-21 ENCOUNTER — Telehealth: Payer: Self-pay

## 2011-03-21 MED ORDER — IBUPROFEN 400 MG PO TABS: 400.0000 mg | ORAL_TABLET | Freq: Three times a day (TID) | ORAL | Status: AC | PRN

## 2011-03-21 NOTE — Assessment & Plan Note (Signed)
Encouraged ice and Aspercreme bid

## 2011-03-21 NOTE — Assessment & Plan Note (Signed)
The addition of Ranitidine has been helpful she is encouraged to continue avoiding offending foods.

## 2011-03-21 NOTE — Telephone Encounter (Signed)
Patient left a message stating md told her that there would be an RX for Motrin 400 at her pharmacy? Pt also would like to know if she should start taking CO Q 10? If so what strength? Please advise?

## 2011-03-21 NOTE — Telephone Encounter (Signed)
Pt informed

## 2011-03-21 NOTE — Telephone Encounter (Signed)
Yep look at the med module I sent the Ibuprofen 400mg  yesterday, the Co Q 10 is fine, OTC strength is standard.

## 2011-03-21 NOTE — Assessment & Plan Note (Signed)
Patient continues to struggle with depression but is very nervous about altering meds. She has previously been encouraged to call [facility] for some counseling and that is encouraged again today. She agrees to proceed, she is also encouraged to add a daily walk or exercise to her regimen.

## 2011-03-21 NOTE — Progress Notes (Signed)
Mallory [name] [id number] 1966/04/09 03/21/2011      Progress Note-Follow Up  Subjective  Chief Complaint  Chief Complaint  Patient presents with  . Follow-up    3 month follow up    HPI  Patient is a 45 year old Caucasian female who is in today for followup on multiple medical conditions. She noticed her depression continues to be a problem. Denies suicidal ideation. Is apprehensive about changing meds to frequent side effects in the past. Has been encouraged to consider counseling with [facility] and has not proceeded so far. Does acknowledge she thinks she probably should. No recent febrile illness, fevers, chills, chest pain, palpitations. She has been seeing OB/GYN and they have recommended a hysterectomy due to her painful endometriosis and bleeding. She is unable to proceed due to were concerns. Is getting bad headaches secondary to her menses and they have recently change her OCP to see if that helps. She has used a small amount of Vicodin and her bad headaches or cramps become overwhelming and it was mildly helpful. No reflux concerned since adding ranitidine. Her right knee continues to bother her some no swelling or erythema is noted  Past Medical History  Diagnosis Date  . Anxiety   . Depression     no counseling  . GERD (gastroesophageal reflux disease)   . Allergy     year round  . Hyperlipidemia 2002  . IBS (irritable bowel syndrome) 2008    improved over last couple of years using Align  . Interstitial cystitis 2006    Dr Marcine Matar  . Pulmonary nodule 3/24/20101    Incidental finding of CT Chest ordered for chest pain  . Personal history of kidney stones 1986  . Asthma     mild, intermittent, allergy triggered  . Obesity   . Fibromyalgia 1997    dx by rheumatology, no recent flares  . Hiatal hernia   . Nasal lesion 10/24/2010  . PULMONARY NODULE 02/10/2009  . OBESITY 02/10/2009  . NECK PAIN 12/15/2009  . HYPERLIPIDEMIA 02/10/2009  . GERD  02/10/2009  . DEPRESSION 02/10/2009  . Chronic laryngitis 04/26/2009  . ANXIETY 02/10/2009  . ALLERGIC RHINITIS 02/10/2009  . Rash 10/24/2010  . Knee pain, right 12/01/2010  . Pedal edema 02/11/2011  . RUQ pain 02/22/2011  . Endometriosis 03/20/2011    Past Surgical History  Procedure Date  . Kidney stone surgery 1986  . Dilation and curettage of uterus 2002  . Tonsillectomy   . Polypectomy     uterine    Family History  Problem Relation Age of Onset  . Cancer Mother     breast/ ovarian  . Diabetes Mother   . Hyperlipidemia Mother   . Hypertension Mother   . Cancer Maternal Aunt     colon  . Arthritis Paternal Grandmother   . Heart disease Paternal Grandmother   . Stroke Father 89  . Migraines Sister   . Cancer Maternal Grandmother   . Cancer Maternal Grandfather     History   Social History  . Marital Status: Married    Spouse Name: N/A    Number of Children: 2  . Years of Education: N/A   Occupational History  . unemployed    Social History Main Topics  . Smoking status: Former Games developer  . Smokeless tobacco: Never Used   Comment: when a teenager  . Alcohol Use: No  . Drug Use: No  . Sexually Active: Yes -- Female partner(s)   Other  Topics Concern  . Not on file   Social History Narrative  . No narrative on file    Current Outpatient Prescriptions on File Prior to Visit  Medication Sig Dispense Refill  . albuterol (PROAIR HFA) 108 (90 BASE) MCG/ACT inhaler Inhale 1 -2 puffs by mouth every 4-6 hours as needed.       . ALPRAZolam (XANAX) 0.5 MG tablet Take 1 tablet (0.5 mg total) by mouth 2 (two) times daily as needed.  60 tablet  1  . bifidobacterium infantis (ALIGN) capsule Take 1 capsule by mouth daily.        . clidinium-chlordiazepoxide (LIBRAX) 2.5-5 MG per capsule Take 1 capsule by mouth 2 (two) times daily. As needed.       . cyclobenzaprine (FLEXERIL) 10 MG tablet Take 1 tablet (10 mg total) by mouth every 8 (eight) hours as needed for muscle spasms.  30  tablet  1  . fluticasone (FLONASE) 50 MCG/ACT nasal spray       . levocetirizine (XYZAL) 5 MG tablet TAKE 1 TABLET BY MOUTH EVERY DAY  90 tablet  1  . norethindrone-ethinyl estradiol (JUNEL FE 1/20) 1-20 MG-MCG per tablet Take 1 tablet by mouth daily.        Marland Kitchen omeprazole-sodium bicarbonate (ZEGERID) 40-1100 MG per capsule Take 1 capsule by mouth daily.        . ranitidine (ZANTAC) 300 MG tablet Take 1 tablet (300 mg total) by mouth at bedtime.  30 tablet  1  . rosuvastatin (CRESTOR) 20 MG tablet Take 1 tablet (20 mg total) by mouth at bedtime.  30 tablet  1  . sertraline (ZOLOFT) 50 MG tablet Take 100 mg by mouth daily.        . SUMAtriptan (IMITREX) 25 MG tablet       . Multiple Vitamin (MULTIVITAMIN) tablet Take 1 tablet by mouth daily.          Allergies  Allergen Reactions  . Venlafaxine     REACTION: bad dreams    Review of Systems  Review of Systems  Constitutional: Negative for fever and malaise/fatigue.  HENT: Negative for congestion.   Eyes: Negative for discharge.  Respiratory: Negative for shortness of breath.   Cardiovascular: Negative for chest pain, palpitations and leg swelling.  Gastrointestinal: Negative for nausea, abdominal pain and diarrhea.  Genitourinary: Negative for dysuria.  Musculoskeletal: Positive for joint pain. Negative for falls.       Knee pain  Skin: Negative for rash.  Neurological: Positive for headaches. Negative for loss of consciousness.  Endo/Heme/Allergies: Negative for polydipsia.  Psychiatric/Behavioral: Negative for depression and suicidal ideas. The patient is not nervous/anxious and does not have insomnia.     Objective  BP 128/76  Pulse 81  Temp(Src) 98.2 F (36.8 C) (Oral)  Ht 5' 11.5" (1.816 m)  Wt 254 lb 1.9 oz (115.268 kg)  BMI 34.95 kg/m2  SpO2 94%  LMP 03/04/2011  Physical Exam  Physical Exam  Constitutional: She is oriented to person, place, and time and well-developed, well-nourished, and in no distress. No  distress.  HENT:  Head: Normocephalic and atraumatic.  Eyes: Conjunctivae are normal.  Neck: Neck supple. No thyromegaly present.  Cardiovascular: Normal rate, regular rhythm and normal heart sounds.   No murmur heard. Pulmonary/Chest: Effort normal and breath sounds normal. She has no wheezes.  Abdominal: She exhibits no distension and no mass.  Musculoskeletal: She exhibits no edema.  Lymphadenopathy:    She has no cervical adenopathy.  Neurological: She  is alert and oriented to person, place, and time.  Skin: Skin is warm and dry. No rash noted. She is not diaphoretic.  Psychiatric: Memory, affect and judgment normal.    Lab Results  Component Value Date   TSH 0.76 02/22/2011   Lab Results  Component Value Date   WBC 9.5 02/22/2011   HGB 13.2 02/22/2011   HCT 40.0 02/22/2011   MCV 85.9 02/22/2011   PLT 301.0 02/22/2011   Lab Results  Component Value Date   CREATININE 1.0 02/22/2011   BUN 14 02/22/2011   NA 138 02/22/2011   K 3.9 02/22/2011   CL 105 02/22/2011   CO2 26 02/22/2011   Lab Results  Component Value Date   ALT 10 02/22/2011   AST 13 02/22/2011   ALKPHOS 108 02/22/2011   BILITOT 0.4 02/22/2011   Lab Results  Component Value Date   CHOL 283* 02/22/2011   Lab Results  Component Value Date   HDL 38.60* 02/22/2011   Lab Results  Component Value Date   LDLCALC 117* 03/31/2009   Lab Results  Component Value Date   TRIG 333.0* 02/22/2011   Lab Results  Component Value Date   CHOLHDL 7 02/22/2011     Assessment & Plan  OBESITY Has started to exercising more. Trying to watch her po intake.   Endometriosis Has severe dysmenorrhea and is being managed by Dr Edward Jolly, OB/GYN they recommended a hysterectomy and bladder a tack but she cannot proceed now due to work constraints, she will consider in 2013, for now she is encouraged to try taking Ibuprofen twice daily starting the day before her menses and for the first 2 days. If her stomach starts to act up she will stop  it. May use Vicodin prn for severe pain  DEPRESSION Patient continues to struggle with depression but is very nervous about altering meds. She has previously been encouraged to call [facility] for some counseling and that is encouraged again today. She agrees to proceed, she is also encouraged to add a daily walk or exercise to her regimen.  GERD The addition of Ranitidine has been helpful she is encouraged to continue avoiding offending foods.  Knee pain, right Encouraged ice and Aspercreme bid

## 2011-03-26 ENCOUNTER — Encounter: Payer: Self-pay | Admitting: Family Medicine

## 2011-04-03 ENCOUNTER — Other Ambulatory Visit: Payer: Self-pay

## 2011-04-03 MED ORDER — OMEPRAZOLE-SODIUM BICARBONATE 40-1100 MG PO CAPS: 1.0000 | ORAL_CAPSULE | Freq: Every day | ORAL | Status: DC

## 2011-04-03 NOTE — Telephone Encounter (Signed)
Patient left a message stating she would like the Omeprazole sent to [facility] and a 30 day supply to [facility] in [place]

## 2011-04-15 ENCOUNTER — Ambulatory Visit (INDEPENDENT_AMBULATORY_CARE_PROVIDER_SITE_OTHER): Payer: Private Health Insurance - Indemnity | Admitting: Family Medicine

## 2011-04-15 ENCOUNTER — Encounter: Payer: Self-pay | Admitting: Family Medicine

## 2011-04-15 VITALS — BP 128/81 | HR 77 | Temp 98.3°F | Ht 71.5 in | Wt 260.0 lb

## 2011-04-15 DIAGNOSIS — M542 Cervicalgia: Secondary | ICD-10-CM

## 2011-04-15 MED ORDER — CARISOPRODOL 350 MG PO TABS: 350.0000 mg | ORAL_TABLET | Freq: Every evening | ORAL | Status: AC | PRN

## 2011-04-15 MED ORDER — MELOXICAM 15 MG PO TABS: 15.0000 mg | ORAL_TABLET | Freq: Every day | ORAL | Status: DC | PRN

## 2011-04-15 NOTE — Patient Instructions (Signed)
Back Pain, Adult Low back pain is very common. About 1 in 5 people have back pain.The cause of low back pain is rarely dangerous. The pain often gets better over time.About half of people with a sudden onset of back pain feel better in just 2 weeks. About 8 in 10 people feel better by 6 weeks.  CAUSES Some common causes of back pain include:  Strain of the muscles or ligaments supporting the spine.   Wear and tear (degeneration) of the spinal discs.   Arthritis.   Direct injury to the back.  DIAGNOSIS Most of the time, the direct cause of low back pain is not known.However, back pain can be treated effectively even when the exact cause of the pain is unknown.Answering your caregiver's questions about your overall health and symptoms is one of the most accurate ways to make sure the cause of your pain is not dangerous. If your caregiver needs more information, he or she may order lab work or imaging tests (X-rays or MRIs).However, even if imaging tests show changes in your back, this usually does not require surgery. HOME CARE INSTRUCTIONS For many people, back pain returns.Since low back pain is rarely dangerous, it is often a condition that people can learn to manageon their own.   Remain active. It is stressful on the back to sit or stand in one place. Do not sit, drive, or stand in one place for more than 30 minutes at a time. Take short walks on level surfaces as soon as pain allows.Try to increase the length of time you walk each day.   Do not stay in bed.Resting more than 1 or 2 days can delay your recovery.   Do not avoid exercise or work.Your body is made to move.It is not dangerous to be active, even though your back may hurt.Your back will likely heal faster if you return to being active before your pain is gone.   Pay attention to your body when you bend and lift. Many people have less discomfortwhen lifting if they bend their knees, keep the load close to their  bodies,and avoid twisting. Often, the most comfortable positions are those that put less stress on your recovering back.   Find a comfortable position to sleep. Use a firm mattress and lie on your side with your knees slightly bent. If you lie on your back, put a pillow under your knees.   Only take over-the-counter or prescription medicines as directed by your caregiver. Over-the-counter medicines to reduce pain and inflammation are often the most helpful.Your caregiver may prescribe muscle relaxant drugs.These medicines help dull your pain so you can more quickly return to your normal activities and healthy exercise.   Put ice on the injured area.   Put ice in a plastic bag.   Place a towel between your skin and the bag.   Leave the ice on for 15 to 20 minutes, 3 to 4 times a day for the first 2 to 3 days. After that, ice and heat may be alternated to reduce pain and spasms.   Ask your caregiver about trying back exercises and gentle massage. This may be of some benefit.   Avoid feeling anxious or stressed.Stress increases muscle tension and can worsen back pain.It is important to recognize when you are anxious or stressed and learn ways to manage it.Exercise is a great option.  SEEK MEDICAL CARE IF:  You have pain that is not relieved with rest or medicine.   You have   pain that does not improve in 1 week.   You have new symptoms.   You are generally not feeling well.  SEEK IMMEDIATE MEDICAL CARE IF:   You have pain that radiates from your back into your legs.   You develop new bowel or bladder control problems.   You have unusual weakness or numbness in your arms or legs.   You develop nausea or vomiting.   You develop abdominal pain.   You feel faint.  Document Released: 06/10/2005 Document Revised: 02/20/2011 Document Reviewed: 10/29/2010 [employer] Patient Information 2012 [employer]. 

## 2011-04-16 ENCOUNTER — Encounter: Payer: Self-pay | Admitting: Family Medicine

## 2011-04-16 DIAGNOSIS — M542 Cervicalgia: Secondary | ICD-10-CM | POA: Insufficient documentation

## 2011-04-16 NOTE — Progress Notes (Signed)
Mallory [name] [id number] 1965-08-27 04/16/2011      Progress Note-Follow Up  Subjective  Chief Complaint  Chief Complaint  Patient presents with  . Shoulder Pain    X 2 weeks left arm, back of neck    HPI  In today with a one to two-week history of pain starting at the base of her skull on the left-hand side centralized around her ear and in reading down the side of her neck and into the top of her shoulder. She does get some mild discomfort in the upper arm. No radicular symptoms such as weakness or numbness. No acute injuries noted. The pain is unrelieved by position changes. She has tried an occasional dose of ibuprofen and cyclobenzaprine with minimal relief. Has not sought chiropractic care. Denies any fevers, congestion, chest pain, palpitations, shortness of breath, GI or GU complaints. No headaches. 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  Past Medical History  Diagnosis Date  . Anxiety   . Depression     no counseling  . GERD (gastroesophageal reflux disease)   . Allergy     year round  . Hyperlipidemia 2002  . IBS (irritable bowel syndrome) 2008    improved over last couple of years using Align  . Interstitial cystitis 2006    Dr Marcine Matar  . Pulmonary nodule 3/24/20101    Incidental finding of CT Chest ordered for chest pain  . Personal history of kidney stones 1986  . Asthma     mild, intermittent, allergy triggered  . Obesity   . Fibromyalgia 1997    dx by rheumatology, no recent flares  . Hiatal hernia   . Nasal lesion 10/24/2010  . PULMONARY NODULE 02/10/2009  . OBESITY 02/10/2009  . NECK PAIN 12/15/2009  . HYPERLIPIDEMIA 02/10/2009  . GERD 02/10/2009  . DEPRESSION 02/10/2009  . Chronic laryngitis 04/26/2009  . ANXIETY 02/10/2009  . ALLERGIC RHINITIS 02/10/2009  . Rash 10/24/2010  . Knee pain, right 12/01/2010  . Pedal edema 02/11/2011  . RUQ pain 02/22/2011  . Endometriosis 03/20/2011    Past Surgical History  Procedure Date  . Kidney  stone surgery 1986  . Dilation and curettage of uterus 2002  . Tonsillectomy   . Polypectomy     uterine    Family History  Problem Relation Age of Onset  . Cancer Mother     breast/ ovarian  . Diabetes Mother   . Hyperlipidemia Mother   . Hypertension Mother   . Cancer Maternal Aunt     colon  . Arthritis Paternal Grandmother   . Heart disease Paternal Grandmother   . Stroke Father 62  . Migraines Sister   . Cancer Maternal Grandmother   . Cancer Maternal Grandfather     History   Social History  . Marital Status: Married    Spouse Name: N/A    Number of Children: 2  . Years of Education: N/A   Occupational History  . unemployed    Social History Main Topics  . Smoking status: Former Games developer  . Smokeless tobacco: Never Used   Comment: when a teenager  . Alcohol Use: No  . Drug Use: No  . Sexually Active: Yes -- Female partner(s)   Other Topics Concern  . Not on file   Social History Narrative  . No narrative on file    Current Outpatient Prescriptions on File Prior to Visit  Medication Sig Dispense Refill  . albuterol (PROAIR HFA) 108 (90  BASE) MCG/ACT inhaler Inhale 1 -2 puffs by mouth every 4-6 hours as needed.       . ALPRAZolam (XANAX) 0.5 MG tablet Take 1 tablet (0.5 mg total) by mouth 2 (two) times daily as needed.  60 tablet  1  . azelastine (OPTIVAR) 0.05 % ophthalmic solution       . bifidobacterium infantis (ALIGN) capsule Take 1 capsule by mouth daily.        . clidinium-chlordiazepoxide (LIBRAX) 2.5-5 MG per capsule Take 1 capsule by mouth 2 (two) times daily. As needed.       . fluticasone (FLONASE) 50 MCG/ACT nasal spray       . HYDROcodone-acetaminophen (VICODIN) 5-500 MG per tablet Take 1 tablet by mouth every 6 (six) hours as needed. As needed for menstrual cycle       . levocetirizine (XYZAL) 5 MG tablet TAKE 1 TABLET BY MOUTH EVERY DAY  90 tablet  1  . Multiple Vitamin (MULTIVITAMIN) tablet Take 1 tablet by mouth daily.        .  norethindrone-ethinyl estradiol (JUNEL FE 1/20) 1-20 MG-MCG per tablet Take 1 tablet by mouth daily.        Marland Kitchen omeprazole-sodium bicarbonate (ZEGERID) 40-1100 MG per capsule Take 1 capsule by mouth daily.  30 capsule  0  . omeprazole-sodium bicarbonate (ZEGERID) 40-1100 MG per capsule Take 1 capsule by mouth daily.  90 capsule  1  . ranitidine (ZANTAC) 300 MG tablet Take 1 tablet (300 mg total) by mouth at bedtime.  30 tablet  1  . rosuvastatin (CRESTOR) 20 MG tablet Take 1 tablet (20 mg total) by mouth at bedtime.  30 tablet  1  . sertraline (ZOLOFT) 50 MG tablet Take 100 mg by mouth daily.        . SUMAtriptan (IMITREX) 25 MG tablet         Allergies  Allergen Reactions  . Venlafaxine     REACTION: bad dreams    Review of Systems  Review of Systems  Constitutional: Negative for fever and malaise/fatigue.  HENT: Positive for neck pain. Negative for congestion.   Eyes: Negative for discharge.  Respiratory: Negative for shortness of breath.   Cardiovascular: Negative for chest pain, palpitations and leg swelling.  Gastrointestinal: Negative for nausea, abdominal pain and diarrhea.  Genitourinary: Negative for dysuria.  Musculoskeletal: Negative for falls.       Left side  Skin: Negative for rash.  Neurological: Negative for loss of consciousness and headaches.  Endo/Heme/Allergies: Negative for polydipsia.  Psychiatric/Behavioral: Negative for depression and suicidal ideas. The patient is not nervous/anxious and does not have insomnia.     Objective  BP 128/81  Pulse 77  Temp(Src) 98.3 F (36.8 C) (Oral)  Ht 5' 11.5" (1.816 m)  Wt 260 lb (117.935 kg)  BMI 35.76 kg/m2  SpO2 97%  LMP 03/04/2011  Physical Exam  Physical Exam  Constitutional: She is oriented to person, place, and time and well-developed, well-nourished, and in no distress. No distress.  HENT:  Head: Normocephalic and atraumatic.  Eyes: Conjunctivae are normal.  Neck: Neck supple. No thyromegaly present.    Cardiovascular: Normal rate, regular rhythm and normal heart sounds.   No murmur heard. Pulmonary/Chest: Effort normal and breath sounds normal. She has no wheezes.  Abdominal: She exhibits no distension and no mass.  Musculoskeletal: Normal range of motion. She exhibits tenderness. She exhibits no edema.       Tender with palp over left SCM muscle. Spasm noted  Lymphadenopathy:  She has no cervical adenopathy.  Neurological: She is alert and oriented to person, place, and time.  Skin: Skin is warm and dry. No rash noted. She is not diaphoretic.  Psychiatric: Memory, affect and judgment normal.    Lab Results  Component Value Date   TSH 0.76 02/22/2011   Lab Results  Component Value Date   WBC 9.5 02/22/2011   HGB 13.2 02/22/2011   HCT 40.0 02/22/2011   MCV 85.9 02/22/2011   PLT 301.0 02/22/2011   Lab Results  Component Value Date   CREATININE 1.0 02/22/2011   BUN 14 02/22/2011   NA 138 02/22/2011   K 3.9 02/22/2011   CL 105 02/22/2011   CO2 26 02/22/2011   Lab Results  Component Value Date   ALT 10 02/22/2011   AST 13 02/22/2011   ALKPHOS 108 02/22/2011   BILITOT 0.4 02/22/2011   Lab Results  Component Value Date   CHOL 283* 02/22/2011   Lab Results  Component Value Date   HDL 38.60* 02/22/2011   Lab Results  Component Value Date   LDLCALC 117* 03/31/2009   Lab Results  Component Value Date   TRIG 333.0* 02/22/2011   Lab Results  Component Value Date   CHOLHDL 7 02/22/2011     Assessment & Plan  NECK PAIN Pain on left side of neck x 1 week. No acute injury but pain along entire Sternocleidomastoid muscle. Encouraged moist heat and stretching. Rx for Meloxicam 15 mg po in am with food and Carisoprodol 350 mg po qhs, notify us if no improvement

## 2011-04-16 NOTE — Assessment & Plan Note (Signed)
Pain on left side of neck x 1 week. No acute injury but pain along entire Sternocleidomastoid muscle. Encouraged moist heat and stretching. Rx for Meloxicam 15 mg po in am with food and Carisoprodol 350 mg po qhs, notify us if no improvement

## 2011-04-19 ENCOUNTER — Ambulatory Visit: Payer: Private Health Insurance - Indemnity | Admitting: Family Medicine

## 2011-04-22 ENCOUNTER — Telehealth: Payer: Self-pay

## 2011-04-22 NOTE — Telephone Encounter (Signed)
Yes she can take Imitrex with her antidepressant

## 2011-04-22 NOTE — Telephone Encounter (Signed)
Patient states she had a headache over the weekend (back of neck) Pt states a previous doctor gave her Imitrex to try but never did? Pt would like to know if this is okay to take with SSRI's?

## 2011-04-22 NOTE — Telephone Encounter (Signed)
Pt has a weeks worth of Imitrex.

## 2011-04-23 NOTE — Telephone Encounter (Signed)
Patient informed. I also asked patient to call us back and let us know if it helps or not

## 2011-04-24 ENCOUNTER — Telehealth: Payer: Self-pay

## 2011-04-24 NOTE — Telephone Encounter (Signed)
Pt left a message stating she didn't know if MD wanted to see pt or refer her somewhere? Pt states she is having a headache 24/7? Please advise?

## 2011-04-25 NOTE — Telephone Encounter (Signed)
Left a message for patient to return call.

## 2011-04-25 NOTE — Telephone Encounter (Signed)
Pt is coming in the morning 04-26-11

## 2011-04-25 NOTE — Telephone Encounter (Signed)
So probably we should do both. She will not get into Neurology in the next 2 days so she should come in here to get her vitals checked and let us give pain meds and then we will refer her

## 2011-04-26 ENCOUNTER — Encounter: Payer: Self-pay | Admitting: Family Medicine

## 2011-04-26 ENCOUNTER — Telehealth: Payer: Self-pay

## 2011-04-26 ENCOUNTER — Ambulatory Visit (INDEPENDENT_AMBULATORY_CARE_PROVIDER_SITE_OTHER): Payer: Private Health Insurance - Indemnity | Admitting: Family Medicine

## 2011-04-26 ENCOUNTER — Ambulatory Visit (HOSPITAL_BASED_OUTPATIENT_CLINIC_OR_DEPARTMENT_OTHER)
Admission: RE | Admit: 2011-04-26 | Discharge: 2011-04-26 | Disposition: A | Discharge status: Home / Self Care | Payer: Private Health Insurance - Indemnity | Source: Ambulatory Visit | Attending: Family Medicine | Admitting: Family Medicine

## 2011-04-26 DIAGNOSIS — M797 Fibromyalgia: Secondary | ICD-10-CM

## 2011-04-26 DIAGNOSIS — IMO0001 Reserved for inherently not codable concepts without codable children: Secondary | ICD-10-CM

## 2011-04-26 DIAGNOSIS — F411 Generalized anxiety disorder: Secondary | ICD-10-CM

## 2011-04-26 DIAGNOSIS — M542 Cervicalgia: Secondary | ICD-10-CM

## 2011-04-26 DIAGNOSIS — R51 Headache: Secondary | ICD-10-CM

## 2011-04-26 MED ORDER — GABAPENTIN 100 MG PO CAPS: 100.0000 mg | ORAL_CAPSULE | Freq: Every day | ORAL | Status: DC

## 2011-04-26 MED ORDER — BUTALBITAL-APAP-CAFFEINE 50-300-40 MG PO CAPS: 1.0000 | ORAL_CAPSULE | Freq: Four times a day (QID) | ORAL | Status: DC | PRN

## 2011-04-26 MED ORDER — BUTALBITAL-ACETAMINOPHEN 50-650 MG PO TABS: 1.0000 | ORAL_TABLET | Freq: Three times a day (TID) | ORAL | Status: DC | PRN

## 2011-04-26 NOTE — Telephone Encounter (Signed)
Pt states the Acetaminophen is on backorder? Can something else be called in?

## 2011-04-26 NOTE — Patient Instructions (Signed)

## 2011-04-29 ENCOUNTER — Encounter: Payer: Self-pay | Admitting: Neurology

## 2011-04-29 ENCOUNTER — Telehealth: Payer: Self-pay

## 2011-04-29 NOTE — Telephone Encounter (Signed)
It is up to her. If she wants to hold on the referral for now she can but if the HA comes back we will have to refer again. Can try acupuncture and/or chiropractic at her discretion

## 2011-04-29 NOTE — Telephone Encounter (Signed)
Patient left a message that her headache has been gone since yesterday (04-28-11). Pt is wandering if she should continue to go to the Neurology appt or just go to a chiropractor or acupuncture? Please advise? [phone]

## 2011-04-30 ENCOUNTER — Other Ambulatory Visit: Payer: Self-pay | Admitting: Family Medicine

## 2011-04-30 ENCOUNTER — Encounter: Payer: Self-pay | Admitting: Family Medicine

## 2011-04-30 NOTE — Progress Notes (Signed)
Mallory [name] [id number] 08-27-1965 04/30/2011      Progress Note-Follow Up  Subjective  Chief Complaint  Chief Complaint  Patient presents with  . Headache    follow up    HPI  Patient is a 45 yo caucasian female in today with concerns about persistent HA. She has been struggling with some neck pain and we have been treating that conservatively with meds but now she is noting worsening generalized HA. She has a long history of fibromyalgia and is noting worsening pain and fatigue. She is still struggling with allergies and congestion a bit as well. She does think her meds control those symptoms most of the time. No fevers, chills, CP, palp, SOB, GI or GU c/o. Still struggling with depression and anxiety but does not think that those symptoms have worsened  Past Medical History  Diagnosis Date  . Anxiety   . Depression     no counseling  . GERD (gastroesophageal reflux disease)   . Allergy     year round  . Hyperlipidemia 2002  . IBS (irritable bowel syndrome) 2008    improved over last couple of years using Align  . Interstitial cystitis 2006    Dr Marcine Matar  . Pulmonary nodule 3/24/20101    Incidental finding of CT Chest ordered for chest pain  . Personal history of kidney stones 1986  . Asthma     mild, intermittent, allergy triggered  . Obesity   . Fibromyalgia 1997    dx by rheumatology, no recent flares  . Hiatal hernia   . Nasal lesion 10/24/2010  . PULMONARY NODULE 02/10/2009  . OBESITY 02/10/2009  . NECK PAIN 12/15/2009  . HYPERLIPIDEMIA 02/10/2009  . GERD 02/10/2009  . DEPRESSION 02/10/2009  . Chronic laryngitis 04/26/2009  . ANXIETY 02/10/2009  . ALLERGIC RHINITIS 02/10/2009  . Rash 10/24/2010  . Knee pain, right 12/01/2010  . Pedal edema 02/11/2011  . RUQ pain 02/22/2011  . Endometriosis 03/20/2011  . Headache 04/30/2011    Past Surgical History  Procedure Date  . Kidney stone surgery 1986  . Dilation and curettage of uterus 2002  . Tonsillectomy   .  Polypectomy     uterine    Family History  Problem Relation Age of Onset  . Cancer Mother     breast/ ovarian  . Diabetes Mother   . Hyperlipidemia Mother   . Hypertension Mother   . Cancer Maternal Aunt     colon  . Arthritis Paternal Grandmother   . Heart disease Paternal Grandmother   . Stroke Father 93  . Migraines Sister   . Cancer Maternal Grandmother   . Cancer Maternal Grandfather     History   Social History  . Marital Status: Married    Spouse Name: N/A    Number of Children: 2  . Years of Education: N/A   Occupational History  . unemployed    Social History Main Topics  . Smoking status: Former Games developer  . Smokeless tobacco: Never Used   Comment: when a teenager  . Alcohol Use: No  . Drug Use: No  . Sexually Active: Yes -- Female partner(s)   Other Topics Concern  . Not on file   Social History Narrative  . No narrative on file    Current Outpatient Prescriptions on File Prior to Visit  Medication Sig Dispense Refill  . albuterol (PROAIR HFA) 108 (90 BASE) MCG/ACT inhaler Inhale 1 -2 puffs by mouth every 4-6 hours as needed.       Marland Kitchen  ALPRAZolam (XANAX) 0.5 MG tablet Take 1 tablet (0.5 mg total) by mouth 2 (two) times daily as needed.  60 tablet  1  . azelastine (OPTIVAR) 0.05 % ophthalmic solution       . bifidobacterium infantis (ALIGN) capsule Take 1 capsule by mouth daily.        . clidinium-chlordiazepoxide (LIBRAX) 2.5-5 MG per capsule Take 1 capsule by mouth 2 (two) times daily. As needed.       Marland Kitchen HYDROcodone-acetaminophen (VICODIN) 5-500 MG per tablet Take 1 tablet by mouth every 6 (six) hours as needed. As needed for menstrual cycle       . levocetirizine (XYZAL) 5 MG tablet TAKE 1 TABLET BY MOUTH EVERY DAY  90 tablet  1  . meloxicam (MOBIC) 15 MG tablet Take 1 tablet (15 mg total) by mouth daily as needed. Prn pain with food  30 tablet  2  . Multiple Vitamin (MULTIVITAMIN) tablet Take 1 tablet by mouth daily.        . norethindrone-ethinyl  estradiol (JUNEL FE 1/20) 1-20 MG-MCG per tablet Take 1 tablet by mouth daily.        Marland Kitchen omeprazole-sodium bicarbonate (ZEGERID) 40-1100 MG per capsule Take 1 capsule by mouth daily.  30 capsule  0  . omeprazole-sodium bicarbonate (ZEGERID) 40-1100 MG per capsule Take 1 capsule by mouth daily.  90 capsule  1  . ranitidine (ZANTAC) 300 MG tablet Take 1 tablet (300 mg total) by mouth at bedtime.  30 tablet  1  . rosuvastatin (CRESTOR) 20 MG tablet Take 1 tablet (20 mg total) by mouth at bedtime.  30 tablet  1  . sertraline (ZOLOFT) 50 MG tablet Take 100 mg by mouth daily.        . SUMAtriptan (IMITREX) 25 MG tablet         Allergies  Allergen Reactions  . Venlafaxine     REACTION: bad dreams    Review of Systems  Review of Systems  Constitutional: Negative for fever and malaise/fatigue.  HENT: Positive for neck pain. Negative for congestion.   Eyes: Negative for discharge.  Respiratory: Negative for shortness of breath.   Cardiovascular: Negative for chest pain, palpitations and leg swelling.  Gastrointestinal: Negative for nausea, abdominal pain and diarrhea.  Genitourinary: Negative for dysuria.  Musculoskeletal: Positive for myalgias and joint pain. Negative for falls.       Left arm pain and tingling in 3rd and 4th digit tingling in   Skin: Negative for rash.  Neurological: Positive for headaches. Negative for loss of consciousness.  Endo/Heme/Allergies: Negative for polydipsia.  Psychiatric/Behavioral: Negative for depression and suicidal ideas. The patient is not nervous/anxious and does not have insomnia.     Objective  BP 134/83  Pulse 83  Temp(Src) 97.6 F (36.4 C) (Oral)  Ht 5' 11.5" (1.816 m)  Wt 261 lb (118.389 kg)  BMI 35.89 kg/m2  SpO2 96%  LMP 04/23/2011  Physical Exam  Physical Exam  Constitutional: She is oriented to person, place, and time. She appears well-developed and well-nourished. No distress.  HENT:  Head: Normocephalic and atraumatic.  Right  Ear: External ear normal.  Left Ear: External ear normal.  Mouth/Throat: Oropharynx is clear and moist.  Eyes: Pupils are equal, round, and reactive to light. Right eye exhibits no discharge. Left eye exhibits no discharge.  Neck: Normal range of motion. Neck supple.  Cardiovascular: Normal rate, regular rhythm and normal heart sounds.   No murmur heard. Pulmonary/Chest: Effort normal and breath sounds normal.  No respiratory distress. She has no wheezes.  Abdominal: Soft. Bowel sounds are normal. She exhibits no mass. There is no tenderness.  Musculoskeletal: Normal range of motion. She exhibits no edema and no tenderness.  Neurological: She is alert and oriented to person, place, and time. She has normal reflexes. No cranial nerve deficit. Coordination normal.  Skin: Skin is warm and dry. She is not diaphoretic. No erythema.  Psychiatric: She has a normal mood and affect. Her behavior is normal. Thought content normal.    Lab Results  Component Value Date   TSH 0.76 02/22/2011   Lab Results  Component Value Date   WBC 9.5 02/22/2011   HGB 13.2 02/22/2011   HCT 40.0 02/22/2011   MCV 85.9 02/22/2011   PLT 301.0 02/22/2011   Lab Results  Component Value Date   CREATININE 1.0 02/22/2011   BUN 14 02/22/2011   NA 138 02/22/2011   K 3.9 02/22/2011   CL 105 02/22/2011   CO2 26 02/22/2011   Lab Results  Component Value Date   ALT 10 02/22/2011   AST 13 02/22/2011   ALKPHOS 108 02/22/2011   BILITOT 0.4 02/22/2011   Lab Results  Component Value Date   CHOL 283* 02/22/2011   Lab Results  Component Value Date   HDL 38.60* 02/22/2011   Lab Results  Component Value Date   LDLCALC 117* 03/31/2009   Lab Results  Component Value Date   TRIG 333.0* 02/22/2011   Lab Results  Component Value Date   CHOLHDL 7 02/22/2011     Assessment & Plan  Headache Patient is here today with persistent headache over past few weeks. She is encouraged to increase her rest, exercise, fluid intake. She is  asked to continue to work on her neck with moist heat, gentle stretching and she is given [place] to use prn for the HA. She is also referred to neurology for further evaluation.   ANXIETY Will increase her Sertraline to 100mg  daily and she may continue her Alprazolam prn

## 2011-04-30 NOTE — Assessment & Plan Note (Signed)
Patient is here today with persistent headache over past few weeks. She is encouraged to increase her rest, exercise, fluid intake. She is asked to continue to work on her neck with moist heat, gentle stretching and she is given [place] to use prn for the HA. She is also referred to neurology for further evaluation.

## 2011-04-30 NOTE — Telephone Encounter (Signed)
Patient informed. I recommended that she maybe keep her appt that way she doesn't have to wait if the headaches come back.

## 2011-04-30 NOTE — Assessment & Plan Note (Signed)
Will increase her Sertraline to 100mg  daily and she may continue her Alprazolam prn

## 2011-04-30 NOTE — Telephone Encounter (Signed)
Left a message for patient to return my call. 

## 2011-05-02 ENCOUNTER — Ambulatory Visit: Payer: Private Health Insurance - Indemnity | Admitting: Neurology

## 2011-05-03 ENCOUNTER — Other Ambulatory Visit: Payer: Self-pay

## 2011-05-03 MED ORDER — BUTALBITAL-APAP-CAFFEINE 50-325-40 MG PO TABS: 1.0000 | ORAL_TABLET | Freq: Three times a day (TID) | ORAL | Status: DC | PRN

## 2011-05-03 NOTE — Telephone Encounter (Signed)
Pt states Fioricet works well

## 2011-05-06 ENCOUNTER — Telehealth: Payer: Self-pay

## 2011-05-06 MED ORDER — FLUTICASONE PROPIONATE 50 MCG/ACT NA SUSP: 2.0000 | Freq: Every day | NASAL | Status: DC

## 2011-05-06 NOTE — Telephone Encounter (Signed)
Pt is calling stating she cancelled her neurologist appt and states it feels more muscular? Pt is wanting md's advise on going to neurology or physical therapy instead of chiropractor? Please advise?

## 2011-05-06 NOTE — Telephone Encounter (Signed)
Left a detailed message on patients voicemail.

## 2011-05-06 NOTE — Telephone Encounter (Signed)
I would have keep the neurosurgeon appt and if she wants to do some PT I would be happy to order

## 2011-05-06 NOTE — Telephone Encounter (Signed)
Opened on accident

## 2011-05-13 ENCOUNTER — Other Ambulatory Visit: Payer: Self-pay | Admitting: Family Medicine

## 2011-05-20 ENCOUNTER — Ambulatory Visit: Payer: Private Health Insurance - Indemnity | Admitting: Family Medicine

## 2011-06-05 ENCOUNTER — Ambulatory Visit: Payer: Private Health Insurance - Indemnity | Admitting: Neurology

## 2011-06-28 ENCOUNTER — Telehealth: Payer: Self-pay | Admitting: Family Medicine

## 2011-07-01 ENCOUNTER — Other Ambulatory Visit: Payer: Self-pay

## 2011-07-01 MED ORDER — ALPRAZOLAM 0.5 MG PO TABS: 0.5000 mg | ORAL_TABLET | Freq: Two times a day (BID) | ORAL | Status: DC | PRN

## 2011-07-01 NOTE — Telephone Encounter (Signed)
RX faxed to pharmacy.

## 2011-07-01 NOTE — Telephone Encounter (Signed)
Rx sent to pharmacy   

## 2011-07-01 NOTE — Telephone Encounter (Signed)
Please advise refill? 

## 2011-07-23 ENCOUNTER — Other Ambulatory Visit: Payer: Self-pay

## 2011-07-23 MED ORDER — OMEPRAZOLE-SODIUM BICARBONATE 40-1100 MG PO CAPS: 1.0000 | ORAL_CAPSULE | Freq: Every day | ORAL | Status: DC

## 2011-07-29 ENCOUNTER — Other Ambulatory Visit: Payer: Self-pay

## 2011-07-29 MED ORDER — FLUTICASONE PROPIONATE 50 MCG/ACT NA SUSP: 2.0000 | Freq: Every day | NASAL | Status: DC

## 2011-07-29 MED ORDER — SERTRALINE HCL 50 MG PO TABS: 100.0000 mg | ORAL_TABLET | Freq: Every day | ORAL | Status: DC

## 2011-07-31 ENCOUNTER — Other Ambulatory Visit: Payer: Self-pay | Admitting: Family Medicine

## 2011-08-06 ENCOUNTER — Ambulatory Visit (INDEPENDENT_AMBULATORY_CARE_PROVIDER_SITE_OTHER): Payer: Private Health Insurance - Indemnity | Admitting: Family Medicine

## 2011-08-06 ENCOUNTER — Other Ambulatory Visit: Payer: Self-pay | Admitting: Family Medicine

## 2011-08-06 ENCOUNTER — Encounter: Payer: Self-pay | Admitting: Family Medicine

## 2011-08-06 ENCOUNTER — Telehealth: Payer: Self-pay

## 2011-08-06 VITALS — BP 132/80 | HR 92 | Temp 97.8°F | Ht 71.5 in | Wt 259.8 lb

## 2011-08-06 DIAGNOSIS — F341 Dysthymic disorder: Secondary | ICD-10-CM

## 2011-08-06 DIAGNOSIS — F418 Other specified anxiety disorders: Secondary | ICD-10-CM

## 2011-08-06 DIAGNOSIS — M79609 Pain in unspecified limb: Secondary | ICD-10-CM

## 2011-08-06 DIAGNOSIS — R030 Elevated blood-pressure reading, without diagnosis of hypertension: Secondary | ICD-10-CM

## 2011-08-06 DIAGNOSIS — IMO0001 Reserved for inherently not codable concepts without codable children: Secondary | ICD-10-CM

## 2011-08-06 DIAGNOSIS — M79602 Pain in left arm: Secondary | ICD-10-CM

## 2011-08-06 DIAGNOSIS — E785 Hyperlipidemia, unspecified: Secondary | ICD-10-CM

## 2011-08-06 DIAGNOSIS — Z79899 Other long term (current) drug therapy: Secondary | ICD-10-CM

## 2011-08-06 DIAGNOSIS — R253 Fasciculation: Secondary | ICD-10-CM

## 2011-08-06 DIAGNOSIS — R0789 Other chest pain: Secondary | ICD-10-CM

## 2011-08-06 DIAGNOSIS — R259 Unspecified abnormal involuntary movements: Secondary | ICD-10-CM

## 2011-08-06 MED ORDER — ASPIRIN 81 MG PO TBEC: 81.0000 mg | DELAYED_RELEASE_TABLET | Freq: Every day | ORAL | Status: DC

## 2011-08-06 MED ORDER — SERTRALINE HCL 100 MG PO TABS: 100.0000 mg | ORAL_TABLET | Freq: Every day | ORAL | Status: DC

## 2011-08-06 MED ORDER — FLAXSEED OIL 1000 MG PO CAPS: 2.0000 | ORAL_CAPSULE | Freq: Every day | ORAL | Status: DC

## 2011-08-06 NOTE — Patient Instructions (Signed)
Hypertriglyceridemia  Diet for High blood levels of Triglycerides Most fats in food are triglycerides. Triglycerides in your blood are stored as fat in your body. High levels of triglycerides in your blood may put you at a greater risk for heart disease and stroke.  Normal triglyceride levels are less than 150 mg/dL. Borderline high levels are 150-199 mg/dl. High levels are 200 - 499 mg/dL, and very high triglyceride levels are greater than 500 mg/dL. The decision to treat high triglycerides is generally based on the level. For people with borderline or high triglyceride levels, treatment includes weight loss and exercise. Drugs are recommended for people with very high triglyceride levels. Many people who need treatment for high triglyceride levels have metabolic syndrome. This syndrome is a collection of disorders that often include: insulin resistance, high blood pressure, blood clotting problems, high cholesterol and triglycerides. TESTING PROCEDURE FOR TRIGLYCERIDES  You should not eat 4 hours before getting your triglycerides measured. The normal range of triglycerides is between 10 and 250 milligrams per deciliter (mg/dl). Some people may have extreme levels (1000 or above), but your triglyceride level may be too high if it is above 150 mg/dl, depending on what other risk factors you have for heart disease.   People with high blood triglycerides may also have high blood cholesterol levels. If you have high blood cholesterol as well as high blood triglycerides, your risk for heart disease is probably greater than if you only had high triglycerides. High blood cholesterol is one of the main risk factors for heart disease.  CHANGING YOUR DIET  Your weight can affect your blood triglyceride level. If you are more than 20% above your ideal body weight, you may be able to lower your blood triglycerides by losing weight. Eating less and exercising regularly is the best way to combat this. Fat provides  more calories than any other food. The best way to lose weight is to eat less fat. Only 30% of your total calories should come from fat. Less than 7% of your diet should come from saturated fat. A diet low in fat and saturated fat is the same as a diet to decrease blood cholesterol. By eating a diet lower in fat, you may lose weight, lower your blood cholesterol, and lower your blood triglyceride level.  Eating a diet low in fat, especially saturated fat, may also help you lower your blood triglyceride level. Ask your dietitian to help you figure how much fat you can eat based on the number of calories your caregiver has prescribed for you.  Exercise, in addition to helping with weight loss may also help lower triglyceride levels.   Alcohol can increase blood triglycerides. You may need to stop drinking alcoholic beverages.   Too much carbohydrate in your diet may also increase your blood triglycerides. Some complex carbohydrates are necessary in your diet. These may include bread, rice, potatoes, other starchy vegetables and cereals.   Reduce "simple" carbohydrates. These may include pure sugars, candy, honey, and jelly without losing other nutrients. If you have the kind of high blood triglycerides that is affected by the amount of carbohydrates in your diet, you will need to eat less sugar and less high-sugar foods. Your caregiver can help you with this.   Adding 2-4 grams of fish oil (EPA+ DHA) may also help lower triglycerides. Speak with your caregiver before adding any supplements to your regimen.  Following the Diet  Maintain your ideal weight. Your caregivers can help you with a diet. Generally,   eating less food and getting more exercise will help you lose weight. Joining a weight control group may also help. Ask your caregivers for a good weight control group in your area.  Eat low-fat foods instead of high-fat foods. This can help you lose weight too.  These foods are lower in fat. Eat MORE  of these:   Dried beans, peas, and lentils.   Egg whites.   Low-fat cottage cheese.   Fish.   Lean cuts of meat, such as round, sirloin, rump, and flank (cut extra fat off meat you fix).   Whole grain breads, cereals and pasta.   Skim and nonfat dry milk.   Low-fat yogurt.   Poultry without the skin.   Cheese made with skim or part-skim milk, such as mozzarella, parmesan, farmers', ricotta, or pot cheese.  These are higher fat foods. Eat LESS of these:   Whole milk and foods made from whole milk, such as American, blue, cheddar, monterey jack, and swiss cheese   High-fat meats, such as luncheon meats, sausages, knockwurst, bratwurst, hot dogs, ribs, corned beef, ground pork, and regular ground beef.   Fried foods.  Limit saturated fats in your diet. Substituting unsaturated fat for saturated fat may decrease your blood triglyceride level. You will need to read package labels to know which products contain saturated fats.  These foods are high in saturated fat. Eat LESS of these:   Fried pork skins.   Whole milk.   Skin and fat from poultry.   Palm oil.   Butter.   Shortening.   Cream cheese.   Bacon.   Margarines and baked goods made from listed oils.   Vegetable shortenings.   Chitterlings.   Fat from meats.   Coconut oil.   Palm kernel oil.   Lard.   Cream.   Sour cream.   Fatback.   Coffee whiteners and non-dairy creamers made with these oils.   Cheese made from whole milk.  Use unsaturated fats (both polyunsaturated and monounsaturated) moderately. Remember, even though unsaturated fats are better than saturated fats; you still want a diet low in total fat.  These foods are high in unsaturated fat:   Canola oil.   Sunflower oil.   Mayonnaise.   Almonds.   Peanuts.   Pine nuts.   Margarines made with these oils.   Safflower oil.   Olive oil.   Avocados.   Cashews.   Peanut butter.   Sunflower seeds.   Soybean oil.     Peanut oil.   Olives.   Pecans.   Walnuts.   Pumpkin seeds.  Avoid sugar and other high-sugar foods. This will decrease carbohydrates without decreasing other nutrients. Sugar in your food goes rapidly to your blood. When there is excess sugar in your blood, your liver may use it to make more triglycerides. Sugar also contains calories without other important nutrients.  Eat LESS of these:   Sugar, brown sugar, powdered sugar, jam, jelly, preserves, honey, syrup, molasses, pies, candy, cakes, cookies, frosting, pastries, colas, soft drinks, punches, fruit drinks, and regular gelatin.   Avoid alcohol. Alcohol, even more than sugar, may increase blood triglycerides. In addition, alcohol is high in calories and low in nutrients. Ask for sparkling water, or a diet soft drink instead of an alcoholic beverage.  Suggestions for planning and preparing meals   Bake, broil, grill or roast meats instead of frying.   Remove fat from meats and skin from poultry before cooking.   Add spices,   herbs, lemon juice or vinegar to vegetables instead of salt, rich sauces or gravies.   Use a non-stick skillet without fat or use no-stick sprays.   Cool and refrigerate stews and broth. Then remove the hardened fat floating on the surface before serving.   Refrigerate meat drippings and skim off fat to make low-fat gravies.   Serve more fish.   Use less butter, margarine and other high-fat spreads on bread or vegetables.   Use skim or reconstituted non-fat dry milk for cooking.   Cook with low-fat cheeses.   Substitute low-fat yogurt or cottage cheese for all or part of the sour cream in recipes for sauces, dips or congealed salads.   Use half yogurt/half mayonnaise in salad recipes.   Substitute evaporated skim milk for cream. Evaporated skim milk or reconstituted non-fat dry milk can be whipped and substituted for whipped cream in certain recipes.   Choose fresh fruits for dessert instead of  high-fat foods such as pies or cakes. Fruits are naturally low in fat.  When Dining Out   Order low-fat appetizers such as fruit or vegetable juice, pasta with vegetables or tomato sauce.   Select clear, rather than cream soups.   Ask that dressings and gravies be served on the side. Then use less of them.   Order foods that are baked, broiled, poached, steamed, stir-fried, or roasted.   Ask for margarine instead of butter, and use only a small amount.   Drink sparkling water, unsweetened tea or coffee, or diet soft drinks instead of alcohol or other sweet beverages.  QUESTIONS AND ANSWERS ABOUT OTHER FATS IN THE BLOOD: SATURATED FAT, TRANS FAT, AND CHOLESTEROL What is trans fat? Trans fat is a type of fat that is formed when vegetable oil is hardened through a process called hydrogenation. This process helps makes foods more solid, gives them shape, and prolongs their shelf life. Trans fats are also called hydrogenated or partially hydrogenated oils.  What do saturated fat, trans fat, and cholesterol in foods have to do with heart disease? Saturated fat, trans fat, and cholesterol in the diet all raise the level of LDL "bad" cholesterol in the blood. The higher the LDL cholesterol, the greater the risk for coronary heart disease (CHD). Saturated fat and trans fat raise LDL similarly.  What foods contain saturated fat, trans fat, and cholesterol? High amounts of saturated fat are found in animal products, such as fatty cuts of meat, chicken skin, and full-fat dairy products like butter, whole milk, cream, and cheese, and in tropical vegetable oils such as palm, palm kernel, and coconut oil. Trans fat is found in some of the same foods as saturated fat, such as vegetable shortening, some margarines (especially hard or stick margarine), crackers, cookies, baked goods, fried foods, salad dressings, and other processed foods made with partially hydrogenated vegetable oils. Small amounts of trans fat  also occur naturally in some animal products, such as milk products, beef, and lamb. Foods high in cholesterol include liver, other organ meats, egg yolks, shrimp, and full-fat dairy products. How can I use the new food label to make heart-healthy food choices? Check the Nutrition Facts panel of the food label. Choose foods lower in saturated fat, trans fat, and cholesterol. For saturated fat and cholesterol, you can also use the Percent Daily Value (%DV): 5% DV or less is low, and 20% DV or more is high. (There is no %DV for trans fat.) Use the Nutrition Facts panel to choose foods low in   saturated fat and cholesterol, and if the trans fat is not listed, read the ingredients and limit products that list shortening or hydrogenated or partially hydrogenated vegetable oil, which tend to be high in trans fat. POINTS TO REMEMBER: YOU NEED A LITTLE TLC (THERAPEUTIC LIFESTYLE CHANGES)  Discuss your risk for heart disease with your caregivers, and take steps to reduce risk factors.   Change your diet. Choose foods that are low in saturated fat, trans fat, and cholesterol.   Add exercise to your daily routine if it is not already being done. Participate in physical activity of moderate intensity, like brisk walking, for at least 30 minutes on most, and preferably all days of the week. No time? Break the 30 minutes into three, 10-minute segments during the day.   Stop smoking. If you do smoke, contact your caregiver to discuss ways in which they can help you quit.   Do not use street drugs.   Maintain a normal weight.   Maintain a healthy blood pressure.   Keep up with your blood work for checking the fats in your blood as directed by your caregiver.  Document Released: 03/28/2004 Document Revised: 02/20/2011 Document Reviewed: 10/24/2008 [employer] Patient Information 2012 [employer]. 

## 2011-08-06 NOTE — Telephone Encounter (Signed)
Opened on accident

## 2011-08-06 NOTE — Telephone Encounter (Signed)
Pt states she has been under a lot of stress lately and

## 2011-08-07 ENCOUNTER — Encounter: Payer: Self-pay | Admitting: Family Medicine

## 2011-08-07 LAB — PHOSPHORUS: Phosphorus: 3.6 mg/dL (ref 2.3–4.6)

## 2011-08-07 LAB — BASIC METABOLIC PANEL
CO2: 24 mEq/L (ref 19–32)
Chloride: 102 mEq/L (ref 96–112)
Potassium: 4 mEq/L (ref 3.5–5.3)
Sodium: 139 mEq/L (ref 135–145)

## 2011-08-07 LAB — CBC
Platelets: 311 10*3/uL (ref 150–400)
RBC: 4.95 MIL/uL (ref 3.87–5.11)
WBC: 11.4 10*3/uL — ABNORMAL HIGH (ref 4.0–10.5)

## 2011-08-07 LAB — LIPID PANEL
HDL: 42 mg/dL (ref >39)
LDL Cholesterol: 229 mg/dL — ABNORMAL HIGH (ref 0–99)
Total CHOL/HDL Ratio: 7.4 Ratio
Triglycerides: 193 mg/dL — ABNORMAL HIGH (ref <150)

## 2011-08-07 LAB — HEPATIC FUNCTION PANEL
AST: 15 U/L (ref 0–37)
Albumin: 4.1 g/dL (ref 3.5–5.2)
Total Bilirubin: 0.4 mg/dL (ref 0.3–1.2)
Total Protein: 7.1 g/dL (ref 6.0–8.3)

## 2011-08-07 MED ORDER — ASPIRIN 81 MG PO TBEC: 81.0000 mg | DELAYED_RELEASE_TABLET | Freq: Every day | ORAL | Status: DC

## 2011-08-07 NOTE — Progress Notes (Signed)
Patient ID: Mallory [name], female   DOB: [birth year], 46 y.o.   MRN: [id number] Mallory [name] [id number] Jan 15, 1966 08/07/2011      Progress Note-Follow Up  Subjective  Chief Complaint  Chief Complaint  Patient presents with  . feels like a heartbeat in left shoulder    X 4 days    HPI  Patient is a 46 year old Caucasian female who is in today complaining of a sensation of a heartbeat or trembling in her left upper shoulder for about 4 days. It comes and goes. She denies any injury or fall. She has a slight sense of disease in her chest as well but cannot be anymore specific than that. She denies any shortness of breath but she has been under a great deal of stress with her job. She does given notice and has a boss as being very mean to her period starts her new job in 2 weeks. As a result her eye this is increased she's having epigastric pain and abdominal issues. [name] also had a flare in her interstitial cystitis and is having some dysuria and burning. Feels overly anxious and worried and occasionally off balance and dizzy. Is having trouble focusing and is struggling with low mood and decreased energy. She denies shortness of breath, fevers, recent illness.  Past Medical History  Diagnosis Date  . Anxiety   . Depression     no counseling  . GERD (gastroesophageal reflux disease)   . Allergy     year round  . Hyperlipidemia 2002  . IBS (irritable bowel syndrome) 2008    improved over last couple of years using Align  . Interstitial cystitis 2006    Dr Marcine Matar  . Pulmonary nodule 3/24/20101    Incidental finding of CT Chest ordered for chest pain  . Personal history of kidney stones 1986  . Asthma     mild, intermittent, allergy triggered  . Obesity   . Fibromyalgia 1997    dx by rheumatology, no recent flares  . Hiatal hernia   . Nasal lesion 10/24/2010  . PULMONARY NODULE 02/10/2009  . OBESITY 02/10/2009  . NECK PAIN 12/15/2009  . HYPERLIPIDEMIA 02/10/2009  . GERD  02/10/2009  . DEPRESSION 02/10/2009  . Chronic laryngitis 04/26/2009  . ANXIETY 02/10/2009  . ALLERGIC RHINITIS 02/10/2009  . Rash 10/24/2010  . Knee pain, right 12/01/2010  . Pedal edema 02/11/2011  . RUQ pain 02/22/2011  . Endometriosis 03/20/2011  . Headache 04/30/2011  . Fasciculations of muscle 08/07/2011  . Elevated BP 08/07/2011    Past Surgical History  Procedure Date  . Kidney stone surgery 1986  . Dilation and curettage of uterus 2002  . Tonsillectomy   . Polypectomy     uterine    Family History  Problem Relation Age of Onset  . Cancer Mother     breast/ ovarian  . Diabetes Mother   . Hyperlipidemia Mother   . Hypertension Mother   . Cancer Maternal Aunt     colon  . Arthritis Paternal Grandmother   . Heart disease Paternal Grandmother   . Stroke Father 54  . Migraines Sister   . Cancer Maternal Grandmother   . Cancer Maternal Grandfather     History   Social History  . Marital Status: Married    Spouse Name: N/A    Number of Children: 2  . Years of Education: N/A   Occupational History  . unemployed    Social History Main Topics  . Smoking  status: Former Games developer  . Smokeless tobacco: Never Used   Comment: when a teenager  . Alcohol Use: No  . Drug Use: No  . Sexually Active: Yes -- Female partner(s)   Other Topics Concern  . Not on file   Social History Narrative  . No narrative on file    Current Outpatient Prescriptions on File Prior to Visit  Medication Sig Dispense Refill  . albuterol (PROAIR HFA) 108 (90 BASE) MCG/ACT inhaler Inhale 1 -2 puffs by mouth every 4-6 hours as needed.       . ALPRAZolam (XANAX) 0.5 MG tablet Take 1 tablet (0.5 mg total) by mouth 2 (two) times daily as needed.  60 tablet  1  . azelastine (OPTIVAR) 0.05 % ophthalmic solution       . bifidobacterium infantis (ALIGN) capsule Take 1 capsule by mouth daily.        . butalbital-acetaminophen-caffeine (FIORICET) 50-325-40 MG per tablet Take 1 tablet by mouth 3 (three) times  daily as needed. headache  40 tablet  0  . carisoprodol (SOMA) 350 MG tablet       . clidinium-chlordiazepoxide (LIBRAX) 2.5-5 MG per capsule Take 1 capsule by mouth 2 (two) times daily. As needed.       . fluticasone (FLONASE) 50 MCG/ACT nasal spray Place 2 sprays into the nose daily.  16 g  2  . gabapentin (NEURONTIN) 100 MG capsule Take 1 capsule (100 mg total) by mouth daily.  30 capsule  2  . HYDROcodone-acetaminophen (VICODIN) 5-500 MG per tablet Take 1 tablet by mouth every 6 (six) hours as needed. As needed for menstrual cycle       . levocetirizine (XYZAL) 5 MG tablet TAKE 1 TABLET BY MOUTH EVERY DAY  90 tablet  1  . meloxicam (MOBIC) 15 MG tablet Take 1 tablet (15 mg total) by mouth daily as needed. Prn pain with food  30 tablet  2  . Multiple Vitamin (MULTIVITAMIN) tablet Take 1 tablet by mouth daily.        . norethindrone-ethinyl estradiol (JUNEL FE 1/20) 1-20 MG-MCG per tablet Take 1 tablet by mouth daily.        Marland Kitchen omeprazole-sodium bicarbonate (ZEGERID) 40-1100 MG per capsule Take 1 capsule by mouth daily.  90 capsule  1  . omeprazole-sodium bicarbonate (ZEGERID) 40-1100 MG per capsule Take 1 capsule by mouth daily.  30 capsule  0  . ranitidine (ZANTAC) 300 MG tablet TAKE 1 TABLET AT BEDTIME  30 tablet  1  . SUMAtriptan (IMITREX) 25 MG tablet         Allergies  Allergen Reactions  . Venlafaxine     REACTION: bad dreams    Review of Systems  Review of Systems  Constitutional: Positive for malaise/fatigue. Negative for fever.  HENT: Negative for congestion.   Eyes: Negative for discharge.  Respiratory: Negative for shortness of breath.   Cardiovascular: Negative for chest pain, palpitations and leg swelling.  Gastrointestinal: Positive for abdominal pain. Negative for nausea and diarrhea.  Genitourinary: Positive for dysuria and frequency.  Musculoskeletal: Negative for falls.       Fasciculations left shoulder, intermittent  Skin: Negative for rash.  Neurological:  Negative for loss of consciousness and headaches.  Endo/Heme/Allergies: Negative for polydipsia.  Psychiatric/Behavioral: Positive for depression. Negative for suicidal ideas. The patient is nervous/anxious. The patient does not have insomnia.     Objective  BP 132/80  Pulse 92  Temp(Src) 97.8 F (36.6 C) (Temporal)  Ht 5' 11.5" (1.816  m)  Wt 259 lb 12.8 oz (117.845 kg)  BMI 35.73 kg/m2  SpO2 92%  LMP 07/23/2011  Physical Exam  Physical Exam  Constitutional: She is oriented to person, place, and time and well-developed, well-nourished, and in no distress. No distress.  HENT:  Head: Normocephalic and atraumatic.  Eyes: Conjunctivae are normal.  Neck: Neck supple. No thyromegaly present.  Cardiovascular: Normal rate, regular rhythm and normal heart sounds.   No murmur heard. Pulmonary/Chest: Effort normal and breath sounds normal. She has no wheezes.  Abdominal: She exhibits no distension and no mass.  Musculoskeletal: She exhibits no edema.  Lymphadenopathy:    She has no cervical adenopathy.  Neurological: She is alert and oriented to person, place, and time.  Skin: Skin is warm and dry. No rash noted. She is not diaphoretic.  Psychiatric: Memory, affect and judgment normal.    Lab Results  Component Value Date   TSH 1.375 08/06/2011   Lab Results  Component Value Date   WBC 11.4* 08/06/2011   HGB 13.8 08/06/2011   HCT 42.5 08/06/2011   MCV 85.9 08/06/2011   PLT 311 08/06/2011   Lab Results  Component Value Date   CREATININE 0.89 08/06/2011   BUN 13 08/06/2011   NA 139 08/06/2011   K 4.0 08/06/2011   CL 102 08/06/2011   CO2 24 08/06/2011   Lab Results  Component Value Date   ALT 18 08/06/2011   AST 15 08/06/2011   ALKPHOS 119* 08/06/2011   BILITOT 0.4 08/06/2011   Lab Results  Component Value Date   CHOL 310* 08/06/2011   Lab Results  Component Value Date   HDL 42 08/06/2011   Lab Results  Component Value Date   LDLCALC 229* 08/06/2011   Lab Results    Component Value Date   TRIG 193* 08/06/2011   Lab Results  Component Value Date   CHOLHDL 7.4 08/06/2011     Assessment & Plan  Depression with anxiety Patient has just given notice at a job with a very difficult boss. She finishes her last few days the stress is gone worse. Technologist depression anxiety or the source and she is about to start a new job. [name] only been taking her sertraline 50 mg agrees to increase to 100. May continue to use alprazolam when necessary  Elevated BP Improved with repeat today, avoid sodium and we will recheck next month  Fasciculations of muscle Left shoulder intermittently, palpated during visit. Encouraged increased hydration and consider a gatorade daily, notify us if symptoms worsen  HYPERLIPIDEMIA Very hi, patient stopped her statin, Crestor and she says she feels less achy and tired, she is encouraged to continue her fatty acid supplement, watch her diet and consider letting us start a new cholesterol med, she is given the choice of Lopid, Livalo or Welchol. She agrees to call back if she decides to start one  Atypical chest pain Patient describes a uneasy feeling in her chest, discomfort in her left arm and increasing, fatigue. She has some risk factors and is also very anxious. Will have her evaluated by cardiology to offer reassurance and she is to start an 81 mg aspirin daily

## 2011-08-07 NOTE — Assessment & Plan Note (Signed)
Patient has just given notice at a job with a very difficult boss. She finishes her last few days the stress is gone worse. Technologist depression anxiety or the source and she is about to start a new job. [name] only been taking her sertraline 50 mg agrees to increase to 100. May continue to use alprazolam when necessary

## 2011-08-07 NOTE — Assessment & Plan Note (Signed)
Left shoulder intermittently, palpated during visit. Encouraged increased hydration and consider a gatorade daily, notify us if symptoms worsen

## 2011-08-07 NOTE — Assessment & Plan Note (Signed)
Improved with repeat today, avoid sodium and we will recheck next month

## 2011-08-07 NOTE — Assessment & Plan Note (Signed)
Very hi, patient stopped her statin, Crestor and she says she feels less achy and tired, she is encouraged to continue her fatty acid supplement, watch her diet and consider letting us start a new cholesterol med, she is given the choice of Lopid, Livalo or Welchol. She agrees to call back if she decides to start one

## 2011-08-07 NOTE — Assessment & Plan Note (Signed)
Patient describes a uneasy feeling in her chest, discomfort in her left arm and increasing, fatigue. She has some risk factors and is also very anxious. Will have her evaluated by cardiology to offer reassurance and she is to start an 81 mg aspirin daily

## 2011-08-09 LAB — VITAMIN D 1,25 DIHYDROXY
Vitamin D 1, 25 (OH)2 Total: 29 pg/mL (ref 18–72)
Vitamin D3 1, 25 (OH)2: 29 pg/mL

## 2011-08-09 MED ORDER — ATORVASTATIN CALCIUM 10 MG PO TABS: 10.0000 mg | ORAL_TABLET | Freq: Every day | ORAL | Status: DC

## 2011-08-09 NOTE — Progress Notes (Signed)
Addended by: Luisa Dago on: 08/09/2011 08:54 AM   Modules accepted: Orders

## 2011-08-12 ENCOUNTER — Telehealth: Payer: Self-pay | Admitting: Family Medicine

## 2011-08-12 ENCOUNTER — Ambulatory Visit (INDEPENDENT_AMBULATORY_CARE_PROVIDER_SITE_OTHER): Payer: Private Health Insurance - Indemnity | Admitting: Cardiovascular Disease

## 2011-08-12 ENCOUNTER — Encounter: Payer: Self-pay | Admitting: Cardiovascular Disease

## 2011-08-12 VITALS — BP 122/78 | HR 72 | Ht 71.0 in | Wt 257.1 lb

## 2011-08-12 DIAGNOSIS — E785 Hyperlipidemia, unspecified: Secondary | ICD-10-CM

## 2011-08-12 DIAGNOSIS — R0789 Other chest pain: Secondary | ICD-10-CM

## 2011-08-12 DIAGNOSIS — R072 Precordial pain: Secondary | ICD-10-CM

## 2011-08-12 NOTE — Telephone Encounter (Signed)
Pt is taking 100 mg Zoloft.  Pt is feeling "spacy and more tired".  Did not feel comfortable driving.  Pt wants to know if this is normal and she needs to hang in there or should she decrease dose.   Pt has taken 50 mg since Friday and has been fine.  Would like to know if she needs something in addition to 50 mg Zoloft.   Also, would like to know if she should continue 2000 units of vitamin d3.  Please advise.

## 2011-08-12 NOTE — Progress Notes (Signed)
HPI:  This is a 46 year old woman presenting for initial cardiac evaluation. The patient has complained of recent episodes of chest pain. She feels a pressure-like sensation with exertion in her chest and upper back. This predominantly occurs when she is doing activities where she bends forward. She has not felt these symptoms with activity such as climbing stairs. The pain is described as a pressure across her chest and sometimes into the back. It does not radiate to the neck or jaw areas. She also complains of a feeling of her heartbeat in her left shoulder where this is an uncomfortable sensation that she has difficulty further characterizing it.  She has lightheadedness with emotional stress. She denies syncope. She has been noted to have marked hyperlipidemia but has been intolerant to statin drugs because of cognitive dysfunction with these. She feels as if there is a "fog over her." She recently has started low dose Lipitor and is tolerating this after a few doses.  Outpatient Encounter Prescriptions as of 08/12/2011  Medication Sig Dispense Refill  . albuterol (PROAIR HFA) 108 (90 BASE) MCG/ACT inhaler Inhale 1 -2 puffs by mouth every 4-6 hours as needed.       . ALPRAZolam (XANAX) 0.5 MG tablet Take 1 tablet (0.5 mg total) by mouth 2 (two) times daily as needed.  60 tablet  1  . aspirin (ASPIRIN EC) 81 MG EC tablet Take 1 tablet (81 mg total) by mouth daily. Swallow whole.  30 tablet  12  . atorvastatin (LIPITOR) 10 MG tablet Take 1 tablet (10 mg total) by mouth daily.  30 tablet  3  . bifidobacterium infantis (ALIGN) capsule Take 1 capsule by mouth daily.        . Cholecalciferol (VITAMIN D3) 2000 UNITS TABS Take 2,000 Units by mouth daily.      . clidinium-chlordiazepoxide (LIBRAX) 2.5-5 MG per capsule Take 1 capsule by mouth 2 (two) times daily. As needed.       . Flaxseed, Linseed, (FLAXSEED OIL) 1000 MG CAPS Take 2 capsules (2,000 mg total) by mouth daily.  60 capsule  0  . fluticasone  (FLONASE) 50 MCG/ACT nasal spray Place 2 sprays into the nose daily.  16 g  2  . ibuprofen (ADVIL,MOTRIN) 400 MG tablet       . levocetirizine (XYZAL) 5 MG tablet TAKE 1 TABLET BY MOUTH EVERY DAY  90 tablet  1  . norethindrone-ethinyl estradiol (JUNEL FE 1/20) 1-20 MG-MCG per tablet Take 1 tablet by mouth daily.        Marland Kitchen omeprazole-sodium bicarbonate (ZEGERID) 40-1100 MG per capsule Take 1 capsule by mouth daily.  90 capsule  1  . ranitidine (ZANTAC) 300 MG tablet TAKE 1 TABLET AT BEDTIME  30 tablet  1  . sertraline (ZOLOFT) 100 MG tablet Take 1 tablet (100 mg total) by mouth daily.  30 tablet  3  . DISCONTD: butalbital-acetaminophen-caffeine (FIORICET) 50-325-40 MG per tablet Take 1 tablet by mouth 3 (three) times daily as needed. headache  40 tablet  0  . DISCONTD: Multiple Vitamin (MULTIVITAMIN) tablet Take 1 tablet by mouth daily.        Marland Kitchen DISCONTD: aspirin (ASPIRIN EC) 81 MG EC tablet Take 1 tablet (81 mg total) by mouth daily. Swallow whole.  30 tablet  0  . DISCONTD: azelastine (OPTIVAR) 0.05 % ophthalmic solution       . DISCONTD: carisoprodol (SOMA) 350 MG tablet       . DISCONTD: gabapentin (NEURONTIN) 100 MG capsule Take 1  capsule (100 mg total) by mouth daily.  30 capsule  2  . DISCONTD: HYDROcodone-acetaminophen (VICODIN) 5-500 MG per tablet Take 1 tablet by mouth every 6 (six) hours as needed. As needed for menstrual cycle       . DISCONTD: meloxicam (MOBIC) 15 MG tablet Take 1 tablet (15 mg total) by mouth daily as needed. Prn pain with food  30 tablet  2  . DISCONTD: omeprazole-sodium bicarbonate (ZEGERID) 40-1100 MG per capsule Take 1 capsule by mouth daily.  30 capsule  0  . DISCONTD: SUMAtriptan (IMITREX) 25 MG tablet         Venlafaxine  Past Medical History  Diagnosis Date  . Anxiety   . Depression     no counseling  . GERD (gastroesophageal reflux disease)   . Allergy     year round  . Hyperlipidemia 2002  . IBS (irritable bowel syndrome) 2008    improved over  last couple of years using Align  . Interstitial cystitis 2006    Dr Marcine Matar  . Pulmonary nodule 3/24/20101    Incidental finding of CT Chest ordered for chest pain  . Personal history of kidney stones 1986  . Asthma     mild, intermittent, allergy triggered  . Obesity   . Fibromyalgia 1997    dx by rheumatology, no recent flares  . Hiatal hernia   . Nasal lesion 10/24/2010  . PULMONARY NODULE 02/10/2009  . OBESITY 02/10/2009  . NECK PAIN 12/15/2009  . HYPERLIPIDEMIA 02/10/2009  . GERD 02/10/2009  . DEPRESSION 02/10/2009  . Chronic laryngitis 04/26/2009  . ANXIETY 02/10/2009  . ALLERGIC RHINITIS 02/10/2009  . Rash 10/24/2010  . Knee pain, right 12/01/2010  . Pedal edema 02/11/2011  . RUQ pain 02/22/2011  . Endometriosis 03/20/2011  . Headache 04/30/2011  . Fasciculations of muscle 08/07/2011  . Elevated BP 08/07/2011  . Atypical chest pain 08/07/2011    Past Surgical History  Procedure Date  . Kidney stone surgery 1986  . Dilation and curettage of uterus 2002  . Tonsillectomy   . Polypectomy     uterine    History   Social History  . Marital Status: Married    Spouse Name: N/A    Number of Children: 2  . Years of Education: N/A   Occupational History  . unemployed    Social History Main Topics  . Smoking status: Former Games developer  . Smokeless tobacco: Never Used   Comment: when a teenager  . Alcohol Use: No  . Drug Use: No  . Sexually Active: Yes -- Female partner(s)   Other Topics Concern  . Not on file   Social History Narrative  . No narrative on file    Family History  Problem Relation Age of Onset  . Cancer Mother     breast/ ovarian  . Diabetes Mother   . Hyperlipidemia Mother   . Hypertension Mother   . Cancer Maternal Aunt     colon  . Arthritis Paternal Grandmother   . Heart disease Paternal Grandmother   . Stroke Father 70  . Migraines Sister   . Cancer Maternal Grandmother   . Cancer Maternal Grandfather     ROS: General: no  fevers/chills/night sweats. Positive for weight gain over many years. About 10 years ago she started on Paxil for depression and she gained 30 pounds. She subsequently has had a total of 100 pound weight gain from her baseline. Eyes: no blurry vision, diplopia, or amaurosis ENT: no sore  throat or hearing loss Resp: no cough, wheezing, or hemoptysis CV: no edema or palpitations GI: no abdominal pain, nausea, vomiting, diarrhea, or constipation GU: no dysuria, frequency, or hematuria Skin: no rash Neuro: no headache, numbness, tingling, or weakness of extremities. Positive for anxiety Musculoskeletal: no joint pain or swelling Heme: no bleeding, DVT, or easy bruising Endo: no polydipsia or polyuria  BP 122/78  Pulse 72  Ht 5\' 11"  (1.803 m)  Wt 116.629 kg (257 lb 1.9 oz)  BMI 35.86 kg/m2  LMP 07/23/2011  PHYSICAL EXAM: Pt is alert and oriented, very pleasant overweight woman, in no distress. HEENT: normal Neck: JVP normal. Carotid upstrokes normal without bruits. No thyromegaly. Lungs: equal expansion, clear bilaterally CV: Apex is discrete and nondisplaced, RRR without murmur or gallop Abd: soft, NT, +BS, no bruit, no hepatosplenomegaly Back: no CVA tenderness Ext: no C/C/E        Femoral pulses 2+= without bruits        DP/PT pulses intact and = Skin: warm and dry without rash Neuro: CNII-XII intact             Strength intact = bilaterally  EKG:  Reviewed from August 06, 2011. This shows normal sinus rhythm 77 beats per minute, within normal limits.  ASSESSMENT AND PLAN:

## 2011-08-12 NOTE — Patient Instructions (Signed)
Your physician has requested that you have a lexiscan myoview. For further information please visit [email or web address] Please follow instruction sheet, as given.  The current medical regimen is effective;  continue present plan and medications.  Follow up as needed

## 2011-08-12 NOTE — Assessment & Plan Note (Signed)
I reviewed the patient's lipid profiles. I agree with initiation of low-dose statin drug as has already been done. I spent a long time talking to her about statins side effects and risk/benefit ratio. While the indication for statin drugs in primary prevention are certainly not as strong as those in secondary prevention. I think in these circumstances when the LDL cholesterol is markedly elevated, then a common sense approach would be to treat her with a statin agent. If she develops recurrent cognitive dysfunction I advised her to discontinue statins altogether and at that point a non-statin drug could be considered. Further management per Dr. Abner Greenspan.

## 2011-08-12 NOTE — Assessment & Plan Note (Signed)
The patient has chest pain with both typical and atypical features. While there is an exertional component present and the pain is described as a pressure, it only occurs when she is bending forward and exerting herself. It's possible that this is related to a hiatal hernia, but this could represent cardiac disease as well. Because the patient has marked hyperlipidemia with LDL cholesterol greater than 200 she is at increased risk of premature coronary atherosclerosis. I recommended a Myoview stress test to rule out significant cardiac ischemia as an etiology of her chest pain.

## 2011-08-12 NOTE — Telephone Encounter (Signed)
She could try zoloft at 75 mg daily. We could call in the 50 mg tabs and she could try 1 1/2 tab daily. She should continue her vitamin D3 at 2000iu daily. Her vitamin d is normal but low normal

## 2011-08-13 ENCOUNTER — Telehealth: Payer: Self-pay

## 2011-08-13 NOTE — Telephone Encounter (Signed)
Patient called stating her sister takes Lexapro and she is wandering if she should switch to that also? Please advise?

## 2011-08-13 NOTE — Telephone Encounter (Signed)
Left a detailed message on patients voicemail and asked patient to return my call

## 2011-08-13 NOTE — Telephone Encounter (Signed)
She is welcome to switch from Sertraline to Lexapro, they are similar so we can make a straight switch from Sertraline 50mg  to Lexapro 10 mg daily, disp #30 with 1 rf and have her back in 4 ish weeks to discuss her response, sooner as needed

## 2011-08-14 MED ORDER — ESCITALOPRAM OXALATE 10 MG PO TABS: 10.0000 mg | ORAL_TABLET | Freq: Every day | ORAL | Status: DC

## 2011-08-14 NOTE — Telephone Encounter (Signed)
Detailed message left on patients voicemail and sent RX to pharmacy

## 2011-08-15 ENCOUNTER — Ambulatory Visit (HOSPITAL_COMMUNITY): Payer: Medicare HMO | Attending: Cardiology | Admitting: Radiology

## 2011-08-15 VITALS — BP 122/75 | Ht 71.0 in | Wt 255.0 lb

## 2011-08-15 DIAGNOSIS — Z87891 Personal history of nicotine dependence: Secondary | ICD-10-CM | POA: Insufficient documentation

## 2011-08-15 DIAGNOSIS — R42 Dizziness and giddiness: Secondary | ICD-10-CM | POA: Insufficient documentation

## 2011-08-15 DIAGNOSIS — R072 Precordial pain: Secondary | ICD-10-CM | POA: Insufficient documentation

## 2011-08-15 DIAGNOSIS — R079 Chest pain, unspecified: Secondary | ICD-10-CM

## 2011-08-15 DIAGNOSIS — E785 Hyperlipidemia, unspecified: Secondary | ICD-10-CM | POA: Insufficient documentation

## 2011-08-15 DIAGNOSIS — R002 Palpitations: Secondary | ICD-10-CM | POA: Insufficient documentation

## 2011-08-15 DIAGNOSIS — R000 Tachycardia, unspecified: Secondary | ICD-10-CM | POA: Insufficient documentation

## 2011-08-15 HISTORY — PX: CARDIOVASCULAR STRESS TEST: SHX262

## 2011-08-15 MED ORDER — TECHNETIUM TC 99M TETROFOSMIN IV KIT
33.0000 | PACK | Freq: Once | INTRAVENOUS | Status: AC | PRN
  Administered 2011-08-15: 33 via INTRAVENOUS

## 2011-08-15 MED ORDER — TECHNETIUM TC 99M TETROFOSMIN IV KIT
11.0000 | PACK | Freq: Once | INTRAVENOUS | Status: AC | PRN
  Administered 2011-08-15: 11 via INTRAVENOUS

## 2011-08-15 NOTE — Progress Notes (Signed)
[facility] SITE 3 NUCLEAR MED [address] [place] [address] [phone]  Cardiology Nuclear Med Study  Mallory [name] is a 46 y.o. female [id number] [birth year]   Nuclear Med Background Indication for Stress Test:  Evaluation for Ischemia History:  No previous documented CAD and Asthma Cardiac Risk Factors: History of Smoking and Lipids  Symptoms:  Chest Pain, Chest Pressure with Exertion (last date of chest discomfort last week), Light-Headedness, Palpitations and Rapid HR   Nuclear Pre-Procedure Caffeine/Decaff Intake:  None NPO After: 8:00am   Lungs:  Clear IV 0.9% NS with Angio Cath:  22g  IV Site: R Hand  IV Started by:  Bonnita Levan, RN  Chest Size (in):  42 Cup Size: C  Height: 5\' 11"  (1.803 m)  Weight:  255 lb (115.667 kg)  BMI:  Body mass index is 35.57 kg/(m^2). Tech Comments:  N/A    Nuclear Med Study 1 or 2 day study: 1 day  Stress Test Type:  Stress  Reading MD: Willa Rough, MD  Order Authorizing Provider:  Tonny Bollman, MD  Resting Radionuclide: Technetium 8m Tetrofosmin  Resting Radionuclide Dose: 11.0 mCi   Stress Radionuclide:  Technetium 39m Tetrofosmin  Stress Radionuclide Dose: 33.0 mCi           Stress Protocol Rest HR: 78 Stress HR: 169  Rest BP: 122/75 Stress BP: 204/73  Exercise Time (min): 7:22 METS: 8.4   Predicted Max HR: 175 bpm % Max HR: 96.57 bpm Rate Pressure Product: 29528   Dose of Adenosine (mg):  n/a Dose of Lexiscan: n/a mg  Dose of Atropine (mg): n/a Dose of Dobutamine: n/a mcg/kg/min (at max HR)  Stress Test Technologist: Bonnita Levan, RN  Nuclear Technologist:  Doyne Keel, CNMT     Rest Procedure:  Myocardial perfusion imaging was performed at rest 45 minutes following the intravenous administration of Technetium 16m Tetrofosmin. Rest ECG: NSR  Stress Procedure:  The patient exercised for 7:22.  The patient stopped due to fatigue and significant dyspnea and denied any chest pain.  There were no  significant ST-T wave changes.  Technetium 42m Tetrofosmin was injected at peak exercise and myocardial perfusion imaging was performed after a brief delay. Stress ECG: No significant change from baseline ECG  QPS Raw Data Images:  Normal; no motion artifact; normal heart/lung ratio. Stress Images:  Normal homogeneous uptake in all areas of the myocardium. Rest Images:  Normal homogeneous uptake in all areas of the myocardium. Subtraction (SDS):  No evidence of ischemia. Transient Ischemic Dilatation (Normal <1.22):  0.95 Lung/Heart Ratio (Normal <0.45):  0.37  Quantitative Gated Spect Images QGS EDV:  86 ml QGS ESV:  24 ml QGS cine images:  Normal Wall Motion QGS EF: 72%  Impression Exercise Capacity:  Fair exercise capacity. BP Response:  Hypertensive blood pressure response. Clinical Symptoms:  No chest pain. ECG Impression:  No significant ST segment change suggestive of ischemia. Comparison with Prior Nuclear Study: No previous nuclear study performed  Overall Impression:  Normal stress nuclear study.  There is interference from nuclear activity from structures below the diaphragm. This is seen primarily in the rest images. This does not affect the ability to read the study. This is a normal study.  Willa Rough, MD

## 2011-08-20 ENCOUNTER — Telehealth: Payer: Self-pay | Admitting: Cardiovascular Disease

## 2011-08-20 NOTE — Telephone Encounter (Signed)
Pt aware of myoview results by phone.  

## 2011-08-20 NOTE — Telephone Encounter (Signed)
Left message to call back  

## 2011-08-20 NOTE — Telephone Encounter (Signed)
Fu call °Patient returning your call about test results °

## 2011-08-22 ENCOUNTER — Other Ambulatory Visit (HOSPITAL_COMMUNITY): Payer: Private Health Insurance - Indemnity

## 2011-08-23 ENCOUNTER — Other Ambulatory Visit: Payer: Self-pay | Admitting: Family Medicine

## 2011-09-03 ENCOUNTER — Telehealth: Payer: Self-pay

## 2011-09-03 MED ORDER — LEVOCETIRIZINE DIHYDROCHLORIDE 5 MG PO TABS: 5.0000 mg | ORAL_TABLET | Freq: Every day | ORAL | Status: DC

## 2011-09-03 MED ORDER — RANITIDINE HCL 300 MG PO TABS: 300.0000 mg | ORAL_TABLET | Freq: Every day | ORAL | Status: DC

## 2011-09-03 NOTE — Telephone Encounter (Signed)
I have never used Savella and Zoloft together but would consider it. Would want to keep her Zoloft around 50 mg if we were going to add Savella to it and she would need a visit to do this. She can have refills on Ranitidine 300 mg and Xyzal 5 mg, same sig, same #

## 2011-09-03 NOTE — Telephone Encounter (Signed)
Pt informed and is going to keep her appt for next week

## 2011-09-03 NOTE — Telephone Encounter (Signed)
Pt would like refill on Ranitidine and Xyzal (I sent RX). Pt states she stopped taking Lipitor 3 weeks ago but is going to start taking it again. Pt thought it was causing her pain but realized it was her fibromyalgia. Pt states she is supposed to come back in for a follow up next week but is wandering if she should wait for a few weeks since starting the Lipitor?  Pt also stated that her sister n law was diagnosed with fibromyalgia and she takes the [place] and states it really helps with her pain? Pt would like to know if MD thinks that the [facility] would help her and if she could continue to take the Zoloft with it?

## 2011-09-05 ENCOUNTER — Other Ambulatory Visit: Payer: Self-pay

## 2011-09-05 MED ORDER — RANITIDINE HCL 300 MG PO TABS: 300.0000 mg | ORAL_TABLET | Freq: Every day | ORAL | Status: DC

## 2011-09-09 ENCOUNTER — Ambulatory Visit: Payer: Private Health Insurance - Indemnity | Admitting: Family Medicine

## 2011-10-03 ENCOUNTER — Encounter: Payer: Self-pay | Admitting: Family Medicine

## 2011-10-03 ENCOUNTER — Ambulatory Visit: Payer: Self-pay | Admitting: Family Medicine

## 2011-10-03 ENCOUNTER — Ambulatory Visit (INDEPENDENT_AMBULATORY_CARE_PROVIDER_SITE_OTHER): Payer: Managed Care, Other (non HMO) | Admitting: Family Medicine

## 2011-10-03 VITALS — BP 117/78 | HR 81 | Temp 98.4°F | Ht 71.5 in | Wt 262.1 lb

## 2011-10-03 DIAGNOSIS — F341 Dysthymic disorder: Secondary | ICD-10-CM

## 2011-10-03 DIAGNOSIS — F418 Other specified anxiety disorders: Secondary | ICD-10-CM

## 2011-10-03 DIAGNOSIS — R0789 Other chest pain: Secondary | ICD-10-CM

## 2011-10-03 DIAGNOSIS — R030 Elevated blood-pressure reading, without diagnosis of hypertension: Secondary | ICD-10-CM

## 2011-10-03 DIAGNOSIS — E785 Hyperlipidemia, unspecified: Secondary | ICD-10-CM

## 2011-10-03 MED ORDER — SERTRALINE HCL 50 MG PO TABS: 75.0000 mg | ORAL_TABLET | Freq: Every day | ORAL | Status: DC

## 2011-10-03 NOTE — Progress Notes (Signed)
Patient ID: Mallory [name], female   DOB: [birth year], 46 y.o.   MRN: [id number] Mallory [name] [id number] 27-Jan-1966 10/03/2011      Progress Note-Follow Up  Subjective  Chief Complaint  Chief Complaint  Patient presents with  . Follow-up    on medication    HPI  Patient is a 46 year old Caucasian female who is in today to discuss multiple problems. She'll long-standing history of OCD, depression and anxiety dating back to childhood. Over time she is taking multiple medications including fluoxetine which she says cost 50 pound weight gain. Certainly she says has helped her OCD the most but she is afraid that contribute to her weight gain as well. She was asked in the last few months to switch to Lexapro and or to have her sertraline increased but is never proceeded. She acknowledges she still struggles with her OCD is unable to drive on highways. Would like to try working with her meds more to get better control over this period she has agreed to accompany the past and has not receded but would consider now. She denies any suicidal ideation and does feel better as he is better than it is but is not on meds she still struggles with anxiety depression and she just changed jobs so that has been somewhat worse recently. No chest pain, palpitations, shortness of breath, GI or GU complaints no dysphagia. She is still very to take statins. Will cause her thinking to be foggy. Potential side effects are another concern  Past Medical History  Diagnosis Date  . Anxiety   . Depression     no counseling  . GERD (gastroesophageal reflux disease)   . Allergy     year round  . Hyperlipidemia 2002  . IBS (irritable bowel syndrome) 2008    improved over last couple of years using Align  . Interstitial cystitis 2006    Dr Marcine Matar  . Pulmonary nodule 3/24/20101    Incidental finding of CT Chest ordered for chest pain  . Personal history of kidney stones 1986  . Asthma     mild, intermittent,  allergy triggered  . Obesity   . Fibromyalgia 1997    dx by rheumatology, no recent flares  . Hiatal hernia   . Nasal lesion 10/24/2010  . PULMONARY NODULE 02/10/2009  . OBESITY 02/10/2009  . NECK PAIN 12/15/2009  . HYPERLIPIDEMIA 02/10/2009  . GERD 02/10/2009  . DEPRESSION 02/10/2009  . Chronic laryngitis 04/26/2009  . ANXIETY 02/10/2009  . ALLERGIC RHINITIS 02/10/2009  . Rash 10/24/2010  . Knee pain, right 12/01/2010  . Pedal edema 02/11/2011  . RUQ pain 02/22/2011  . Endometriosis 03/20/2011  . Headache 04/30/2011  . Fasciculations of muscle 08/07/2011  . Elevated BP 08/07/2011  . Atypical chest pain 08/07/2011    Past Surgical History  Procedure Date  . Kidney stone surgery 1986  . Dilation and curettage of uterus 2002  . Tonsillectomy   . Polypectomy     uterine    Family History  Problem Relation Age of Onset  . Cancer Mother     breast/ ovarian  . Diabetes Mother   . Hyperlipidemia Mother   . Hypertension Mother   . Cancer Maternal Aunt     colon  . Arthritis Paternal Grandmother   . Heart disease Paternal Grandmother   . Stroke Father 57  . Migraines Sister   . Cancer Maternal Grandmother   . Cancer Maternal Grandfather     History   Social  History  . Marital Status: Married    Spouse Name: N/A    Number of Children: 2  . Years of Education: N/A   Occupational History  . unemployed    Social History Main Topics  . Smoking status: Former Smoker    Types: Cigarettes    Quit date: 06/24/1985  . Smokeless tobacco: Never Used   Comment: when a teenager  . Alcohol Use: No  . Drug Use: No  . Sexually Active: Yes -- Female partner(s)   Other Topics Concern  . Not on file   Social History Narrative  . No narrative on file    Current Outpatient Prescriptions on File Prior to Visit  Medication Sig Dispense Refill  . bifidobacterium infantis (ALIGN) capsule Take 1 capsule by mouth daily.        . Cholecalciferol (VITAMIN D3) 2000 UNITS TABS Take 2,000 Units by  mouth daily.      . clidinium-chlordiazepoxide (LIBRAX) 2.5-5 MG per capsule Take 1 capsule by mouth 2 (two) times daily. As needed.       . Flaxseed, Linseed, (FLAXSEED OIL) 1000 MG CAPS Take 2 capsules (2,000 mg total) by mouth daily.  60 capsule  0  . ibuprofen (ADVIL,MOTRIN) 400 MG tablet       . levocetirizine (XYZAL) 5 MG tablet Take 1 tablet (5 mg total) by mouth daily.  90 tablet  1  . norethindrone-ethinyl estradiol (JUNEL FE 1/20) 1-20 MG-MCG per tablet Take 1 tablet by mouth daily.        Marland Kitchen omeprazole-sodium bicarbonate (ZEGERID) 40-1100 MG per capsule Take 1 capsule by mouth daily.  90 capsule  1  . ranitidine (ZANTAC) 300 MG tablet Take 1 tablet (300 mg total) by mouth daily.  90 tablet  1  . albuterol (PROAIR HFA) 108 (90 BASE) MCG/ACT inhaler Inhale 1 -2 puffs by mouth every 4-6 hours as needed.       Marland Kitchen aspirin (ASPIRIN EC) 81 MG EC tablet Take 1 tablet (81 mg total) by mouth daily. Swallow whole.  30 tablet  12  . atorvastatin (LIPITOR) 10 MG tablet Take 1 tablet (10 mg total) by mouth daily.  30 tablet  3  . fluticasone (FLONASE) 50 MCG/ACT nasal spray Place 2 sprays into the nose daily.  16 g  2  . JUNEL 1/20 1-20 MG-MCG tablet       . sertraline (ZOLOFT) 50 MG tablet Take 1.5 tablets (75 mg total) by mouth daily.  135 tablet  1  . DISCONTD: omeprazole-sodium bicarbonate (ZEGERID) 40-1100 MG per capsule TAKE ONE CAPSULE BY MOUTH EVERY DAY  90 capsule  1    Allergies  Allergen Reactions  . Venlafaxine     REACTION: bad dreams    Review of Systems  Review of Systems  Constitutional: Negative for fever and chills.  HENT: Negative for hearing loss, nosebleeds and congestion.   Eyes: Negative for discharge.  Respiratory: Negative for cough, sputum production, shortness of breath and wheezing.   Cardiovascular: Negative for chest pain, palpitations and leg swelling.  Gastrointestinal: Negative for heartburn, nausea, vomiting, abdominal pain, diarrhea, constipation and blood  in stool.  Genitourinary: Negative for dysuria, urgency, frequency and hematuria.  Musculoskeletal: Negative for myalgias, back pain and falls.  Skin: Negative for rash.  Neurological: Negative for dizziness, tremors, sensory change, focal weakness, loss of consciousness, weakness and headaches.  Endo/Heme/Allergies: Negative for polydipsia. Does not bruise/bleed easily.  Psychiatric/Behavioral: Positive for depression. Negative for suicidal ideas. The patient is nervous/anxious.  The patient does not have insomnia.     Objective  BP 117/78  Pulse 81  Temp(Src) 98.4 F (36.9 C) (Temporal)  Ht 5' 11.5" (1.816 m)  Wt 262 lb 1.9 oz (118.897 kg)  BMI 36.05 kg/m2  SpO2 97%  Physical Exam  Physical Exam  Constitutional: She is oriented to person, place, and time and well-developed, well-nourished, and in no distress. No distress.  HENT:  Head: Normocephalic and atraumatic.  Eyes: Conjunctivae are normal.  Neck: Neck supple. No thyromegaly present.  Cardiovascular: Normal rate, regular rhythm and normal heart sounds.   No murmur heard. Pulmonary/Chest: Effort normal and breath sounds normal. She has no wheezes.  Abdominal: She exhibits no distension and no mass.  Musculoskeletal: She exhibits no edema.  Lymphadenopathy:    She has no cervical adenopathy.  Neurological: She is alert and oriented to person, place, and time.  Skin: Skin is warm and dry. No rash noted. She is not diaphoretic.  Psychiatric: Memory, affect and judgment normal.    Lab Results  Component Value Date   TSH 1.375 08/06/2011   Lab Results  Component Value Date   WBC 11.4* 08/06/2011   HGB 13.8 08/06/2011   HCT 42.5 08/06/2011   MCV 85.9 08/06/2011   PLT 311 08/06/2011   Lab Results  Component Value Date   CREATININE 0.89 08/06/2011   BUN 13 08/06/2011   NA 139 08/06/2011   K 4.0 08/06/2011   CL 102 08/06/2011   CO2 24 08/06/2011   Lab Results  Component Value Date   ALT 18 08/06/2011   AST 15 08/06/2011    ALKPHOS 119* 08/06/2011   BILITOT 0.4 08/06/2011   Lab Results  Component Value Date   CHOL 310* 08/06/2011   Lab Results  Component Value Date   HDL 42 08/06/2011   Lab Results  Component Value Date   LDLCALC 229* 08/06/2011   Lab Results  Component Value Date   TRIG 193* 08/06/2011   Lab Results  Component Value Date   CHOLHDL 7.4 08/06/2011     Assessment & Plan  Atypical chest pain Patient has completed her stress test and come back with normal results, she is reassured and does not c/o any chest pain today  Elevated BP Normal today  HYPERLIPIDEMIA Very hi but patient continues to read things that make her not want to take statins, she feels it makes her fuzzy headed. She is choosing to stop her statin and try Cholestoff, it is explained to her that this is a similar premise and she could have the same side effects, she should start CoQ 10. She can add Niacin as directed next month as tolerated. Avoid trans fats  Depression with anxiety Patient with a long history of symptoms dating back to childhood and has not done well without meds when she has tried to come off. She asked to switch to Lexapro and did not follow through. She asked to increase her Sertraline to 100mg  daily and did not proceed. She is afraid of weight gain because she feels the meds contribute to her weight gain. She does agree to try Sertraline 50 mg to 1 1/2 tab daily and then to 100mg  daily as tolerated since she does not believe she is doing well enough.  She has agreed to call [facility] and start working with them. More thatn 60 minutes is spent with this patient 95% of the time is spent in counseling    2

## 2011-10-03 NOTE — Assessment & Plan Note (Signed)
Patient with a long history of symptoms dating back to childhood and has not done well without meds when she has tried to come off. She asked to switch to Lexapro and did not follow through. She asked to increase her Sertraline to 100mg  daily and did not proceed. She is afraid of weight gain because she feels the meds contribute to her weight gain. She does agree to try Sertraline 50 mg to 1 1/2 tab daily and then to 100mg  daily as tolerated since she does not believe she is doing well enough.  She has agreed to call [facility] and start working with them. More thatn 60 minutes is spent with this patient 95% of the time is spent in counseling

## 2011-10-03 NOTE — Assessment & Plan Note (Signed)
Patient has completed her stress test and come back with normal results, she is reassured and does not c/o any chest pain today

## 2011-10-03 NOTE — Assessment & Plan Note (Signed)
Very hi but patient continues to read things that make her not want to take statins, she feels it makes her fuzzy headed. She is choosing to stop her statin and try Cholestoff, it is explained to her that this is a similar premise and she could have the same side effects, she should start CoQ 10. She can add Niacin as directed next month as tolerated. Avoid trans fats

## 2011-10-03 NOTE — Assessment & Plan Note (Signed)
Normal today

## 2011-10-03 NOTE — Patient Instructions (Signed)
Hypercholesterolemia High Blood Cholesterol Cholesterol is a white, waxy, fat-like protein needed by your body in small amounts. The liver makes all the cholesterol you need. It is carried from the liver by the blood through the blood vessels. Deposits (plaque) may build up on blood vessel walls. This makes the arteries narrower and stiffer. Plaque increases the risk for heart attack and stroke. You cannot feel your cholesterol level even if it is very high. The only way to know is by a blood test to check your lipid (fats) levels. Once you know your cholesterol levels, you should keep a record of the test results. Work with your caregiver to to keep your levels in the desired range. WHAT THE RESULTS MEAN:  Total cholesterol is a rough measure of all the cholesterol in your blood.   LDL is the so-called bad cholesterol. This is the type that deposits cholesterol in the walls of the arteries. You want this level to be low.   HDL is the good cholesterol because it cleans the arteries and carries the LDL away. You want this level to be high.   Triglycerides are fat that the body can either burn for energy or store. High levels are closely linked to heart disease.  DESIRED LEVELS:  Total cholesterol below 200.   LDL below 100 for people at risk, below 70 for very high risk.   HDL above 50 is good, above 60 is best.   Triglycerides below 150.  HOW TO LOWER YOUR CHOLESTEROL:  Diet.   Choose fish or white meat chicken and [place], roasted or baked. Limit fatty cuts of red meat, fried foods, and processed meats, such as sausage and lunch meat.   Eat lots of fresh fruits and vegetables. Choose whole grains, beans, pasta, potatoes and cereals.   Use only small amounts of olive, corn or canola oils. Avoid butter, mayonnaise, shortening or palm kernel oils. Avoid foods with trans-fats.   Use skim/nonfat milk and low-fat/nonfat yogurt and cheeses. Avoid whole milk, cream, ice cream, egg yolks and  cheeses. Healthy desserts include angel food cake, gingersnaps, animal crackers, hard candy, popsicles, and low-fat/nonfat frozen yogurt. Avoid pastries, cakes, pies and cookies.   Exercise.   A regular program helps decrease LDL and raises HDL.   Helps with weight control.   Do things that increase your activity level like gardening, walking, or taking the stairs.   Medication.   May be prescribed by your caregiver to help lowering cholesterol and the risk for heart disease.   You may need medicine even if your levels are normal if you have several risk factors.  HOME CARE INSTRUCTIONS   Follow your diet and exercise programs as suggested by your caregiver.   Take medications as directed.   Have blood work done when your caregiver feels it is necessary.  MAKE SURE YOU:   Understand these instructions.   Will watch your condition.   Will get help right away if you are not doing well or get worse.  Document Released: 06/10/2005 Document Revised: 05/30/2011 Document Reviewed: 11/26/2006 [facility] Patient Information 2012 [place], [place].    Either start Cholestoff and Co q 10 this month  Or   Start Niacin XR 500mg  and titrate up to 2000mg  as tolerated, with a lowfat snack and 81 mg aspirin hour ahead and take at bedtime

## 2011-10-07 ENCOUNTER — Telehealth: Payer: Self-pay

## 2011-10-07 MED ORDER — PROMETHAZINE HCL 25 MG PO TABS: 25.0000 mg | ORAL_TABLET | Freq: Two times a day (BID) | ORAL | Status: DC | PRN

## 2011-10-07 NOTE — Telephone Encounter (Signed)
Patient called and asked for MD to call Phenergan into the pharmacy for her? Pt states she has been nauseous for the last couple of weeks (hasn't vomitted). Pt states she has used phenergan in the past and it helps? Please advise? Patient call back [phone]

## 2011-10-07 NOTE — Telephone Encounter (Signed)
OK to call her in Phenergan 25 mg tab 1 tab po bid prn nausea/vomit, disp #40, 1 rf, remind her to use ginger prn

## 2011-10-07 NOTE — Telephone Encounter (Signed)
Patient informed and RX sent to pharmacy 

## 2011-10-15 ENCOUNTER — Other Ambulatory Visit: Payer: Self-pay | Admitting: Family Medicine

## 2011-10-15 MED ORDER — FLUTICASONE PROPIONATE 50 MCG/ACT NA SUSP: 2.0000 | Freq: Every day | NASAL | Status: DC

## 2011-10-15 NOTE — Telephone Encounter (Signed)
RX sent to pharmacy  

## 2011-10-16 ENCOUNTER — Other Ambulatory Visit: Payer: Self-pay

## 2011-10-16 MED ORDER — FLUTICASONE PROPIONATE 50 MCG/ACT NA SUSP: 2.0000 | Freq: Every day | NASAL | Status: DC

## 2011-10-21 ENCOUNTER — Other Ambulatory Visit: Payer: Self-pay

## 2011-10-21 MED ORDER — IBUPROFEN 400 MG PO TABS: 400.0000 mg | ORAL_TABLET | Freq: Three times a day (TID) | ORAL | Status: DC | PRN

## 2011-11-05 ENCOUNTER — Telehealth: Payer: Self-pay

## 2011-11-05 NOTE — Telephone Encounter (Signed)
So I am hesitant to have her come off but it is up to her. If she is taking 75 mg daily drop to 50 daily for a month and then down to 25 mg daily for a month and then stop. Come see me in a month to see how this is going

## 2011-11-05 NOTE — Telephone Encounter (Signed)
Patient left a message stating she was ready to wean off of Zoloft? Pt stated that she needs directions on how to wean off. Please advise?

## 2011-11-06 NOTE — Telephone Encounter (Signed)
Left a detailed message on patients voicemail.

## 2011-11-22 ENCOUNTER — Encounter: Payer: Self-pay | Admitting: Family Medicine

## 2011-11-22 ENCOUNTER — Ambulatory Visit (INDEPENDENT_AMBULATORY_CARE_PROVIDER_SITE_OTHER): Payer: Managed Care, Other (non HMO) | Admitting: Family Medicine

## 2011-11-22 VITALS — BP 124/79 | HR 82 | Temp 99.2°F | Ht 71.5 in | Wt 261.0 lb

## 2011-11-22 DIAGNOSIS — H60399 Other infective otitis externa, unspecified ear: Secondary | ICD-10-CM

## 2011-11-22 DIAGNOSIS — H60509 Unspecified acute noninfective otitis externa, unspecified ear: Secondary | ICD-10-CM

## 2011-11-22 MED ORDER — NEOMYCIN-POLYMYXIN-HC 1 % OT SOLN: OTIC | Status: DC

## 2011-11-22 NOTE — Assessment & Plan Note (Signed)
Mild. Will do trial of cortisporin otic 5 drops tid x 7d.

## 2011-11-22 NOTE — Progress Notes (Signed)
OFFICE VISIT  11/22/2011   CC:  Chief Complaint  Patient presents with  . multiple issues    ear pain since yesterday, nausea x 2 weeks-worse this week, feeling hot, but not like hot flash-? fevered     HPI:    Patient is a 46 y.o. Caucasian female who presents for left ear pain, nausea, and "feel hot". Onset of ear pain yesterday morning, worsening, peroxide and pseudefed no help.  Hurts to touch it and hurts to swallow and cough.  No drainage.  Has allergic rhinitis but her usual stuffiness has not changed dramatically lately.     Past Medical History  Diagnosis Date  . Anxiety   . Depression     no counseling  . GERD (gastroesophageal reflux disease)   . Allergy     year round  . Hyperlipidemia 2002  . IBS (irritable bowel syndrome) 2008    improved over last couple of years using Align  . Interstitial cystitis 2006    Dr Marcine Matar  . Pulmonary nodule 3/24/20101    Incidental finding of CT Chest ordered for chest pain  . Personal history of kidney stones 1986  . Asthma     mild, intermittent, allergy triggered  . Obesity   . Fibromyalgia 1997    dx by rheumatology, no recent flares  . Hiatal hernia   . Nasal lesion 10/24/2010  . PULMONARY NODULE 02/10/2009  . OBESITY 02/10/2009  . NECK PAIN 12/15/2009  . HYPERLIPIDEMIA 02/10/2009  . GERD 02/10/2009  . DEPRESSION 02/10/2009  . Chronic laryngitis 04/26/2009  . ANXIETY 02/10/2009  . ALLERGIC RHINITIS 02/10/2009  . Rash 10/24/2010  . Knee pain, right 12/01/2010  . Pedal edema 02/11/2011  . RUQ pain 02/22/2011  . Endometriosis 03/20/2011  . Headache 04/30/2011  . Fasciculations of muscle 08/07/2011  . Elevated BP 08/07/2011  . Atypical chest pain 08/07/2011    Past Surgical History  Procedure Date  . Kidney stone surgery 1986  . Dilation and curettage of uterus 2002  . Tonsillectomy   . Polypectomy     uterine    Outpatient Prescriptions Prior to Visit  Medication Sig Dispense Refill  . albuterol (PROAIR HFA)  108 (90 BASE) MCG/ACT inhaler Inhale 1 -2 puffs by mouth every 4-6 hours as needed.       . ALPRAZolam (XANAX) 0.5 MG tablet Take 0.25 mg by mouth at bedtime as needed.      . bifidobacterium infantis (ALIGN) capsule Take 1 capsule by mouth daily.        . Cholecalciferol (VITAMIN D3) 2000 UNITS TABS Take 2,000 Units by mouth daily.      . clidinium-chlordiazepoxide (LIBRAX) 2.5-5 MG per capsule Take 1 capsule by mouth 2 (two) times daily. As needed.       . fluticasone (FLONASE) 50 MCG/ACT nasal spray Place 2 sprays into the nose daily.  48 g  0  . ibuprofen (ADVIL,MOTRIN) 400 MG tablet Take 1 tablet (400 mg total) by mouth every 8 (eight) hours as needed for pain.  30 tablet  2  . levocetirizine (XYZAL) 5 MG tablet Take 1 tablet (5 mg total) by mouth daily.  90 tablet  1  . norethindrone-ethinyl estradiol (JUNEL FE 1/20) 1-20 MG-MCG per tablet Take 1 tablet by mouth daily.        Marland Kitchen omeprazole-sodium bicarbonate (ZEGERID) 40-1100 MG per capsule Take 1 capsule by mouth daily.  90 capsule  1  . promethazine (PHENERGAN) 25 MG tablet Take 1 tablet (  25 mg total) by mouth 2 (two) times daily as needed. For nausea and vomitting  40 tablet  1  . ranitidine (ZANTAC) 300 MG tablet Take 1 tablet (300 mg total) by mouth daily.  90 tablet  1  . sertraline (ZOLOFT) 50 MG tablet Take 1.5 tablets (75 mg total) by mouth daily.  135 tablet  1  . aspirin (ASPIRIN EC) 81 MG EC tablet Take 1 tablet (81 mg total) by mouth daily. Swallow whole.  30 tablet  12  . atorvastatin (LIPITOR) 10 MG tablet Take 1 tablet (10 mg total) by mouth daily.  30 tablet  3  . Flaxseed, Linseed, (FLAXSEED OIL) 1000 MG CAPS Take 2 capsules (2,000 mg total) by mouth daily.  60 capsule  0  . JUNEL 1/20 1-20 MG-MCG tablet         Allergies  Allergen Reactions  . Venlafaxine     REACTION: bad dreams    ROS As per HPI  PE: Blood pressure 124/79, pulse 82, temperature 99.2 F (37.3 C), temperature source Temporal, height 5' 11.5"  (1.816 m), weight 261 lb (118.389 kg), SpO2 98.00%. Gen: Alert, well appearing.  Patient is oriented to person, place, time, and situation. Affect: pleasant and jovial. ENT: Ears: Right EAC clear, normal epithelium and TM.  Left: EAC canal with mildly abraded-appearing epithelium but no erythema, swelling, or exudate.  TM with good light reflex and landmarks.  Eyes: no injection, icteris, swelling, or exudate.  EOMI, PERRLA. Nose: no drainage or turbinate edema/swelling.  No injection or focal lesion.  Mouth: lips without lesion/swelling.  Oral mucosa pink and moist.  Dentition intact and without obvious caries or gingival swelling.  Oropharynx without erythema, exudate, or swelling.  Neck - No masses or thyromegaly or limitation in range of motion.  Mild diffuse submandibular region soft tissue TTP CV: RRR, no m/r/g.   LUNGS: CTA bilat, nonlabored resps, good aeration in all lung fields.   LABS:  none  IMPRESSION AND PLAN:  Acute otitis externa Mild. Will do trial of cortisporin otic 5 drops tid x 7d.     FOLLOW UP: Return if symptoms worsen or fail to improve.

## 2011-11-24 ENCOUNTER — Other Ambulatory Visit: Payer: Self-pay | Admitting: Internal Medicine

## 2011-12-03 ENCOUNTER — Other Ambulatory Visit: Payer: Managed Care, Other (non HMO)

## 2011-12-05 ENCOUNTER — Other Ambulatory Visit: Payer: Self-pay

## 2011-12-05 MED ORDER — CILIDINIUM-CHLORDIAZEPOXIDE 2.5-5 MG PO CAPS: 1.0000 | ORAL_CAPSULE | Freq: Two times a day (BID) | ORAL | Status: DC

## 2011-12-10 ENCOUNTER — Ambulatory Visit: Payer: Managed Care, Other (non HMO) | Admitting: Family Medicine

## 2011-12-30 ENCOUNTER — Ambulatory Visit: Payer: Managed Care, Other (non HMO) | Admitting: Internal Medicine

## 2012-01-03 ENCOUNTER — Other Ambulatory Visit (INDEPENDENT_AMBULATORY_CARE_PROVIDER_SITE_OTHER): Payer: Managed Care, Other (non HMO)

## 2012-01-03 ENCOUNTER — Other Ambulatory Visit: Payer: Managed Care, Other (non HMO)

## 2012-01-03 DIAGNOSIS — E785 Hyperlipidemia, unspecified: Secondary | ICD-10-CM

## 2012-01-03 DIAGNOSIS — R030 Elevated blood-pressure reading, without diagnosis of hypertension: Secondary | ICD-10-CM

## 2012-01-03 LAB — CBC
HCT: 39.1 % (ref 36.0–46.0)
Hemoglobin: 12.6 g/dL (ref 12.0–15.0)
Platelets: 263 10*3/uL (ref 150.0–400.0)
RDW: 15.4 % — ABNORMAL HIGH (ref 11.5–14.6)
WBC: 9.3 10*3/uL (ref 4.5–10.5)

## 2012-01-03 LAB — RENAL FUNCTION PANEL
Albumin: 3.4 g/dL — ABNORMAL LOW (ref 3.5–5.2)
BUN: 15 mg/dL (ref 6–23)
CO2: 29 mEq/L (ref 19–32)
Chloride: 103 mEq/L (ref 96–112)
Creatinine, Ser: 0.8 mg/dL (ref 0.4–1.2)

## 2012-01-03 LAB — TSH: TSH: 1.41 u[IU]/mL (ref 0.35–5.50)

## 2012-01-03 LAB — HEPATIC FUNCTION PANEL
AST: 21 U/L (ref 0–37)
Alkaline Phosphatase: 128 U/L — ABNORMAL HIGH (ref 39–117)
Bilirubin, Direct: 0 mg/dL (ref 0.0–0.3)
Total Protein: 6.4 g/dL (ref 6.0–8.3)

## 2012-01-03 LAB — LIPID PANEL
HDL: 38.2 mg/dL — ABNORMAL LOW (ref >39.00)
VLDL: 51 mg/dL — ABNORMAL HIGH (ref 0.0–40.0)

## 2012-01-07 ENCOUNTER — Ambulatory Visit (INDEPENDENT_AMBULATORY_CARE_PROVIDER_SITE_OTHER): Payer: Managed Care, Other (non HMO) | Admitting: Family Medicine

## 2012-01-07 ENCOUNTER — Encounter: Payer: Self-pay | Admitting: Family Medicine

## 2012-01-07 VITALS — BP 126/76 | HR 81 | Temp 97.2°F | Ht 71.5 in | Wt 264.4 lb

## 2012-01-07 DIAGNOSIS — R030 Elevated blood-pressure reading, without diagnosis of hypertension: Secondary | ICD-10-CM

## 2012-01-07 DIAGNOSIS — F418 Other specified anxiety disorders: Secondary | ICD-10-CM

## 2012-01-07 DIAGNOSIS — E669 Obesity, unspecified: Secondary | ICD-10-CM

## 2012-01-07 DIAGNOSIS — F341 Dysthymic disorder: Secondary | ICD-10-CM

## 2012-01-07 DIAGNOSIS — K219 Gastro-esophageal reflux disease without esophagitis: Secondary | ICD-10-CM

## 2012-01-07 DIAGNOSIS — E785 Hyperlipidemia, unspecified: Secondary | ICD-10-CM

## 2012-01-07 MED ORDER — ESCITALOPRAM OXALATE 10 MG PO TABS: 10.0000 mg | ORAL_TABLET | Freq: Every day | ORAL | Status: DC

## 2012-01-07 NOTE — Assessment & Plan Note (Signed)
Improved total but triglycerides are up. Encouraged her to avoid carbs, use lean proteins, no trans fats and increase exercise add Krill oil caps

## 2012-01-07 NOTE — Patient Instructions (Addendum)
Depression, Adolescent and Adult Depression is a true and treatable medical condition. In general there are two kinds of depression:  Depression we all experience in some form. For example depression from the death of a loved one, financial distress or natural disasters will trigger or increase depression.   Clinical depression, on the other hand, appears without an apparent cause or reason. This depression is a disease. Depression may be caused by chemical imbalance in the body and brain or may come as a response to a physical illness. Alcohol and other drugs can cause depression.  DIAGNOSIS  The diagnosis of depression is usually based upon symptoms and medical history. TREATMENT  Treatments for depression fall into three categories. These are:  Drug therapy. There are many medicines that treat depression. Responses may vary and sometimes trial and error is necessary to determine the best medicines and dosage for a particular patient.   Psychotherapy, also called talking treatments, helps people resolve their problems by looking at them from a different point of view and by giving people insight into their own personal makeup. Traditional psychotherapy looks at a childhood source of a problem. Other psychotherapy will look at current conflicts and move toward solving those. If the cause of depression is drug use, counseling is available to help abstain. In time the depression will usually improve. If there were underlying causes for the chemical use, they can be addressed.   ECT (electroconvulsive therapy) or shock treatment is not as commonly used today. It is a very effective treatment for severe suicidal depression. During ECT electrical impulses are applied to the head. These impulses cause a generalized seizure. It can be effective but causes a loss of memory for recent events. Sometimes this loss of memory may include the last several months.  Treat all depression or suicide threats as  serious. Obtain professional help. Do not wait to see if serious depression will get better over time without help. Seek help for yourself or those around you. In the [place] the number to the National Suicide Help Lines With 24 Hour Help Are: 1-800-SUICIDE 236-111-2699 Document Released: 06/07/2000 Document Revised: 05/30/2011 Document Reviewed: 01/27/2008 [facility] Patient Information 2012 [place], [place].  Try Witch Hazel Astringent and Cortaid cream to lesions on chest  Try Frozen Krill oil caps daily, MegaRed caps daily by [employer], they also have a good probiotic

## 2012-01-07 NOTE — Assessment & Plan Note (Signed)
Patient is feeling like the Sertraline is not working as well. We should try Escitalopram 10 mg po qd

## 2012-01-07 NOTE — Assessment & Plan Note (Signed)
Improved control today, no changes

## 2012-01-07 NOTE — Progress Notes (Signed)
Patient ID: Mallory [name], female   DOB: 20-Nov-1965, 46 y.o.   MRN: [id number] RHEANNON CERNEY [id number] Oct 03, 1965 01/07/2012      Progress Note-Follow Up  Subjective  Chief Complaint  Chief Complaint  Patient presents with  . Follow-up    2 month    HPI  Patient is a 46 year old Caucasian female who is in for followup. She continues to struggle with depression and anxiety. She has restarted back her sertraline at 50 mg and doesn't feel like it's working as well. She continues to be concerned that it contributes to her weight gain as well. She has not been exercising and watch his: Better diet she does continue to eat the wrong carbs and foods at times. No recent illness, fevers, chills, chest pain, palpitations, shortness of breath, GI or GU complaints noted. She declined statins. No headaches of any note that are concerning for elevated blood pressure.  Past Medical History  Diagnosis Date  . Anxiety   . Depression     no counseling  . GERD (gastroesophageal reflux disease)   . Allergy     year round  . Hyperlipidemia 2002  . IBS (irritable bowel syndrome) 2008    improved over last couple of years using Align  . Interstitial cystitis 2006    Dr Marcine Matar  . Pulmonary nodule 3/24/20101    Incidental finding of CT Chest ordered for chest pain  . Personal history of kidney stones 1986  . Asthma     mild, intermittent, allergy triggered  . Obesity   . Fibromyalgia 1997    dx by rheumatology, no recent flares  . Hiatal hernia   . Nasal lesion 10/24/2010  . PULMONARY NODULE 02/10/2009  . OBESITY 02/10/2009  . NECK PAIN 12/15/2009  . HYPERLIPIDEMIA 02/10/2009  . GERD 02/10/2009  . DEPRESSION 02/10/2009  . Chronic laryngitis 04/26/2009  . ANXIETY 02/10/2009  . ALLERGIC RHINITIS 02/10/2009  . Rash 10/24/2010  . Knee pain, right 12/01/2010  . Pedal edema 02/11/2011  . RUQ pain 02/22/2011  . Endometriosis 03/20/2011  . Headache 04/30/2011  . Fasciculations of muscle 08/07/2011  .  Elevated BP 08/07/2011  . Atypical chest pain 08/07/2011    Past Surgical History  Procedure Date  . Kidney stone surgery 1986  . Dilation and curettage of uterus 2002  . Tonsillectomy   . Polypectomy     uterine    Family History  Problem Relation Age of Onset  . Cancer Mother     breast/ ovarian  . Diabetes Mother   . Hyperlipidemia Mother   . Hypertension Mother   . Cancer Maternal Aunt     colon  . Arthritis Paternal Grandmother   . Heart disease Paternal Grandmother   . Stroke Father 43  . Migraines Sister   . Cancer Maternal Grandmother   . Cancer Maternal Grandfather     History   Social History  . Marital Status: Married    Spouse Name: N/A    Number of Children: 2  . Years of Education: N/A   Occupational History  . unemployed    Social History Main Topics  . Smoking status: Former Smoker    Types: Cigarettes    Quit date: 06/24/1985  . Smokeless tobacco: Never Used   Comment: when a teenager  . Alcohol Use: No  . Drug Use: No  . Sexually Active: Yes -- Female partner(s)   Other Topics Concern  . Not on file   Social History Narrative  Married, has one grown son and one grown daughter.Has 2 grandchildren.Works in [employer]Lives in [place].    Current Outpatient Prescriptions on File Prior to Visit  Medication Sig Dispense Refill  . ALPRAZolam (XANAX) 0.5 MG tablet Take 0.25 mg by mouth at bedtime as needed.      . bifidobacterium infantis (ALIGN) capsule Take 1 capsule by mouth daily.        . Cholecalciferol (VITAMIN D3) 2000 UNITS TABS Take 2,000 Units by mouth daily.      . clidinium-chlordiazePOXIDE (LIBRAX) 2.5-5 MG per capsule Take 1 capsule by mouth 2 (two) times daily. As needed.  60 capsule  0  . fluticasone (FLONASE) 50 MCG/ACT nasal spray Place 2 sprays into the nose daily.  48 g  0  . ibuprofen (ADVIL,MOTRIN) 400 MG tablet Take 1 tablet (400 mg total) by mouth every 8 (eight) hours as needed for pain.  30  tablet  2  . levocetirizine (XYZAL) 5 MG tablet Take 1 tablet (5 mg total) by mouth daily.  90 tablet  1  . omeprazole-sodium bicarbonate (ZEGERID) 40-1100 MG per capsule Take 1 capsule by mouth daily.  90 capsule  1  . promethazine (PHENERGAN) 25 MG tablet Take 1 tablet (25 mg total) by mouth 2 (two) times daily as needed. For nausea and vomitting  40 tablet  1  . ranitidine (ZANTAC) 300 MG tablet Take 1 tablet (300 mg total) by mouth daily.  90 tablet  1  . PROAIR HFA 108 (90 BASE) MCG/ACT inhaler INHALE 1- 2 PUFFS BY MOUTH EVERY 4 -6 HOURS AS NEEDED  8 Inhaler  2    Allergies  Allergen Reactions  . Venlafaxine     REACTION: bad dreams    Review of Systems  Review of Systems  Constitutional: Positive for malaise/fatigue. Negative for fever.  HENT: Negative for congestion.   Eyes: Negative for discharge.  Respiratory: Negative for shortness of breath.   Cardiovascular: Negative for chest pain, palpitations and leg swelling.  Gastrointestinal: Negative for nausea, abdominal pain and diarrhea.  Genitourinary: Negative for dysuria.  Musculoskeletal: Positive for myalgias. Negative for falls.  Skin: Negative for rash.  Neurological: Negative for loss of consciousness and headaches.  Endo/Heme/Allergies: Negative for polydipsia.  Psychiatric/Behavioral: Negative for depression and suicidal ideas. The patient is nervous/anxious and has insomnia.     Objective  BP 126/76  Pulse 81  Temp 97.2 F (36.2 C) (Temporal)  Ht 5' 11.5" (1.816 m)  Wt 264 lb 6.4 oz (119.931 kg)  BMI 36.36 kg/m2  SpO2 97%  Physical Exam  Physical Exam  Constitutional: She is oriented to person, place, and time and well-developed, well-nourished, and in no distress. No distress.  HENT:  Head: Normocephalic and atraumatic.  Eyes: Conjunctivae are normal.  Neck: Neck supple. No thyromegaly present.  Cardiovascular: Normal rate, regular rhythm and normal heart sounds.   No murmur heard. Pulmonary/Chest:  Effort normal and breath sounds normal. She has no wheezes.  Abdominal: She exhibits no distension and no mass.  Musculoskeletal: She exhibits no edema.  Lymphadenopathy:    She has no cervical adenopathy.  Neurological: She is alert and oriented to person, place, and time.  Skin: Skin is warm and dry. No rash noted. She is not diaphoretic.  Psychiatric: Memory, affect and judgment normal.    Lab Results  Component Value Date   TSH 1.41 01/03/2012   Lab Results  Component Value Date   WBC 9.3 01/03/2012   HGB 12.6 01/03/2012  HCT 39.1 01/03/2012   MCV 84.1 01/03/2012   PLT 263.0 01/03/2012   Lab Results  Component Value Date   CREATININE 0.8 01/03/2012   BUN 15 01/03/2012   NA 139 01/03/2012   K 4.4 01/03/2012   CL 103 01/03/2012   CO2 29 01/03/2012   Lab Results  Component Value Date   ALT 24 01/03/2012   AST 21 01/03/2012   ALKPHOS 128* 01/03/2012   BILITOT 0.5 01/03/2012   Lab Results  Component Value Date   CHOL 276* 01/03/2012   Lab Results  Component Value Date   HDL 38.20* 01/03/2012   Lab Results  Component Value Date   LDLCALC 229* 08/06/2011   Lab Results  Component Value Date   TRIG 255.0* 01/03/2012   Lab Results  Component Value Date   CHOLHDL 7 01/03/2012     Assessment & Plan  Elevated BP Improved control today, no changes  OBESITY Patient is very frustrated about weight gain despite her best efforts to cut her calories  Depression with anxiety Patient is feeling like the Sertraline is not working as well. We should try Escitalopram 10 mg po qd  GERD No c/o today, no changes  HYPERLIPIDEMIA Improved total but triglycerides are up. Encouraged her to avoid carbs, use lean proteins, no trans fats and increase exercise add Krill oil caps

## 2012-01-07 NOTE — Assessment & Plan Note (Signed)
No c/o today, no changes

## 2012-01-07 NOTE — Assessment & Plan Note (Signed)
Patient is very frustrated about weight gain despite her best efforts to cut her calories

## 2012-01-08 ENCOUNTER — Ambulatory Visit: Payer: Managed Care, Other (non HMO) | Admitting: Family Medicine

## 2012-01-10 ENCOUNTER — Other Ambulatory Visit: Payer: Self-pay | Admitting: *Deleted

## 2012-01-10 MED ORDER — FLUTICASONE PROPIONATE 50 MCG/ACT NA SUSP: 2.0000 | Freq: Every day | NASAL | Status: DC

## 2012-01-13 ENCOUNTER — Ambulatory Visit: Payer: Managed Care, Other (non HMO) | Admitting: Family Medicine

## 2012-01-16 ENCOUNTER — Telehealth: Payer: Self-pay

## 2012-01-16 NOTE — Telephone Encounter (Signed)
Patient started taking Lexapro on July 20th and on the 22nd the patient states that when she gets into long conversations she gets choked up in her throat? Pt thought that maybe it was the Lexapro but is now wandering if its her acid reflux? Please advise?

## 2012-01-16 NOTE — Telephone Encounter (Signed)
I left a detailed message on patients phone and asked her to return our call if she would like to schedule an appt or if she was having difficulty breathing or swallowing. Patient didn't say she was concerned with either one when she called this morning

## 2012-01-16 NOTE — Telephone Encounter (Signed)
It is more likely her reflux than the Lexapro. Is she having any breathing or swallowing? If not have her try taking 1 Tums with each meal and at bedtime for 2 weeks to see if it helps her symptoms and/or come in to discuss her options

## 2012-02-05 ENCOUNTER — Other Ambulatory Visit: Payer: Self-pay

## 2012-02-05 MED ORDER — ALPRAZOLAM 0.5 MG PO TABS: 0.2500 mg | ORAL_TABLET | Freq: Two times a day (BID) | ORAL | Status: DC | PRN

## 2012-02-05 NOTE — Telephone Encounter (Signed)
Faxed to pharmacy

## 2012-02-05 NOTE — Telephone Encounter (Signed)
Please advise Alprazolam refill?   Fax to [phone]

## 2012-02-28 ENCOUNTER — Telehealth: Payer: Self-pay

## 2012-02-28 ENCOUNTER — Other Ambulatory Visit: Payer: Self-pay

## 2012-02-28 MED ORDER — ACYCLOVIR 5 % EX OINT: TOPICAL_OINTMENT | CUTANEOUS | Status: DC

## 2012-02-28 MED ORDER — LEVOCETIRIZINE DIHYDROCHLORIDE 5 MG PO TABS: 5.0000 mg | ORAL_TABLET | Freq: Every day | ORAL | Status: DC

## 2012-02-28 NOTE — Telephone Encounter (Signed)
Zovirax cream 5% apply small amount of cream to lesion on lips 5 x daily x 4 days, disp 5grm, no rf

## 2012-02-28 NOTE — Telephone Encounter (Signed)
Patient left a message stating that she had a cold sore/fever blister. Pt would like to know if anything could be called in that would work better than OTC Abrevia? Please advise?  Pt would like a call back if something gets sent to pharmacy

## 2012-03-09 ENCOUNTER — Telehealth: Payer: Self-pay

## 2012-03-09 NOTE — Telephone Encounter (Signed)
Pt informed

## 2012-03-09 NOTE — Telephone Encounter (Signed)
Patient called and left a message stating that the roof of her mouth, lips, and tongue feel like that have been "burnt"? Patient states she has a dentist appt on Friday and would like to know if she should have an appt with MD or see what Dentist says?   Per MD have pt take a calcium, magnesium, zinc tablet. And call us if dentist isn't sure what it is or doesn't get any better.  Left a message for pt to return my call.

## 2012-03-11 ENCOUNTER — Telehealth: Payer: Self-pay

## 2012-03-11 NOTE — Telephone Encounter (Signed)
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 Pt stated that the dentist told her this could be from a vitamin B deficiency or due to hormones? Pt did state that she changed her birth control about a month ago to Lo-Lo estrogen? Pt would like to know if she should come in to see Dr Abner Greenspan or call her endocrinologist? Please advise?

## 2012-03-12 NOTE — Telephone Encounter (Signed)
Patient informed and appt scheduled.

## 2012-03-12 NOTE — Telephone Encounter (Signed)
Why doesn't she start here, we can easily check her vitamin B12, do an exam etc, she can also discuss her OCP with her GYN

## 2012-03-13 ENCOUNTER — Encounter: Payer: Self-pay | Admitting: Family Medicine

## 2012-03-13 ENCOUNTER — Ambulatory Visit (INDEPENDENT_AMBULATORY_CARE_PROVIDER_SITE_OTHER): Payer: Managed Care, Other (non HMO) | Admitting: Family Medicine

## 2012-03-13 VITALS — BP 112/71 | HR 83 | Temp 97.6°F | Ht 71.5 in | Wt 259.8 lb

## 2012-03-13 DIAGNOSIS — Z79899 Other long term (current) drug therapy: Secondary | ICD-10-CM

## 2012-03-13 DIAGNOSIS — K144 Atrophy of tongue papillae: Secondary | ICD-10-CM

## 2012-03-13 DIAGNOSIS — R030 Elevated blood-pressure reading, without diagnosis of hypertension: Secondary | ICD-10-CM

## 2012-03-13 DIAGNOSIS — R5383 Other fatigue: Secondary | ICD-10-CM

## 2012-03-13 DIAGNOSIS — K1370 Unspecified lesions of oral mucosa: Secondary | ICD-10-CM

## 2012-03-13 DIAGNOSIS — R5381 Other malaise: Secondary | ICD-10-CM

## 2012-03-13 DIAGNOSIS — K219 Gastro-esophageal reflux disease without esophagitis: Secondary | ICD-10-CM

## 2012-03-13 LAB — RENAL FUNCTION PANEL
Calcium: 9.5 mg/dL (ref 8.4–10.5)
Creatinine, Ser: 1 mg/dL (ref 0.4–1.2)
GFR: 64.78 mL/min (ref >60.00)
Glucose, Bld: 88 mg/dL (ref 70–99)
Phosphorus: 3 mg/dL (ref 2.3–4.6)
Potassium: 4.5 mEq/L (ref 3.5–5.1)
Sodium: 138 mEq/L (ref 135–145)

## 2012-03-13 LAB — CBC
MCHC: 32.6 g/dL (ref 30.0–36.0)
MCV: 83.8 fl (ref 78.0–100.0)
RBC: 4.87 Mil/uL (ref 3.87–5.11)
RDW: 15.2 % — ABNORMAL HIGH (ref 11.5–14.6)

## 2012-03-13 LAB — VITAMIN B12: Vitamin B-12: 346 pg/mL (ref 211–911)

## 2012-03-13 LAB — FOLATE: Folate: 8.3 ng/mL (ref >5.9)

## 2012-03-13 LAB — TSH: TSH: 0.37 u[IU]/mL (ref 0.35–5.50)

## 2012-03-13 MED ORDER — PANTOPRAZOLE SODIUM 40 MG PO TBEC: 40.0000 mg | DELAYED_RELEASE_TABLET | Freq: Two times a day (BID) | ORAL | Status: DC

## 2012-03-13 MED ORDER — SUCRALFATE 1 GM/10ML PO SUSP: 1.0000 g | Freq: Four times a day (QID) | ORAL | Status: DC

## 2012-03-13 NOTE — Progress Notes (Signed)
Patient ID: Mallory [name], female   DOB: 03-Dec-1965, 46 y.o.   MRN: [id number] KESTREL MIS [id number] 1966-03-24 03/13/2012      Progress Note-Follow Up  Subjective  Chief Complaint  Chief Complaint  Patient presents with  . check Vitamin B12 level    dry mouth- feels like mouth burnt  . Injections    flu    HPI  Patient is a 46 rolled Caucasian who is in today concerned about a burning sensation in the tip of her tongue. She says has been present about 3 weeks her tongue feels raw and burning just at the tip. About 2 ago she changed to oral contraceptive but did not notice this right away also about 6 weeks we'll increase her Zegerid to twice a day. No medication changes. She saw her dentist and is told by her dentist that it could be a vitamin B12 deficiency versus hormone issue. She is here today to have lab work checked. Her heartburn continues to flare on her. Even with the Zegerid twice a day and ranitidine at bedtime is a burning in the esophagus as well as in the throat fairly routinely. She does acknowledge if she eats somewhat better with smaller meals and less than foods does improve somewhat. She also is complaining of increased fatigue. No acute signs of illness otherwise. No fevers, chills, chest pain, palpitations, congestion, shortness of breath, GI or GU complaints otherwise noted  Past Medical History  Diagnosis Date  . Anxiety   . Depression     no counseling  . GERD (gastroesophageal reflux disease)   . Allergy     year round  . Hyperlipidemia 2002  . IBS (irritable bowel syndrome) 2008    improved over last couple of years using Align  . Interstitial cystitis 2006    Dr Marcine Matar  . Pulmonary nodule 3/24/20101    Incidental finding of CT Chest ordered for chest pain  . Personal history of kidney stones 1986  . Asthma     mild, intermittent, allergy triggered  . Obesity   . Fibromyalgia 1997    dx by rheumatology, no recent flares  . Hiatal hernia    . Nasal lesion 10/24/2010  . PULMONARY NODULE 02/10/2009  . OBESITY 02/10/2009  . NECK PAIN 12/15/2009  . HYPERLIPIDEMIA 02/10/2009  . GERD 02/10/2009  . DEPRESSION 02/10/2009  . Chronic laryngitis 04/26/2009  . ANXIETY 02/10/2009  . ALLERGIC RHINITIS 02/10/2009  . Rash 10/24/2010  . Knee pain, right 12/01/2010  . Pedal edema 02/11/2011  . RUQ pain 02/22/2011  . Endometriosis 03/20/2011  . Headache 04/30/2011  . Fasciculations of muscle 08/07/2011  . Elevated BP 08/07/2011  . Atypical chest pain 08/07/2011  . 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 Fatigue 03/13/2012    Past Surgical History  Procedure Date  . Kidney stone surgery 1986  . Dilation and curettage of uterus 2002  . Tonsillectomy   . Polypectomy     uterine    Family History  Problem Relation Age of Onset  . Cancer Mother     breast/ ovarian  . Diabetes Mother   . Hyperlipidemia Mother   . Hypertension Mother   . Cancer Maternal Aunt     colon  . Arthritis Paternal Grandmother   . Heart disease Paternal Grandmother   . Stroke Father 18  . Migraines Sister   . Cancer Maternal Grandmother   . Cancer Maternal Grandfather     History   Social History  .  Marital Status: Married    Spouse Name: N/A    Number of Children: 2  . Years of Education: N/A   Occupational History  . unemployed    Social History Main Topics  . Smoking status: Former Smoker    Types: Cigarettes    Quit date: 06/24/1985  . Smokeless tobacco: Never Used   Comment: when a teenager  . Alcohol Use: No  . Drug Use: No  . Sexually Active: Yes -- Female partner(s)   Other Topics Concern  . Not on file   Social History Narrative   Married, has one grown son and one grown daughter.Has 2 grandchildren.Works in [employer]Lives in [place].    Current Outpatient Prescriptions on File Prior to Visit  Medication Sig Dispense Refill  . ALPRAZolam (XANAX) 0.5 MG tablet Take 0.5 tablets (0.25 mg total) by mouth 2 (two) times  daily as needed for sleep or anxiety.  60 tablet  1  . bifidobacterium infantis (ALIGN) capsule Take 1 capsule by mouth daily.        . Cholecalciferol (VITAMIN D3) 2000 UNITS TABS Take 2,000 Units by mouth daily.      . clidinium-chlordiazePOXIDE (LIBRAX) 2.5-5 MG per capsule Take 1 capsule by mouth 2 (two) times daily. As needed.  60 capsule  0  . fluticasone (FLONASE) 50 MCG/ACT nasal spray Place 2 sprays into the nose daily.  48 g  0  . ibuprofen (ADVIL,MOTRIN) 400 MG tablet Take 1 tablet (400 mg total) by mouth every 8 (eight) hours as needed for pain.  30 tablet  2  . levocetirizine (XYZAL) 5 MG tablet Take 1 tablet (5 mg total) by mouth daily.  90 tablet  1  . promethazine (PHENERGAN) 25 MG tablet Take 1 tablet (25 mg total) by mouth 2 (two) times daily as needed. For nausea and vomitting  40 tablet  1  . ranitidine (ZANTAC) 300 MG tablet Take 1 tablet (300 mg total) by mouth daily.  90 tablet  1  . acyclovir ointment (ZOVIRAX) 5 % Apply topically as directed. Apply small amount of cream to lesion on lips 5 X daily X 4 days  5 g  0  . LO LOESTRIN FE 1 MG-10 MCG / 10 MCG tablet       . pantoprazole (PROTONIX) 40 MG tablet Take 1 tablet (40 mg total) by mouth 2 (two) times daily.  60 tablet  3  . PROAIR HFA 108 (90 BASE) MCG/ACT inhaler INHALE 1- 2 PUFFS BY MOUTH EVERY 4 -6 HOURS AS NEEDED  8 Inhaler  2  . sucralfate (CARAFATE) 1 GM/10ML suspension Take 10 mLs (1 g total) by mouth 4 (four) times daily.  420 mL  0    Allergies  Allergen Reactions  . Venlafaxine     REACTION: bad dreams    Review of Systems  Review of Systems  Constitutional: Positive for malaise/fatigue. Negative for fever.  HENT: Negative for congestion.        Tongue is raw and burning on tip of tongue x 3 weeks.  Eyes: Negative for discharge.  Respiratory: Negative for shortness of breath.   Cardiovascular: Negative for chest pain, palpitations and leg swelling.  Gastrointestinal: Positive for heartburn.  Negative for nausea, abdominal pain and diarrhea.       Dyspepsia, burning in throat and esophagus  Genitourinary: Negative for dysuria.  Musculoskeletal: Negative for falls.  Skin: Negative for rash.  Neurological: Negative for loss of consciousness and headaches.  Endo/Heme/Allergies: Negative  for polydipsia.  Psychiatric/Behavioral: Negative for depression and suicidal ideas. The patient is not nervous/anxious and does not have insomnia.     Objective  BP 112/71  Pulse 83  Temp 97.6 F (36.4 C) (Temporal)  Ht 5' 11.5" (1.816 m)  Wt 259 lb 12.8 oz (117.845 kg)  BMI 35.73 kg/m2  SpO2 97%  Physical Exam  Physical Exam  Constitutional: She is oriented to person, place, and time and well-developed, well-nourished, and in no distress. No distress.  HENT:  Head: Normocephalic and atraumatic.       Mild erythema in posterior oropharynx, tip of tongue increased erythema and denuded of papillae  Eyes: Conjunctivae normal are normal.  Neck: Neck supple. No thyromegaly present.  Cardiovascular: Normal rate, regular rhythm and normal heart sounds.   No murmur heard. Pulmonary/Chest: Effort normal and breath sounds normal. She has no wheezes.  Abdominal: Soft. Bowel sounds are normal. She exhibits no distension and no mass. There is no tenderness.  Musculoskeletal: She exhibits no edema.  Lymphadenopathy:    She has no cervical adenopathy.  Neurological: She is alert and oriented to person, place, and time.  Skin: Skin is warm and dry. No rash noted. She is not diaphoretic.  Psychiatric: Memory, affect and judgment normal.    Lab Results  Component Value Date   TSH 1.41 01/03/2012   Lab Results  Component Value Date   WBC 9.3 01/03/2012   HGB 12.6 01/03/2012   HCT 39.1 01/03/2012   MCV 84.1 01/03/2012   PLT 263.0 01/03/2012   Lab Results  Component Value Date   CREATININE 0.8 01/03/2012   BUN 15 01/03/2012   NA 139 01/03/2012   K 4.4 01/03/2012   CL 103 01/03/2012   CO2 29  01/03/2012   Lab Results  Component Value Date   ALT 24 01/03/2012   AST 21 01/03/2012   ALKPHOS 128* 01/03/2012   BILITOT 0.5 01/03/2012   Lab Results  Component Value Date   CHOL 276* 01/03/2012   Lab Results  Component Value Date   HDL 38.20* 01/03/2012   Lab Results  Component Value Date   LDLCALC 229* 08/06/2011   Lab Results  Component Value Date   TRIG 255.0* 01/03/2012   Lab Results  Component Value Date   CHOLHDL 7 01/03/2012     Assessment & Plan  Tongue denuded of papillae Burning on tip of tongue for roughly 3 weeks. Could be numerous things, vitamin deficiency, Zegerid, worsening reflux, OCPs, etc, switch to Protonix 40 mg bid for 1 mn then decrease to qd, check labs and consider further adjustments as needed. Given Carafate to use prn  GERD Try swithcing form Zegerid to Protonix bid and continue Ranitidine qhs  Fatigue Check tsh, cbc, renal  Elevated BP Good control now

## 2012-03-13 NOTE — Assessment & Plan Note (Signed)
Good control now.  

## 2012-03-13 NOTE — Assessment & Plan Note (Signed)
Check tsh, cbc, renal

## 2012-03-13 NOTE — Patient Instructions (Addendum)

## 2012-03-13 NOTE — Assessment & Plan Note (Signed)
Try swithcing form Zegerid to Protonix bid and continue Ranitidine qhs

## 2012-03-13 NOTE — Assessment & Plan Note (Signed)
Burning on tip of tongue for roughly 3 weeks. Could be numerous things, vitamin deficiency, Zegerid, worsening reflux, OCPs, etc, switch to Protonix 40 mg bid for 1 mn then decrease to qd, check labs and consider further adjustments as needed. Given Carafate to use prn

## 2012-03-16 LAB — INTRINSIC FACTOR ANTIBODIES: Intrinsic Factor: NEGATIVE

## 2012-03-23 ENCOUNTER — Other Ambulatory Visit: Payer: Self-pay

## 2012-03-23 MED ORDER — FLUTICASONE PROPIONATE 50 MCG/ACT NA SUSP: 2.0000 | Freq: Every day | NASAL | Status: DC

## 2012-03-24 ENCOUNTER — Other Ambulatory Visit: Payer: Self-pay

## 2012-03-24 MED ORDER — FLUTICASONE PROPIONATE 50 MCG/ACT NA SUSP: 2.0000 | Freq: Every day | NASAL | Status: DC

## 2012-04-08 ENCOUNTER — Ambulatory Visit: Payer: Managed Care, Other (non HMO) | Admitting: Family Medicine

## 2012-04-29 ENCOUNTER — Telehealth: Payer: Self-pay

## 2012-04-29 MED ORDER — ACYCLOVIR 400 MG PO TABS: 400.0000 mg | ORAL_TABLET | Freq: Every day | ORAL | Status: DC

## 2012-04-29 NOTE — Telephone Encounter (Signed)
She can have Acyclovir 400 mg tab 1 tab po 5 x daily x 4 days, disp #20

## 2012-04-29 NOTE — Telephone Encounter (Signed)
Pt left a message stating that she has been sick with a cold and has developed a cold sore/ fever blister. Pt states the prescription cream doesn't seem to be helping. Pt stated she though she took a prescription pill before for this and would like to know if there is any such thing and if so can something be called into the pharmacy? Please advise?

## 2012-04-29 NOTE — Telephone Encounter (Signed)
Patient informed and RX sent to pharmacy 

## 2012-05-01 ENCOUNTER — Other Ambulatory Visit: Payer: Self-pay

## 2012-05-01 DIAGNOSIS — Z79899 Other long term (current) drug therapy: Secondary | ICD-10-CM

## 2012-05-01 DIAGNOSIS — K1370 Unspecified lesions of oral mucosa: Secondary | ICD-10-CM

## 2012-05-01 MED ORDER — PANTOPRAZOLE SODIUM 40 MG PO TBEC: 40.0000 mg | DELAYED_RELEASE_TABLET | Freq: Two times a day (BID) | ORAL | Status: DC

## 2012-05-05 ENCOUNTER — Telehealth: Payer: Self-pay

## 2012-05-05 NOTE — Telephone Encounter (Signed)
Pt would like to know when she should come in for her labwork? Pt stated on voicemail that she wants her Cholesterol, Sugar (physical labs) done

## 2012-05-05 NOTE — Telephone Encounter (Signed)
So January would be the 6 month mark since the last blood draw. If she wants to do it sooner we can do it any time

## 2012-05-06 NOTE — Telephone Encounter (Signed)
Left a detailed message on patients voicemail.

## 2012-05-11 ENCOUNTER — Telehealth: Payer: Self-pay

## 2012-05-11 NOTE — Telephone Encounter (Signed)
Left a detailed message on patients cell phone

## 2012-05-11 NOTE — Telephone Encounter (Signed)
Left a detailed message on patients voicemail.

## 2012-05-11 NOTE — Telephone Encounter (Signed)
Patient left a message stating she is having the burning in her mouth again. Pt stated that the Protonix seemed to work after a few days but it has been going on this time since the middle of last week? Pt would like to know if MD wants to change the Protonix or is there anything else she can do? Please advise?

## 2012-05-11 NOTE — Telephone Encounter (Signed)
We gave her Carafate in past too. Have her restart that 4 ties a day for next week and come in if no improvement

## 2012-05-11 NOTE — Telephone Encounter (Signed)
Pt left a message stating she tried the carafate once on Sat and once on Sun. Pt stated that she swished and swallowed (not sure if shes suppose to swallow) and got diarrhea a couple hours later both days? Pts not sure if the Carafate caused this or just coincidence? Pt would like to know if she should continue this or not? Please advise

## 2012-05-11 NOTE — Telephone Encounter (Signed)
So if it caused diarrhea and her lesions are in her mouth then she should just swish and spit the Carafate qid

## 2012-05-13 ENCOUNTER — Ambulatory Visit: Payer: Managed Care, Other (non HMO) | Admitting: Family Medicine

## 2012-05-18 ENCOUNTER — Telehealth: Payer: Self-pay

## 2012-05-18 MED ORDER — TOBRAMYCIN-DEXAMETHASONE 0.3-0.1 % OP SUSP: 1.0000 [drp] | OPHTHALMIC | Status: DC

## 2012-05-18 MED ORDER — SUCRALFATE 1 G PO TABS: 1.0000 g | ORAL_TABLET | Freq: Four times a day (QID) | ORAL | Status: DC | PRN

## 2012-05-18 NOTE — Telephone Encounter (Signed)
Patient left a message stating that the Carafate is working well and would like to know if a tablet form could be called in instead of liquid? Patient also stated that Friday she woke up and her eyes were matted, red, and watering. Pt stated everything went away except for the eye watering and would like to know if TobraDex could be called into pharmacy?  Please advise?

## 2012-05-18 NOTE — Telephone Encounter (Signed)
RX's sent to pharmacy and pt informed and voiced understanding

## 2012-05-18 NOTE — Telephone Encounter (Signed)
She can switch to Carafate tabs, #1 tab po qid prn pain, reflux, disp #120 2 refill. Apply moist warm compresses to eyes tid and I assume she has used Tobradex in past. 1 drop every 4 hours to eyes prn conjunctivitis x 5 days, now if this is viral this will not help, she may want to try the warm compresses for a couple days before she moves on to drops. Come in if no improvement

## 2012-06-18 ENCOUNTER — Telehealth: Payer: Self-pay

## 2012-06-18 NOTE — Telephone Encounter (Signed)
Do it is up to her, if she is not taking the lipitor and is really going to change her diet she can wait 2-3 months to check it.

## 2012-06-18 NOTE — Telephone Encounter (Signed)
Patient left a message stating that she is supposed to come in for labs and is wandering why she needs for these to be done? Pt states she is not taking the Lipitor anymore and is going to start following a strict diet of taking fish oil and fiber and wants to know if she can wait awhile to do her labs? Please advise?

## 2012-06-19 NOTE — Telephone Encounter (Signed)
Left a detailed message on patients voicemail.

## 2012-06-24 ENCOUNTER — Other Ambulatory Visit: Payer: Self-pay | Admitting: Family Medicine

## 2012-06-25 ENCOUNTER — Telehealth: Payer: Self-pay

## 2012-06-25 NOTE — Telephone Encounter (Signed)
Left a detailed message on patients cell phone

## 2012-06-25 NOTE — Telephone Encounter (Signed)
Try switching to MgaRed krill oil caps one daily from fish ad see if that helps. To stop Zoloft take 25 mg daily for 30 days then stop

## 2012-06-25 NOTE — Telephone Encounter (Signed)
Pt states she takes 1200 mg of fish oil and it really upsets her stomach. She has tried the intarcoated and putting them in the freezer and is having the same results with stomach hurting. Pt is wanting to know if maybe reducing the mg will help?  Pt is also wanting to go off of her Zoloft 50 mg? Pt would like to know what reduction she should take? Please advise?

## 2012-06-30 ENCOUNTER — Other Ambulatory Visit: Payer: Managed Care, Other (non HMO)

## 2012-07-03 ENCOUNTER — Ambulatory Visit: Payer: Managed Care, Other (non HMO) | Admitting: Family Medicine

## 2012-07-19 ENCOUNTER — Emergency Department
Admission: EM | Admit: 2012-07-19 | Discharge: 2012-07-19 | Disposition: A | Discharge status: Home / Self Care | Payer: Managed Care, Other (non HMO) | Source: Home / Self Care | Attending: Family Medicine | Admitting: Family Medicine

## 2012-07-19 ENCOUNTER — Encounter: Payer: Self-pay | Admitting: *Deleted

## 2012-07-19 DIAGNOSIS — J069 Acute upper respiratory infection, unspecified: Secondary | ICD-10-CM

## 2012-07-19 DIAGNOSIS — R05 Cough: Secondary | ICD-10-CM

## 2012-07-19 MED ORDER — METHYLPREDNISOLONE ACETATE 80 MG/ML IJ SUSP
80.0000 mg | Freq: Once | INTRAMUSCULAR | Status: AC
  Administered 2012-07-19: 80 mg via INTRAMUSCULAR

## 2012-07-19 MED ORDER — AZITHROMYCIN 250 MG PO TABS: ORAL_TABLET | ORAL | Status: DC

## 2012-07-19 NOTE — ED Notes (Signed)
Pt c/o headache, cough, and larygitis started 4 days ago. Pt tried OTC Sudafed, Benadryl, Delsym with little relief.

## 2012-07-19 NOTE — ED Provider Notes (Signed)
History     CSN: [id number]  Arrival date & time 07/19/12  1153   First MD Initiated Contact with Patient 07/19/12 1320      Chief Complaint  Patient presents with  . Headache  . Cough  . Hoarse   HPI  URI Symptoms Onset: 4-5 days  Description: rhinorrhea, nasal congestion, sinus drainage, sore throat, cough  Modifying factors:  none  Symptoms Nasal discharge: yes Fever: no Sore throat: yes Cough: yes Wheezing: no Ear pain: no GI symptoms: no Sick contacts: yes  Red Flags  Stiff neck: no Dyspnea: n Rash: no Swallowing difficulty: no  Sinusitis Risk Factors Headache/face pain: no Double sickening: no tooth pain: no  Allergy Risk Factors Sneezing: no Itchy scratchy throat: no Seasonal symptoms: yes  Flu Risk Factors Headache: no muscle aches: no severe fatigue: no   Past Medical History  Diagnosis Date  . Anxiety   . Depression     no counseling  . GERD (gastroesophageal reflux disease)   . Allergy     year round  . Hyperlipidemia 2002  . IBS (irritable bowel syndrome) 2008    improved over last couple of years using Align  . Interstitial cystitis 2006    Dr Marcine Matar  . Pulmonary nodule 3/24/20101    Incidental finding of CT Chest ordered for chest pain  . Personal history of kidney stones 1986  . Asthma     mild, intermittent, allergy triggered  . Obesity   . Fibromyalgia 1997    dx by rheumatology, no recent flares  . Hiatal hernia   . Nasal lesion 10/24/2010  . PULMONARY NODULE 02/10/2009  . OBESITY 02/10/2009  . NECK PAIN 12/15/2009  . HYPERLIPIDEMIA 02/10/2009  . GERD 02/10/2009  . DEPRESSION 02/10/2009  . Chronic laryngitis 04/26/2009  . ANXIETY 02/10/2009  . ALLERGIC RHINITIS 02/10/2009  . Rash 10/24/2010  . Knee pain, right 12/01/2010  . Pedal edema 02/11/2011  . RUQ pain 02/22/2011  . Endometriosis 03/20/2011  . Headache 04/30/2011  . Fasciculations of muscle 08/07/2011  . Elevated BP 08/07/2011  . Atypical chest pain 08/07/2011    . 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 Fatigue 03/13/2012    Past Surgical History  Procedure Date  . Kidney stone surgery 1986  . Dilation and curettage of uterus 2002  . Tonsillectomy   . Polypectomy     uterine    Family History  Problem Relation Age of Onset  . Cancer Mother     breast/ ovarian  . Diabetes Mother   . Hyperlipidemia Mother   . Hypertension Mother   . Cancer Maternal Aunt     colon  . Arthritis Paternal Grandmother   . Heart disease Paternal Grandmother   . Stroke Father 47  . Migraines Sister   . Cancer Maternal Grandmother   . Cancer Maternal Grandfather     History  Substance Use Topics  . Smoking status: Former Smoker    Types: Cigarettes    Quit date: 06/24/1985  . Smokeless tobacco: Never Used     Comment: when a teenager  . Alcohol Use: No    OB History    Grav Para Term Preterm Abortions TAB SAB Ect Mult Living                  Review of Systems  All other systems reviewed and are negative.    Allergies  Venlafaxine  Home Medications   Current Outpatient Rx  Name  Route  Sig  Dispense  Refill  . ACYCLOVIR 400 MG PO TABS   Oral   Take 1 tablet (400 mg total) by mouth 5 (five) times daily.   20 tablet   0   . ACYCLOVIR 5 % EX OINT   Topical   Apply topically as directed. Apply small amount of cream to lesion on lips 5 X daily X 4 days   5 g   0   . ALPRAZOLAM 0.5 MG PO TABS   Oral   Take 0.5 tablets (0.25 mg total) by mouth 2 (two) times daily as needed for sleep or anxiety.   60 tablet   1   . AZITHROMYCIN 250 MG PO TABS      Take 2 tabs PO x 1 dose, then 1 tab PO QD x 4 days   6 tablet   0   . ALIGN PO CAPS   Oral   Take 1 capsule by mouth daily.           Marland Kitchen VITAMIN D3 2000 UNITS PO TABS   Oral   Take 2,000 Units by mouth daily.         Marland Kitchen CLINDINIUM-CHLORDIAZEPOXIDE 2.5-5 MG PO CAPS   Oral   Take 1 capsule by mouth 2 (two) times daily. As needed.   60 capsule   0   . FLUTICASONE  PROPIONATE 50 MCG/ACT NA SUSP   Nasal   Place 2 sprays into the nose daily.   48 g   2   . IBUPROFEN 400 MG PO TABS   Oral   Take 1 tablet (400 mg total) by mouth every 8 (eight) hours as needed for pain.   30 tablet   2   . LEVOCETIRIZINE DIHYDROCHLORIDE 5 MG PO TABS   Oral   Take 1 tablet (5 mg total) by mouth daily.   90 tablet   1   . LO LOESTRIN FE 1 MG-10 MCG / 10 MCG PO TABS               . PANTOPRAZOLE SODIUM 40 MG PO TBEC   Oral   Take 1 tablet (40 mg total) by mouth 2 (two) times daily.   180 tablet   1   . PROAIR HFA 108 (90 BASE) MCG/ACT IN AERS      INHALE 1- 2 PUFFS BY MOUTH EVERY 4 -6 HOURS AS NEEDED   8 Inhaler   2   . PROMETHAZINE HCL 25 MG PO TABS   Oral   Take 1 tablet (25 mg total) by mouth 2 (two) times daily as needed. For nausea and vomitting   40 tablet   1   . RANITIDINE HCL 300 MG PO TABS   Oral   Take 1 tablet (300 mg total) by mouth daily.   90 tablet   1   . SERTRALINE HCL 50 MG PO TABS   Oral   Take 50 mg by mouth daily.         . SERTRALINE HCL 50 MG PO TABS      TAKE 1 & 1/2 TABLETS BY MOUTH EVERY DAY   135 tablet   1   . SUCRALFATE 1 G PO TABS   Oral   Take 1 tablet (1 g total) by mouth 4 (four) times daily as needed.   120 tablet   2   . SUCRALFATE 1 GM/10ML PO SUSP   Oral   Take 10 mLs (1 g total) by mouth 4 (  four) times daily.   420 mL   0   . TOBRAMYCIN-DEXAMETHASONE 0.3-0.1 % OP SUSP   Both Eyes   Place 1 drop into both eyes every 4 (four) hours while awake. X 5 days   5 mL   0     BP 148/91  Pulse 95  Temp 97.7 F (36.5 C) (Oral)  Resp 16  Ht 6' (1.829 m)  Wt 263 lb 8 oz (119.523 kg)  BMI 35.74 kg/m2  SpO2 96%  Physical Exam  Constitutional: She appears well-developed and well-nourished.  HENT:  Head: Normocephalic and atraumatic.  Right Ear: External ear normal.  Left Ear: External ear normal.       +nasal erythema, rhinorrhea bilaterally, + post oropharyngeal erythema    Eyes:  Pupils are equal, round, and reactive to light.  Neck: Normal range of motion.  Cardiovascular: Normal rate, regular rhythm and normal heart sounds.   Pulmonary/Chest: Effort normal and breath sounds normal.  Abdominal: Soft.  Musculoskeletal: Normal range of motion.  Neurological: She is alert.  Skin: Skin is warm.    ED Course  Procedures (including critical care time)  Labs Reviewed - No data to display No results found.   1. URI (upper respiratory infection)       MDM  Suspect viral URI  Will pace on zpak for atypical coverage.  Discussed supportive care and infectious/resp red flags.  Follow up as needed.     The patient and/or caregiver has been counseled thoroughly with regard to treatment plan and/or medications prescribed including dosage, schedule, interactions, rationale for use, and possible side effects and they verbalize understanding. Diagnoses and expected course of recovery discussed and will return if not improved as expected or if the condition worsens. Patient and/or caregiver verbalized understanding.             Doree Albee, MD 07/21/12 1054

## 2012-07-20 ENCOUNTER — Telehealth: Payer: Self-pay | Admitting: *Deleted

## 2012-07-20 NOTE — Telephone Encounter (Signed)
I am willing to let her have some Tussionex but she would have to pick it up. 1 tsp po qhs prn cough, disp #140, no rf

## 2012-07-20 NOTE — Telephone Encounter (Signed)
Received message from pt that she was seen in urgent care yesterday and diagnosed with viral infection and allergies. Pt was given cortisone injection and zpack. Pt states she is still coughing which is worse at bedtime and she is not sleeping well. Pt is requesting something be called in as Delsym is no longer helping.  Please advise.

## 2012-07-21 MED ORDER — HYDROCOD POLST-CHLORPHEN POLST 10-8 MG/5ML PO LQCR: 5.0000 mL | Freq: Every day | ORAL | Status: DC

## 2012-07-21 NOTE — Telephone Encounter (Signed)
Pt informed and states her husband will come by to pick it up

## 2012-07-22 ENCOUNTER — Telehealth: Payer: Self-pay

## 2012-07-22 NOTE — Telephone Encounter (Signed)
Patient informed and voiced and understanding

## 2012-07-22 NOTE — Telephone Encounter (Signed)
So saline nasal flushes 3 x a day. Hydrate well, Mucinex and Zyrtec bid but I agree Sudafed is not a great option for her, it will also increase insomnia and anxiety

## 2012-07-22 NOTE — Telephone Encounter (Signed)
Pt left a message stating that she would like to know what she can take for sinus congestion? Pt states her head and ears are full and the doctor at Urgent Care told her not to take Sudafed because that might dry her out to much?  Please advise?

## 2012-08-11 ENCOUNTER — Other Ambulatory Visit: Payer: Self-pay | Admitting: Family Medicine

## 2012-08-12 NOTE — Telephone Encounter (Signed)
Please advise refill? Last RX was wrote on 02-05-12 quantity 60 with 1 refill?  Please advise?

## 2012-08-12 NOTE — Telephone Encounter (Signed)
RX faxed

## 2012-08-23 ENCOUNTER — Other Ambulatory Visit: Payer: Self-pay | Admitting: Family Medicine

## 2012-08-24 ENCOUNTER — Other Ambulatory Visit: Payer: Self-pay | Admitting: Family Medicine

## 2012-09-10 ENCOUNTER — Encounter: Payer: Self-pay | Admitting: Hematology

## 2012-09-11 ENCOUNTER — Encounter: Payer: Self-pay | Admitting: Obstetrics and Gynecology

## 2012-09-11 ENCOUNTER — Ambulatory Visit (INDEPENDENT_AMBULATORY_CARE_PROVIDER_SITE_OTHER): Payer: Managed Care, Other (non HMO) | Admitting: Obstetrics and Gynecology

## 2012-09-11 VITALS — BP 136/78 | Ht 71.0 in | Wt 261.0 lb

## 2012-09-11 DIAGNOSIS — N393 Stress incontinence (female) (male): Secondary | ICD-10-CM

## 2012-09-11 DIAGNOSIS — E669 Obesity, unspecified: Secondary | ICD-10-CM

## 2012-09-11 DIAGNOSIS — R8281 Pyuria: Secondary | ICD-10-CM

## 2012-09-11 DIAGNOSIS — Z8041 Family history of malignant neoplasm of ovary: Secondary | ICD-10-CM

## 2012-09-11 DIAGNOSIS — Z803 Family history of malignant neoplasm of breast: Secondary | ICD-10-CM

## 2012-09-11 DIAGNOSIS — R82998 Other abnormal findings in urine: Secondary | ICD-10-CM

## 2012-09-11 DIAGNOSIS — Z01419 Encounter for gynecological examination (general) (routine) without abnormal findings: Secondary | ICD-10-CM

## 2012-09-11 DIAGNOSIS — Z23 Encounter for immunization: Secondary | ICD-10-CM

## 2012-09-11 LAB — POCT URINALYSIS DIPSTICK

## 2012-09-11 MED ORDER — NORETHIN-ETH ESTRAD-FE BIPHAS 1 MG-10 MCG / 10 MCG PO TABS: 1.0000 | ORAL_TABLET | Freq: Every day | ORAL | Status: DC

## 2012-09-11 NOTE — Addendum Note (Signed)
Addended by: Conley Simmonds on: 09/11/2012 05:04 PM   Modules accepted: Orders
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
## 2012-09-11 NOTE — Patient Instructions (Signed)
EXERCISE AND DIET:  We recommended that you start or continue a regular exercise program for good health. Regular exercise means any activity that makes your heart beat faster and makes you sweat.  We recommend exercising at least 30 minutes per day at least 3 days a week, preferably 4 or 5.  We also recommend a diet low in fat and sugar.  Inactivity, poor dietary choices and obesity can cause diabetes, heart attack, stroke, and kidney damage, among others.    ALCOHOL AND SMOKING:  Women should limit their alcohol intake to no more than 7 drinks/beers/glasses of wine (combined, not each!) per week. Moderation of alcohol intake to this level decreases your risk of breast cancer and liver damage. And of course, no recreational drugs are part of a healthy lifestyle.  And absolutely no smoking or even second hand smoke. Most people know smoking can cause heart and lung diseases, but did you know it also contributes to weakening of your bones? Aging of your skin?  Yellowing of your teeth and nails?  CALCIUM AND VITAMIN D:  Adequate intake of calcium and Vitamin D are recommended.  The recommendations for exact amounts of these supplements seem to change often, but generally speaking 600 mg of calcium (either carbonate or citrate) and 800 units of Vitamin D per day seems prudent. Certain women may benefit from higher intake of Vitamin D.  If you are among these women, your doctor will have told you during your visit.    PAP SMEARS:  Pap smears, to check for cervical cancer or precancers,  have traditionally been done yearly, although recent scientific advances have shown that most women can have pap smears less often.  However, every woman still should have a physical exam from her gynecologist every year. It will include a breast check, inspection of the vulva and vagina to check for abnormal growths or skin changes, a visual exam of the cervix, and then an exam to evaluate the size and shape of the uterus and  ovaries.  And after 47 years of age, a rectal exam is indicated to check for rectal cancers. We will also provide age appropriate advice regarding health maintenance, like when you should have certain vaccines, screening for sexually transmitted diseases, bone density testing, colonoscopy, mammograms, etc.   MAMMOGRAMS:  All women over 40 years old should have a yearly mammogram. Many facilities now offer a "3D" mammogram, which may cost around $50 extra out of pocket. If possible,  we recommend you accept the option to have the 3D mammogram performed.  It both reduces the number of women who will be called back for extra views which then turn out to be normal, and it is better than the routine mammogram at detecting truly abnormal areas.    COLONOSCOPY:  Colonoscopy to screen for colon cancer is recommended for all women at age 50.  We know, you hate the idea of the prep.  We agree, BUT, having colon cancer and not knowing it is worse!!  Colon cancer so often starts as a polyp that can be seen and removed at colonscopy, which can quite literally save your life!  And if your first colonoscopy is normal and you have no family history of colon cancer, most women don't have to have it again for 10 years.  Once every ten years, you can do something that may end up saving your life, right?  We will be happy to help you get it scheduled when you are ready.    Be sure to check your insurance coverage so you understand how much it will cost.  It may be covered as a preventative service at no cost, but you should check your particular policy.    Obesity Obesity is defined as having too much total body fat and a body mass index (BMI) of 30 or more. BMI is an estimate of body fat and is calculated from your height and weight. Obesity happens when you consume more calories than you can burn by exercising or performing daily physical tasks. Prolonged obesity can cause major illnesses or emergencies, such as:   A  stroke.  Heart disease.  Diabetes.  Cancer.  Arthritis.  High blood pressure (hypertension).  High cholesterol.  Sleep apnea.  Erectile dysfunction.  Infertility problems. CAUSES   Regularly eating unhealthy foods.  Physical inactivity.  Certain disorders, such as an underactive thyroid (hypothyroidism), Cushing's syndrome, and polycystic ovarian syndrome.  Certain medicines, such as steroids, some depression medicines, and antipsychotics.  Genetics.  Lack of sleep. DIAGNOSIS  A caregiver can diagnose obesity after calculating your BMI. Obesity will be diagnosed if your BMI is 30 or higher.  There are other methods of measuring obesity levels. Some other methods include measuring your skin fold thickness, your waist circumference, and comparing your hip circumference to your waist circumference. TREATMENT  A healthy treatment program includes some or all of the following:  Long-term dietary changes.  Exercise and physical activity.  Behavioral and lifestyle changes.  Medicine only under the supervision of your caregiver. Medicines may help, but only if they are used with diet and exercise programs. An unhealthy treatment program includes:  Fasting.  Fad diets.  Supplements and drugs. These choices do not succeed in long-term weight control.  HOME CARE INSTRUCTIONS   Exercise and perform physical activity as directed by your caregiver. To increase physical activity, try the following:  Use stairs instead of elevators.  Park farther away from store entrances.  Garden, bike, or walk instead of watching television or using the computer.  Eat healthy, low-calorie foods and drinks on a regular basis. Eat more fruits and vegetables. Use low-calorie cookbooks or take healthy cooking classes.  Limit fast food, sweets, and processed snack foods.  Eat smaller portions.  Keep a daily journal of everything you eat. There are many free websites to help you with  this. It may be helpful to measure your foods so you can determine if you are eating the correct portion sizes.  Avoid drinking alcohol. Drink more water and drinks without calories.  Take vitamins and supplements only as recommended by your caregiver.  Weight-loss support groups, Registered Dieticians, counselors, and stress reduction education can also be very helpful. SEEK IMMEDIATE MEDICAL CARE IF:  You have chest pain or tightness.  You have trouble breathing or feel short of breath.  You have weakness or leg numbness.  You feel confused or have trouble talking.  You have sudden changes in your vision. MAKE SURE YOU:  Understand these instructions.  Will watch your condition.  Will get help right away if you are not doing well or get worse. Document Released: 07/18/2004 Document Revised: 12/10/2011 Document Reviewed: 07/17/2011 Exit[employer] Patient Information 2013 [employer].  Exercise to Lose Weight Exercise and a healthy diet may help you lose weight. Your doctor may suggest specific exercises. EXERCISE IDEAS AND TIPS  Choose low-cost things you enjoy doing, such as walking, bicycling, or exercising to workout videos.  Take stairs instead of the elevator.  Walk   during your lunch break.  Park your car further away from work or school.  Go to a gym or an exercise class.  Start with 5 to 10 minutes of exercise each day. Build up to 30 minutes of exercise 4 to 6 days a week.  Wear shoes with good support and comfortable clothes.  Stretch before and after working out.  Work out until you breathe harder and your heart beats faster.  Drink extra water when you exercise.  Do not do so much that you hurt yourself, feel dizzy, or get very short of breath. Exercises that burn about 150 calories:  Running 1  miles in 15 minutes.  Playing volleyball for 45 to 60 minutes.  Washing and waxing a car for 45 to 60 minutes.  Playing touch football for 45  minutes.  Walking 1  miles in 35 minutes.  Pushing a stroller 1  miles in 30 minutes.  Playing basketball for 30 minutes.  Raking leaves for 30 minutes.  Bicycling 5 miles in 30 minutes.  Walking 2 miles in 30 minutes.  Dancing for 30 minutes.  Shoveling snow for 15 minutes.  Swimming laps for 20 minutes.  Walking up stairs for 15 minutes.  Bicycling 4 miles in 15 minutes.  Gardening for 30 to 45 minutes.  Jumping rope for 15 minutes.  Washing windows or floors for 45 to 60 minutes. Document Released: 07/13/2010 Document Revised: 09/02/2011 Document Reviewed: 07/13/2010 Exit[employer] Patient Information 2013 [employer].   

## 2012-09-14 ENCOUNTER — Other Ambulatory Visit: Payer: Self-pay | Admitting: Obstetrics and Gynecology

## 2012-09-14 ENCOUNTER — Telehealth: Payer: Self-pay | Admitting: Genetic Counselor

## 2012-09-14 DIAGNOSIS — F411 Generalized anxiety disorder: Secondary | ICD-10-CM

## 2012-09-14 DIAGNOSIS — E669 Obesity, unspecified: Secondary | ICD-10-CM

## 2012-09-14 DIAGNOSIS — Z809 Family history of malignant neoplasm, unspecified: Secondary | ICD-10-CM

## 2012-09-14 NOTE — Telephone Encounter (Signed)
LVOM FOR PT TO RETURN CALL IN RE TO GENETIC APPT.  °

## 2012-09-15 ENCOUNTER — Telehealth: Payer: Self-pay | Admitting: *Deleted

## 2012-09-15 NOTE — Telephone Encounter (Signed)
Left message for patient to return call for date and time of appt. With Dr. Jadene Pierini 2:31pm

## 2012-09-15 NOTE — Telephone Encounter (Signed)
LEFT MESSAGE ON HOME CB# OF NEED TO CALL OFFICE WITH DATE AND TIME OF APPT. WITH DR. Vernona Rieger, PHYCOLOGIST MARCH 11th @ 11am. INVALID WORK #.

## 2012-09-15 NOTE — Telephone Encounter (Signed)
SPOKE WITH TIFFANY AT [facility] IN REGARDS TO REFERRAL. PATIENT WAS NOTIFIED BY TIFFANY OF APPT. FOR GENETIC COUNCELING. [facility] WANTS TO FIND OUT OF COST ON H ER PART FOR THIS AND TIFFANY STATED WILL RESEARCH THIS FOR THE PATIENT AND CALL PATIENT WITH THIS INFORMATION OF COST. 11:10am / SUE

## 2012-09-16 ENCOUNTER — Telehealth: Payer: Self-pay | Admitting: *Deleted

## 2012-09-16 LAB — CULTURE, URINE COMPREHENSIVE: Colony Count: 10000

## 2012-09-16 NOTE — Telephone Encounter (Signed)
Patient is aware of appt. With Dr. Cyndia Skeeters but has called and will reschedule that for next month with Dr. Cyndia Skeeters. sue

## 2012-09-24 ENCOUNTER — Telehealth: Payer: Self-pay | Admitting: Obstetrics and Gynecology

## 2012-09-24 NOTE — Telephone Encounter (Deleted)
VM has name confirmation

## 2012-09-24 NOTE — Telephone Encounter (Signed)
PT CALLING TO GET HER RESULTS FROM HER LAST VISIT

## 2012-09-24 NOTE — Telephone Encounter (Signed)
VM has name confirmation. Per note on urine culture, Marchelle Folks notified patient that results were normal on 09-14-12.  [email or web address]

## 2012-09-25 ENCOUNTER — Other Ambulatory Visit (INDEPENDENT_AMBULATORY_CARE_PROVIDER_SITE_OTHER): Payer: Managed Care, Other (non HMO)

## 2012-09-25 ENCOUNTER — Encounter: Payer: Self-pay | Admitting: Obstetrics and Gynecology

## 2012-09-25 DIAGNOSIS — E785 Hyperlipidemia, unspecified: Secondary | ICD-10-CM

## 2012-09-25 DIAGNOSIS — R030 Elevated blood-pressure reading, without diagnosis of hypertension: Secondary | ICD-10-CM

## 2012-09-25 LAB — CBC
HCT: 38.6 % (ref 36.0–46.0)
Hemoglobin: 12.9 g/dL (ref 12.0–15.0)
MCV: 81.9 fl (ref 78.0–100.0)
RBC: 4.71 Mil/uL (ref 3.87–5.11)
WBC: 8.7 10*3/uL (ref 4.5–10.5)

## 2012-09-25 NOTE — Progress Notes (Signed)
Labs only

## 2012-09-28 LAB — LIPID PANEL
Cholesterol: 278 mg/dL — ABNORMAL HIGH (ref 0–200)
HDL: 35.5 mg/dL — ABNORMAL LOW (ref >39.00)
VLDL: 39.6 mg/dL (ref 0.0–40.0)

## 2012-09-28 LAB — HEPATIC FUNCTION PANEL
Albumin: 3.7 g/dL (ref 3.5–5.2)
Total Bilirubin: 0.4 mg/dL (ref 0.3–1.2)

## 2012-09-28 LAB — RENAL FUNCTION PANEL
CO2: 24 mEq/L (ref 19–32)
Chloride: 104 mEq/L (ref 96–112)
Creatinine, Ser: 0.8 mg/dL (ref 0.4–1.2)
GFR: 80.52 mL/min (ref >60.00)
Phosphorus: 3.3 mg/dL (ref 2.3–4.6)
Sodium: 136 mEq/L (ref 135–145)

## 2012-09-28 LAB — LDL CHOLESTEROL, DIRECT: Direct LDL: 213.6 mg/dL

## 2012-09-29 ENCOUNTER — Encounter: Payer: Self-pay | Admitting: Family Medicine

## 2012-09-29 ENCOUNTER — Ambulatory Visit (INDEPENDENT_AMBULATORY_CARE_PROVIDER_SITE_OTHER): Payer: Managed Care, Other (non HMO) | Admitting: Family Medicine

## 2012-09-29 VITALS — BP 124/74 | HR 76 | Ht 71.5 in | Wt 262.0 lb

## 2012-09-29 DIAGNOSIS — E785 Hyperlipidemia, unspecified: Secondary | ICD-10-CM

## 2012-09-29 DIAGNOSIS — R42 Dizziness and giddiness: Secondary | ICD-10-CM

## 2012-09-29 MED ORDER — SERTRALINE HCL 100 MG PO TABS: ORAL_TABLET | ORAL | Status: DC

## 2012-09-29 NOTE — Progress Notes (Signed)
OFFICE NOTE  10/04/2012  CC:  Chief Complaint  Patient presents with  . Dizziness     HPI: Patient is a 47 y.o. Caucasian female who is here for discussion of recent labs + dizzy and weak. Complains of 2-3 wks of having brief spells (2-3 seconds) of feeling lightheaded or disequilibrium, feeling fatigued more lately, brief ringing in ears off and on, occ strange sharp pains in head randomly.  Denies vision complaint, palpitations, SOB, or presyncope.  Increased work stress lately, she says she internalizes is, worry begets worry, etc.  I reviewed her recent labs in detail with her: all normal except her cholesterol panel, which showed all parameters pretty far away from goal cutoffs.  She doesn't exercise and is currently not dieting.  Pertinent PMH:  Past Medical History  Diagnosis Date  . Anxiety   . Depression     no counseling  . GERD (gastroesophageal reflux disease)   . Allergy     year round  . Hyperlipidemia 2002  . IBS (irritable bowel syndrome) 2008    improved over last couple of years using Align  . Interstitial cystitis 2006    Dr Marcine Matar  . Pulmonary nodule 3/24/20101    Incidental finding of CT Chest ordered for chest pain  . Personal history of kidney stones 1986  . Asthma     mild, intermittent, allergy triggered  . Obesity   . Fibromyalgia 1997    dx by rheumatology, no recent flares  . Hiatal hernia   . Nasal lesion 10/24/2010  . OBESITY 02/10/2009  . NECK PAIN 12/15/2009  . GERD 02/10/2009  . DEPRESSION 02/10/2009  . Chronic laryngitis 04/26/2009  . ANXIETY 02/10/2009  . ALLERGIC RHINITIS 02/10/2009  . Knee pain, right 12/01/2010  . Pedal edema 02/11/2011  . Endometriosis 03/20/2011  . Headache 04/30/2011  . Fasciculations of muscle 08/07/2011  . Elevated BP 08/07/2011  . Atypical chest pain 08/07/2011  . Fatigue 03/13/2012    MEDS:  Outpatient Prescriptions Prior to Visit  Medication Sig Dispense Refill  . acyclovir (ZOVIRAX) 400 MG tablet Take  400 mg by mouth as needed.      . ALPRAZolam (XANAX) 0.5 MG tablet TAKE 1/2 TABLET TWICE DAILY AS NEEDED  60 tablet  0  . bifidobacterium infantis (ALIGN) capsule Take 1 capsule by mouth daily.        . Cholecalciferol (VITAMIN D3) 2000 UNITS TABS Take 2,000 Units by mouth daily.      . clidinium-chlordiazePOXIDE (LIBRAX) 2.5-5 MG per capsule Take 1 capsule by mouth 2 (two) times daily. As needed.  60 capsule  0  . fluticasone (FLONASE) 50 MCG/ACT nasal spray Place 2 sprays into the nose daily.  48 g  2  . ibuprofen (ADVIL,MOTRIN) 400 MG tablet Take 1 tablet (400 mg total) by mouth every 8 (eight) hours as needed for pain.  30 tablet  2  . levocetirizine (XYZAL) 5 MG tablet TAKE 1 TABLET BY MOUTH EVERY DAY  90 tablet  1  . Norethindrone-Ethinyl Estradiol-Fe Biphas (LO LOESTRIN FE) 1 MG-10 MCG / 10 MCG tablet Take 1 tablet by mouth daily.  1 Package  11  . Omega-3 Fatty Acids (FISH OIL PO) Take by mouth.      . pantoprazole (PROTONIX) 40 MG tablet Take 1 tablet (40 mg total) by mouth 2 (two) times daily.  180 tablet  1  . PROAIR HFA 108 (90 BASE) MCG/ACT inhaler INHALE 1- 2 PUFFS BY MOUTH EVERY 4 -6 HOURS  AS NEEDED  8 Inhaler  2  . promethazine (PHENERGAN) 25 MG tablet Take 1 tablet (25 mg total) by mouth 2 (two) times daily as needed. For nausea and vomitting  40 tablet  1  . ranitidine (ZANTAC) 300 MG tablet Take 1 tablet (300 mg total) by mouth daily.  90 tablet  1  . sucralfate (CARAFATE) 1 G tablet Take 1 tablet (1 g total) by mouth 4 (four) times daily as needed.  120 tablet  2  . sertraline (ZOLOFT) 50 MG tablet TAKE 1 & 1/2 TABLETS BY MOUTH EVERY DAY  135 tablet  1   No facility-administered medications prior to visit.    PE: Blood pressure 124/74, pulse 76, height 5' 11.5" (1.816 m), weight 262 lb (118.842 kg), last menstrual period 12/29/2011. Gen: Alert, well appearing.  Patient is oriented to person, place, time, and situation. ENT: Ears: EACs clear, normal epithelium.  TMs with  good light reflex and landmarks bilaterally.  Eyes: no injection, icteris, swelling, or exudate.  EOMI, PERRLA. Nose: no drainage or turbinate edema/swelling.  No injection or focal lesion.  Mouth: lips without lesion/swelling.  Oral mucosa pink and moist.  Dentition intact and without obvious caries or gingival swelling.  Oropharynx without erythema, exudate, or swelling.  Neck - No masses or thyromegaly or limitation in range of motion CV: RRR, no m/r/g.   LUNGS: CTA bilat, nonlabored resps, good aeration in all lung fields. EXT: no clubbing, cyanosis, or edema.  Neuro: CN 2-12 intact bilaterally, strength 5/5 in proximal and distal upper extremities and lower extremities bilaterally.  No sensory deficits.  No tremor.  No disdiadochokinesis.  No ataxia.  Upper extremity and lower extremity DTRs symmetric.  No pronator drift.   IMPRESSION AND PLAN:  Disequilibrium Suspect benign etiology, no red flags for signif cardio or neuro etiology. Anxiety playing a huge role in her health and overall well-being. Discussed this today and agreed to increase zoloft to 100mg  qd x 2wks, then increase to 150 mg qd after that.   Therapeutic expectations and side effect profile of medication discussed today.  Patient's questions answered.   HYPERLIPIDEMIA Discussed dx, TLC is the only plan at this time but she'll likely need statin.  Will discuss this possibility more when some of her anxiety/stress is better.  Her goal right now is to pick ONE exercise and start doing it--try to get in a routine by next time I see her in 1 mo.   An After Visit Summary was printed and given to the patient.  Spent 25 min with pt today, with >50% of this time spent in counseling and care coordination regarding the above problems.  FOLLOW UP: 83mo

## 2012-10-03 ENCOUNTER — Other Ambulatory Visit: Payer: Self-pay | Admitting: Family Medicine

## 2012-10-04 ENCOUNTER — Encounter: Payer: Self-pay | Admitting: Family Medicine

## 2012-10-04 NOTE — Assessment & Plan Note (Signed)
Discussed dx, TLC is the only plan at this time but she'll likely need statin.  Will discuss this possibility more when some of her anxiety/stress is better.  Her goal right now is to pick ONE exercise and start doing it--try to get in a routine by next time I see her in 1 mo.

## 2012-10-04 NOTE — Assessment & Plan Note (Signed)
Suspect benign etiology, no red flags for signif cardio or neuro etiology. Anxiety playing a huge role in her health and overall well-being. Discussed this today and agreed to increase zoloft to 100mg  qd x 2wks, then increase to 150 mg qd after that.   Therapeutic expectations and side effect profile of medication discussed today.  Patient's questions answered.

## 2012-10-05 NOTE — Telephone Encounter (Signed)
eScribe request for refill on LIBRAX Last filled - 12/05/11, #60 X 0 (Dr. Abner Greenspan) Last seen on - 09/29/12 Follow up - 10/27/12 Please advise refills.

## 2012-10-06 ENCOUNTER — Ambulatory Visit (HOSPITAL_BASED_OUTPATIENT_CLINIC_OR_DEPARTMENT_OTHER)
Admission: RE | Admit: 2012-10-06 | Discharge: 2012-10-06 | Disposition: A | Discharge status: Home / Self Care | Payer: Managed Care, Other (non HMO) | Source: Ambulatory Visit | Attending: Family Medicine | Admitting: Family Medicine

## 2012-10-06 ENCOUNTER — Encounter: Payer: Self-pay | Admitting: Family Medicine

## 2012-10-06 ENCOUNTER — Other Ambulatory Visit (HOSPITAL_BASED_OUTPATIENT_CLINIC_OR_DEPARTMENT_OTHER): Payer: Managed Care, Other (non HMO)

## 2012-10-06 ENCOUNTER — Ambulatory Visit (INDEPENDENT_AMBULATORY_CARE_PROVIDER_SITE_OTHER): Payer: Managed Care, Other (non HMO) | Admitting: Family Medicine

## 2012-10-06 VITALS — BP 140/74 | HR 76 | Temp 97.6°F | Ht 71.5 in | Wt 261.0 lb

## 2012-10-06 DIAGNOSIS — K8020 Calculus of gallbladder without cholecystitis without obstruction: Secondary | ICD-10-CM

## 2012-10-06 DIAGNOSIS — R109 Unspecified abdominal pain: Secondary | ICD-10-CM

## 2012-10-06 DIAGNOSIS — R1011 Right upper quadrant pain: Secondary | ICD-10-CM | POA: Insufficient documentation

## 2012-10-06 LAB — CBC WITH DIFFERENTIAL/PLATELET
Basophils Absolute: 0 10*3/uL (ref 0.0–0.1)
Eosinophils Absolute: 0.2 10*3/uL (ref 0.0–0.7)
HCT: 41.3 % (ref 36.0–46.0)
Hemoglobin: 13.8 g/dL (ref 12.0–15.0)
Lymphs Abs: 2.9 10*3/uL (ref 0.7–4.0)
MCHC: 33.4 g/dL (ref 30.0–36.0)
MCV: 82.2 fl (ref 78.0–100.0)
Monocytes Absolute: 0.5 10*3/uL (ref 0.1–1.0)
Monocytes Relative: 5.2 % (ref 3.0–12.0)
Neutro Abs: 5.7 10*3/uL (ref 1.4–7.7)
Platelets: 347 10*3/uL (ref 150.0–400.0)
RDW: 15.6 % — ABNORMAL HIGH (ref 11.5–14.6)

## 2012-10-06 LAB — H. PYLORI ANTIBODY, IGG: H Pylori IgG: NEGATIVE

## 2012-10-06 LAB — LIPASE: Lipase: 32 U/L (ref 11.0–59.0)

## 2012-10-06 MED ORDER — SUCRALFATE 1 GM/10ML PO SUSP: 1.0000 g | Freq: Four times a day (QID) | ORAL | Status: DC

## 2012-10-06 NOTE — Addendum Note (Signed)
Addended by: Jeoffrey Massed on: 10/06/2012 08:31 PM   Modules accepted: Orders

## 2012-10-06 NOTE — Progress Notes (Signed)
OFFICE NOTE  10/06/2012  CC:  Chief Complaint  Patient presents with  . Abdominal Pain    bloating x 2 weeks, hx of IBS     HPI: Patient is a 47 y.o. Caucasian female who is here for abd pain and bloating.   Last couple weeks, bloating, nawing feeling in abdomen, then yesterday after eating 1-2 bites of chicken sandwich felt epigastric pain/fullness, radiated into her back, lasted 1-2 hours.  Minimal nausea.  Has been having this type of postprandial pain lately but just not quite as severe or prolonged.  The bloating has been constant.  Hasn't felt much significant GERD sx's lately. Worse sitting up, better lying supine.   She continues with "random" diarrhea that she usually has with her IBS but she says the pain she is having and the bloating is not her usual IBS sx's.  Last few months has had some right scapular pain.  No fever. Not taking any NSAIDs.  Pertinent PMH:  Past Medical History  Diagnosis Date  . Anxiety   . Depression     no counseling  . GERD (gastroesophageal reflux disease)   . Allergy     year round  . Hyperlipidemia 2002  . IBS (irritable bowel syndrome) 2008    improved over last couple of years using Align  . Interstitial cystitis 2006    Dr Marcine Matar  . Pulmonary nodule 3/24/20101    Incidental finding of CT Chest ordered for chest pain  . Personal history of kidney stones 1986  . Asthma     mild, intermittent, allergy triggered  . Obesity   . Fibromyalgia 1997    dx by rheumatology, no recent flares  . Hiatal hernia   . Nasal lesion 10/24/2010  . OBESITY 02/10/2009  . NECK PAIN 12/15/2009  . GERD 02/10/2009  . DEPRESSION 02/10/2009  . Chronic laryngitis 04/26/2009  . ANXIETY 02/10/2009  . ALLERGIC RHINITIS 02/10/2009  . Knee pain, right 12/01/2010  . Pedal edema 02/11/2011  . Endometriosis 03/20/2011  . Headache 04/30/2011  . Fasciculations of muscle 08/07/2011  . Elevated BP 08/07/2011  . Atypical chest pain 08/07/2011  . Fatigue 03/13/2012     MEDS:  Outpatient Prescriptions Prior to Visit  Medication Sig Dispense Refill  . acyclovir (ZOVIRAX) 400 MG tablet Take 400 mg by mouth as needed.      . ALPRAZolam (XANAX) 0.5 MG tablet TAKE 1/2 TABLET TWICE DAILY AS NEEDED  60 tablet  0  . bifidobacterium infantis (ALIGN) capsule Take 1 capsule by mouth daily.        . Cholecalciferol (VITAMIN D3) 2000 UNITS TABS Take 2,000 Units by mouth daily.      . clidinium-chlordiazePOXIDE (LIBRAX) 2.5-5 MG per capsule TAKE ONE CAPSULE BY MOUTH TWICE A DAY AS NEEDED  60 capsule  0  . fluticasone (FLONASE) 50 MCG/ACT nasal spray Place 2 sprays into the nose daily.  48 g  2  . ibuprofen (ADVIL,MOTRIN) 400 MG tablet Take 1 tablet (400 mg total) by mouth every 8 (eight) hours as needed for pain.  30 tablet  2  . levocetirizine (XYZAL) 5 MG tablet TAKE 1 TABLET BY MOUTH EVERY DAY  90 tablet  1  . Norethindrone-Ethinyl Estradiol-Fe Biphas (LO LOESTRIN FE) 1 MG-10 MCG / 10 MCG tablet Take 1 tablet by mouth daily.  1 Package  11  . Omega-3 Fatty Acids (FISH OIL PO) Take by mouth.      . pantoprazole (PROTONIX) 40 MG tablet Take 1  tablet (40 mg total) by mouth 2 (two) times daily.  180 tablet  1  . PROAIR HFA 108 (90 BASE) MCG/ACT inhaler INHALE 1- 2 PUFFS BY MOUTH EVERY 4 -6 HOURS AS NEEDED  8 Inhaler  2  . promethazine (PHENERGAN) 25 MG tablet Take 1 tablet (25 mg total) by mouth 2 (two) times daily as needed. For nausea and vomitting  40 tablet  1  . sertraline (ZOLOFT) 100 MG tablet 1 tab po qd x 14d, then increase to 1 and 1/2 tabs po qd  45 tablet  1  . sucralfate (CARAFATE) 1 G tablet Take 1 tablet (1 g total) by mouth 4 (four) times daily as needed.  120 tablet  2  . ranitidine (ZANTAC) 300 MG tablet Take 1 tablet (300 mg total) by mouth daily.  90 tablet  1   No facility-administered medications prior to visit.    PE: Blood pressure 140/74, pulse 76, temperature 97.6 F (36.4 C), temperature source Temporal, height 5' 11.5" (1.816 m), weight  261 lb (118.389 kg), last menstrual period 12/29/2011. Gen: Alert, well appearing.  Patient is oriented to person, place, time, and situation. AFFECT: pleasant, lucid thought and speech. ENT:  Eyes: no injection, icteris, swelling, or exudate.  EOMI, PERRLA. Nose: no drainage or turbinate edema/swelling.  No injection or focal lesion.  Mouth: lips without lesion/swelling.  Oral mucosa pink and moist.  Dentition intact and without obvious caries or gingival swelling.  Oropharynx without erythema, exudate, or swelling.  Neck - No masses or thyromegaly or limitation in range of motion CV: RRR, no m/r/g.   LUNGS: CTA bilat, nonlabored resps, good aeration in all lung fields. ABD: soft, NT, ND, BS normal.  No hepatospenomegaly or mass.  No bruits. EXT: no clubbing, cyanosis, or edema.   LAB:    Chemistry      Component Value Date/Time   NA 136 09/25/2012 0851   K 4.0 09/25/2012 0851   CL 104 09/25/2012 0851   CO2 24 09/25/2012 0851   BUN 13 09/25/2012 0851   CREATININE 0.8 09/25/2012 0851   CREATININE 0.89 08/06/2011 1645      Component Value Date/Time   CALCIUM 9.1 09/25/2012 0851   ALKPHOS 127* 09/25/2012 0851   AST 14 09/25/2012 0851   ALT 13 09/25/2012 0851   BILITOT 0.4 09/25/2012 0851     Lab Results  Component Value Date   WBC 8.7 09/25/2012   HGB 12.9 09/25/2012   HCT 38.6 09/25/2012   MCV 81.9 09/25/2012   PLT 290.0 09/25/2012   Lab Results  Component Value Date   TSH 1.40 09/25/2012     IMPRESSION AND PLAN:  Recurrent abdominal pain Cholelithiasis possible, needs to be ruled out.  Gastritis/dyspepsia also a distinct possibility. Check CBC and H. Pylori and lipase. Recent normal CMET 09/25/12 noted. Abd u/s ordered and will be done at 3:30 this afternoon. Carafate 10mg  qAC and qhs rx'd today.  She is already on protonix 40mg  bid.  If u/s neg for gallstones, will add zantac 300 qhs short term and continue carafate and see how she does over the next couple of weeks.     An After Visit Summary  was printed and given to the patient.  FOLLOW UP: 2 wks

## 2012-10-06 NOTE — Assessment & Plan Note (Signed)
Cholelithiasis possible, needs to be ruled out.  Gastritis/dyspepsia also a distinct possibility. Check CBC and H. Pylori and lipase. Recent normal CMET 09/25/12 noted. Abd u/s ordered and will be done at 3:30 this afternoon. Carafate 10mg  qAC and qhs rx'd today.  She is already on protonix 40mg  bid.  If u/s neg for gallstones, will add zantac 300 qhs short term and continue carafate and see how she does over the next couple of weeks.

## 2012-10-07 ENCOUNTER — Ambulatory Visit: Payer: Managed Care, Other (non HMO)

## 2012-10-07 ENCOUNTER — Encounter: Payer: Self-pay | Admitting: *Deleted

## 2012-10-07 ENCOUNTER — Encounter: Payer: Self-pay | Admitting: Emergency Medicine

## 2012-10-07 DIAGNOSIS — K8020 Calculus of gallbladder without cholecystitis without obstruction: Secondary | ICD-10-CM

## 2012-10-07 LAB — COMPREHENSIVE METABOLIC PANEL
ALT: 13 U/L (ref 0–35)
Alkaline Phosphatase: 122 U/L — ABNORMAL HIGH (ref 39–117)
Glucose, Bld: 83 mg/dL (ref 70–99)
Sodium: 137 mEq/L (ref 135–145)
Total Bilirubin: 0.5 mg/dL (ref 0.3–1.2)
Total Protein: 7.3 g/dL (ref 6.0–8.3)

## 2012-10-08 ENCOUNTER — Telehealth: Payer: Self-pay | Admitting: *Deleted

## 2012-10-08 ENCOUNTER — Other Ambulatory Visit: Payer: Self-pay | Admitting: Family Medicine

## 2012-10-08 NOTE — Telephone Encounter (Signed)
Pt called she is having stomach bloating and no appetite.  She wonders if this is related to gall bladder or GERD.  Wants to know if taking Zantac is OK and if doubling her zoloft could cause these symptoms.  Stopped Carafate, continues with Phenergan or protonix as directed yesterday.

## 2012-10-08 NOTE — Telephone Encounter (Signed)
Phenergan request [Last Rx 04.15.2013 #40x1]/SLS Please advise.

## 2012-10-08 NOTE — Telephone Encounter (Signed)
I think this is gallbladder-related.   Would continue protonix as per her regular dosing and phenergan as needed.  Keep zoloft the same and she doesn't need to add zantac or carafate.  Does she have gen surg appt?-thx

## 2012-10-08 NOTE — Telephone Encounter (Signed)
Noted  

## 2012-10-08 NOTE — Telephone Encounter (Signed)
Pt aware.  Yes, she has appt on 10/19/12.

## 2012-10-12 ENCOUNTER — Encounter: Payer: Self-pay | Admitting: *Deleted

## 2012-10-13 ENCOUNTER — Telehealth: Payer: Self-pay | Admitting: *Deleted

## 2012-10-13 NOTE — Telephone Encounter (Signed)
Patient informed, understood & agreed; her Sxs are stable/SLS

## 2012-10-13 NOTE — Telephone Encounter (Signed)
Rx request DENIED--Too soon for request [Last Rx 04.15.14 #420 mLx0 RF]/SLS

## 2012-10-13 NOTE — Telephone Encounter (Signed)
Patient states she was reading on the Web/Internet about symptoms she is having: fatigue & yellow stools & would like to know if these are normal for her gallbladder issue.?/SLS Please advise.

## 2012-10-13 NOTE — Telephone Encounter (Signed)
Yes, related to gallstones.   As long as her symptoms are the same/stable as when I last saw her (last week) then nothing further is needed until she goes to see her surgeon on 10/19/12.  If she feels like she is getting worse then have her come back in for a recheck with me.--thx

## 2012-10-19 ENCOUNTER — Telehealth (INDEPENDENT_AMBULATORY_CARE_PROVIDER_SITE_OTHER): Payer: Self-pay

## 2012-10-19 ENCOUNTER — Ambulatory Visit (INDEPENDENT_AMBULATORY_CARE_PROVIDER_SITE_OTHER): Payer: Managed Care, Other (non HMO) | Admitting: General Surgery

## 2012-10-19 ENCOUNTER — Encounter (INDEPENDENT_AMBULATORY_CARE_PROVIDER_SITE_OTHER): Payer: Self-pay | Admitting: General Surgery

## 2012-10-19 VITALS — BP 118/82 | HR 76 | Temp 97.9°F | Resp 18 | Ht 72.0 in | Wt 257.6 lb

## 2012-10-19 DIAGNOSIS — K8010 Calculus of gallbladder with chronic cholecystitis without obstruction: Secondary | ICD-10-CM

## 2012-10-19 NOTE — Telephone Encounter (Signed)
Correction: appt 11/20/12 at 11:15am.

## 2012-10-19 NOTE — Addendum Note (Signed)
Addended by: Almond Lint on: 10/19/2012 10:49 AM   Modules accepted: Orders

## 2012-10-19 NOTE — Patient Instructions (Signed)

## 2012-10-19 NOTE — Assessment & Plan Note (Signed)
Patient is a 47 year old female who appears to have chronic cholecystitis based on the tenderness that she is having all the time and the continuous symptoms.  She has already changed to a low-fat diet and I do not think she would benefit from attempting to wait to have her gallbladder out.   The surgical procedure was described to the patient in detail.  The patient was given educational material.  I discussed the incision type and location, the location of the gallbladder, the anatomy of the bile ducts and arteries, and the typical progression of surgery.  I discussed the possibility of converting to an open operation.  I advised of the risks of bleeding, infection, damage to other structures (such as the bile duct, intestine or liver), bile leak, need for other procedures or surgeries, and post op diarrhea/constipation.   We discussed the recovery period and post operative restrictions.  The patient was advised against taking blood thinners the week before surgery.

## 2012-10-19 NOTE — Progress Notes (Addendum)
Chief Complaint  Patient presents with  . New Evaluation    eval chole w/ abd pain    HISTORY: Agents 47 year old female who presents with a severe episode of right upper quadrant/ epigastric pain that occurred 2 weeks ago.  She describes it like a punch to the stomach.  The pain lasted around 2 hours.  It was associated with nausea, and she has continued to have nausea since she had the pain.  She does not recall what she ate that day, but she usually has a latte from [employer] and a bacon breakfast sandwich in the morning.  She has a history of IBS, and thought the diarrhea she was having was from that.  It has seemed to be a little worse with dairy.  She denies fever/chills.  She denies jaundice or dark urine, but she did have yellow stools.  Her mother had gallbladder disease.    Past Medical History  Diagnosis Date  . Anxiety   . Depression     no counseling  . GERD (gastroesophageal reflux disease)   . Allergy     year round  . Hyperlipidemia 2002  . IBS (irritable bowel syndrome) 2008    improved over last couple of years using Align  . Interstitial cystitis 2006    Dr Marcine Matar  . Pulmonary nodule 3/24/20101    Incidental finding of CT Chest ordered for chest pain  . Personal history of kidney stones 1986  . Asthma     mild, intermittent, allergy triggered  . Obesity   . Fibromyalgia 1997    dx by rheumatology, no recent flares  . Hiatal hernia   . Nasal lesion 10/24/2010  . OBESITY 02/10/2009  . NECK PAIN 12/15/2009  . GERD 02/10/2009  . DEPRESSION 02/10/2009  . Chronic laryngitis 04/26/2009  . ANXIETY 02/10/2009  . ALLERGIC RHINITIS 02/10/2009  . Knee pain, right 12/01/2010  . Pedal edema 02/11/2011  . Endometriosis 03/20/2011  . Headache 04/30/2011  . Fasciculations of muscle 08/07/2011  . Elevated BP 08/07/2011  . Atypical chest pain 08/07/2011  . Fatigue 03/13/2012    Past Surgical History  Procedure Laterality Date  . Dilation and curettage of uterus  2002  .  Tonsillectomy    . Polypectomy      uterine    Current Outpatient Prescriptions  Medication Sig Dispense Refill  . ALPRAZolam (XANAX) 0.5 MG tablet TAKE 1/2 TABLET TWICE DAILY AS NEEDED  60 tablet  0  . bifidobacterium infantis (ALIGN) capsule Take 1 capsule by mouth daily.        . Cholecalciferol (VITAMIN D3) 2000 UNITS TABS Take 2,000 Units by mouth daily.      . fluticasone (FLONASE) 50 MCG/ACT nasal spray Place 2 sprays into the nose daily.  48 g  2  . levocetirizine (XYZAL) 5 MG tablet TAKE 1 TABLET BY MOUTH EVERY DAY  90 tablet  1  . Norethindrone-Ethinyl Estradiol-Fe Biphas (LO LOESTRIN FE) 1 MG-10 MCG / 10 MCG tablet Take 1 tablet by mouth daily.  1 Package  11  . pantoprazole (PROTONIX) 40 MG tablet Take 1 tablet (40 mg total) by mouth 2 (two) times daily.  180 tablet  1  . PROAIR HFA 108 (90 BASE) MCG/ACT inhaler INHALE 1- 2 PUFFS BY MOUTH EVERY 4 -6 HOURS AS NEEDED  8 Inhaler  2  . promethazine (PHENERGAN) 25 MG tablet TAKE 1 TABLET (25 MG TOTAL) BY MOUTH 2 (TWO) TIMES DAILY AS NEEDED. FOR NAUSEA AND VOMITTING  60 tablet  1  . sertraline (ZOLOFT) 100 MG tablet 1 tab po qd x 14d, then increase to 1 and 1/2 tabs po qd  45 tablet  1   No current facility-administered medications for this visit.     Allergies  Allergen Reactions  . Venlafaxine     REACTION: bad dreams     Family History  Problem Relation Age of Onset  . Diabetes Mother   . Hyperlipidemia Mother   . Hypertension Mother   . Breast cancer Mother   . Cancer Mother     breast/ ovarian  . Cancer Maternal Aunt     colon  . Arthritis Paternal Grandmother   . Heart disease Paternal Grandmother   . Stroke Father 12  . Migraines Sister   . Cancer Maternal Grandmother   . Cancer Maternal Grandfather      History   Social History  . Marital Status: Married    Spouse Name: N/A    Number of Children: 2  . Years of Education: N/A   Occupational History  . unemployed    Social History Main Topics  .  Smoking status: Former Smoker    Types: Cigarettes    Quit date: 06/24/1985  . Smokeless tobacco: Never Used     Comment: when a teenager  . Alcohol Use: No  . Drug Use: No  . Sexually Active: Yes -- Female partner(s)   Other Topics Concern  . None   Social History Narrative   Married, has one grown son and one grown daughter.   Has 2 grandchildren.   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     REVIEW OF SYSTEMS - PERTINENT POSITIVES ONLY: 12 point review of systems negative other than HPI and PMH except for asthma, bronchitis, easy bruising, sinus issues, joint pain.    EXAM: Filed Vitals:   10/19/12 0857  BP: 118/82  Pulse: 76  Temp: 97.9 F (36.6 C)  Resp: 18    Gen:  No acute distress.  Well nourished and well groomed.   Neurological: Alert and oriented to person, place, and time. Coordination normal.  Head: Normocephalic and atraumatic.  Eyes: Conjunctivae are normal. Pupils are equal, round, and reactive to light. No scleral icterus.  Neck: Normal range of motion. Neck supple. No tracheal deviation or thyromegaly present.  Cardiovascular: Normal rate, regular rhythm, normal heart sounds and intact distal pulses.  Exam reveals no gallop and no friction rub.  No murmur heard. Respiratory: Effort normal.  No respiratory distress. No chest wall tenderness. Breath sounds normal.  No wheezes, rales or rhonchi.  GI: Soft. Bowel sounds are normal. The abdomen is soft .  There is no hepatosplenomegaly.  There is tenderness in the RUQ over the gallbladder.  There is no rebound and no guarding.  Musculoskeletal: Normal range of motion. Extremities are nontender.  Lymphadenopathy: No cervical, preauricular, postauricular or axillary adenopathy is present Skin: Skin is warm and dry. No rash noted. No diaphoresis. No erythema. No pallor. No clubbing, cyanosis, or edema.   Psychiatric: Normal mood and affect. Behavior is normal. Judgment and thought content  normal.    LABORATORY RESULTS: Available labs are reviewed  CMET, CBC essentially normal except for Alk phos 132  RADIOLOGY RESULTS: See E-Chart or I-Site for most recent results.  Images and reports are reviewed. Ultrasound IMPRESSION:  1. Cholelithiasis and borderline to mild gallbladder wall  thickening, but no sonographic Murphy's sign elicited to strongly  suggest acute  cholecystitis. Clinical correlation recommended.  2. No biliary ductal dilatation and otherwise negative abdominal  ultrasound.   ASSESSMENT AND PLAN: Chronic cholecystitis with calculus Patient is a 47 year old female who appears to have chronic cholecystitis based on the tenderness that she is having all the time and the continuous symptoms.  She has already changed to a low-fat diet and I do not think she would benefit from attempting to wait to have her gallbladder out.   The surgical procedure was described to the patient in detail.  The patient was given educational material.  I discussed the incision type and location, the location of the gallbladder, the anatomy of the bile ducts and arteries, and the typical progression of surgery.  I discussed the possibility of converting to an open operation.  I advised of the risks of bleeding, infection, damage to other structures (such as the bile duct, intestine or liver), bile leak, need for other procedures or surgeries, and post op diarrhea/constipation.   We discussed the recovery period and post operative restrictions.  The patient was advised against taking blood thinners the week before surgery.          Maudry Diego MD Surgical Oncology, General and Endocrine Surgery [facility]      Visit Diagnoses: 1. Chronic cholecystitis with calculus     Primary Care Physician: Jeoffrey Massed, MD

## 2012-10-19 NOTE — Telephone Encounter (Signed)
LMOV pt's po appt with Dr. Donell Beers 11/20/29 at 3:30pm.

## 2012-10-20 ENCOUNTER — Encounter (HOSPITAL_COMMUNITY): Payer: Self-pay | Admitting: Pharmacy Technician

## 2012-10-20 ENCOUNTER — Telehealth: Payer: Self-pay | Admitting: *Deleted

## 2012-10-20 NOTE — Telephone Encounter (Addendum)
Patient left voice mail requesting copy of most recent labs be sent to her. Copy of labs mailed to her home address.

## 2012-10-21 ENCOUNTER — Ambulatory Visit: Payer: Managed Care, Other (non HMO) | Admitting: Family Medicine

## 2012-10-22 ENCOUNTER — Encounter (HOSPITAL_COMMUNITY)
Admission: RE | Admit: 2012-10-22 | Discharge: 2012-10-22 | Disposition: A | Discharge status: Home / Self Care | Payer: Managed Care, Other (non HMO) | Source: Ambulatory Visit | Attending: General Surgery | Admitting: General Surgery

## 2012-10-22 ENCOUNTER — Ambulatory Visit (HOSPITAL_COMMUNITY)
Admission: RE | Admit: 2012-10-22 | Discharge: 2012-10-22 | Disposition: A | Discharge status: Home / Self Care | Payer: Managed Care, Other (non HMO) | Source: Ambulatory Visit | Attending: General Surgery | Admitting: General Surgery

## 2012-10-22 ENCOUNTER — Encounter (HOSPITAL_COMMUNITY): Payer: Self-pay

## 2012-10-22 DIAGNOSIS — R911 Solitary pulmonary nodule: Secondary | ICD-10-CM | POA: Insufficient documentation

## 2012-10-22 DIAGNOSIS — K8010 Calculus of gallbladder with chronic cholecystitis without obstruction: Secondary | ICD-10-CM | POA: Insufficient documentation

## 2012-10-22 DIAGNOSIS — Z01818 Encounter for other preprocedural examination: Secondary | ICD-10-CM | POA: Insufficient documentation

## 2012-10-22 HISTORY — DX: Unspecified osteoarthritis, unspecified site: M19.90

## 2012-10-22 LAB — HCG, SERUM, QUALITATIVE: Preg, Serum: NEGATIVE

## 2012-10-22 NOTE — Patient Instructions (Addendum)
YOUR SURGERY IS SCHEDULED AT [facility]  ON:  Wednesday  5/7  REPORT TO  SHORT STAY CENTER AT:  10:30 AM      PHONE # FOR SHORT STAY IS [phone]  DO NOT EAT OR DRINK ANYTHING AFTER MIDNIGHT THE NIGHT BEFORE YOUR SURGERY.  YOU MAY BRUSH YOUR TEETH, RINSE OUT YOUR MOUTH--BUT NO WATER, NO FOOD, NO CHEWING GUM, NO MINTS, NO CANDIES, NO CHEWING TOBACCO.  PLEASE TAKE THE FOLLOWING MEDICATIONS THE AM OF YOUR SURGERY WITH A FEW SIPS OF WATER:  ZOLOFT, PROTONIX.  MAY USE YOUR FLONASE AND USE YOUR ALBUTEROL INHALER  IF YOU USE INHALERS--USE YOUR INHALERS THE AM OF YOUR SURGERY AND BRING INHALERS TO THE HOSPITAL.      DO NOT BRING VALUABLES, MONEY, CREDIT CARDS.  DO NOT WEAR JEWELRY, MAKE-UP, NAIL POLISH AND NO METAL PINS OR CLIPS IN YOUR HAIR. CONTACT LENS, DENTURES / PARTIALS, GLASSES SHOULD NOT BE WORN TO SURGERY AND IN MOST CASES-HEARING AIDS WILL NEED TO BE REMOVED.  BRING YOUR GLASSES CASE, ANY EQUIPMENT NEEDED FOR YOUR CONTACT LENS. FOR PATIENTS ADMITTED TO THE HOSPITAL--CHECK OUT TIME THE DAY OF DISCHARGE IS 11:00 AM.  ALL INPATIENT ROOMS ARE PRIVATE - WITH BATHROOM, TELEPHONE, TELEVISION AND WIFI INTERNET.  IF YOU ARE BEING DISCHARGED THE SAME DAY OF YOUR SURGERY--YOU CAN NOT DRIVE YOURSELF HOME--AND SHOULD NOT GO HOME ALONE BY TAXI OR BUS.  NO DRIVING OR OPERATING MACHINERY FOR 24 HOURS FOLLOWING ANESTHESIA / PAIN MEDICATIONS.  PLEASE MAKE ARRANGEMENTS FOR SOMEONE TO BE WITH YOU AT HOME THE FIRST 24 HOURS AFTER SURGERY. RESPONSIBLE DRIVER'S NAME  BRIAN Sendejo - PT'S HUSBAND                                               PHONE #   [phone]                             PLEASE READ OVER ANY  FACT SHEETS THAT YOU WERE GIVEN: MRSA INFORMATION FAILURE TO FOLLOW THESE INSTRUCTIONS MAY RESULT IN THE CANCELLATION OF YOUR SURGERY.   PATIENT SIGNATURE_________________________________  PT NOTIFIED HER SURGERY TIME WAS MOVED TO 1:00 PM TOMORROW INSTEAD OF 12:30 PM AND SHE SHOULD  ARRIVE TO SHORT STAY BY 11:00 AM.

## 2012-10-22 NOTE — Pre-Procedure Instructions (Signed)
CXR WAS DONE TODAY - HX PULMONARY NODULES. EKG NOT NEEDED - PER ANESTHESIOLOGIST GUIDELINES-AND -PT HAS NORMAL NUCLEAR STRESS TEST REPORT IN EPIC FROM 08/16/11.

## 2012-10-23 ENCOUNTER — Telehealth (INDEPENDENT_AMBULATORY_CARE_PROVIDER_SITE_OTHER): Payer: Self-pay | Admitting: General Surgery

## 2012-10-23 NOTE — Telephone Encounter (Signed)
Pt's MD noted a very small umbilical hernia during recent exam.  Pt called to ask if this "would be a problem" for her surgery, which is a planned laproscopic procedure.  Please advise.

## 2012-10-26 NOTE — Telephone Encounter (Signed)
No need to worry, we will address umbilical hernia at time of surgery.

## 2012-10-27 ENCOUNTER — Ambulatory Visit: Payer: Managed Care, Other (non HMO) | Admitting: Family Medicine

## 2012-10-28 ENCOUNTER — Encounter (HOSPITAL_COMMUNITY): Payer: Self-pay | Admitting: Anesthesiology

## 2012-10-28 ENCOUNTER — Ambulatory Visit (HOSPITAL_COMMUNITY): Payer: Managed Care, Other (non HMO)

## 2012-10-28 ENCOUNTER — Encounter (HOSPITAL_COMMUNITY): Payer: Self-pay | Admitting: *Deleted

## 2012-10-28 ENCOUNTER — Ambulatory Visit (HOSPITAL_COMMUNITY)
Admission: RE | Admit: 2012-10-28 | Discharge: 2012-10-28 | Disposition: A | Discharge status: Home / Self Care | Payer: Managed Care, Other (non HMO) | Source: Ambulatory Visit | Attending: General Surgery | Admitting: General Surgery

## 2012-10-28 ENCOUNTER — Ambulatory Visit (HOSPITAL_COMMUNITY): Payer: Managed Care, Other (non HMO) | Admitting: Anesthesiology

## 2012-10-28 ENCOUNTER — Encounter (HOSPITAL_COMMUNITY)
Admission: RE | Disposition: A | Discharge status: Home / Self Care | Payer: Self-pay | Source: Ambulatory Visit | Attending: General Surgery

## 2012-10-28 DIAGNOSIS — J45909 Unspecified asthma, uncomplicated: Secondary | ICD-10-CM | POA: Insufficient documentation

## 2012-10-28 DIAGNOSIS — E785 Hyperlipidemia, unspecified: Secondary | ICD-10-CM | POA: Insufficient documentation

## 2012-10-28 DIAGNOSIS — F411 Generalized anxiety disorder: Secondary | ICD-10-CM | POA: Insufficient documentation

## 2012-10-28 DIAGNOSIS — K8010 Calculus of gallbladder with chronic cholecystitis without obstruction: Secondary | ICD-10-CM

## 2012-10-28 DIAGNOSIS — F329 Major depressive disorder, single episode, unspecified: Secondary | ICD-10-CM | POA: Insufficient documentation

## 2012-10-28 DIAGNOSIS — E669 Obesity, unspecified: Secondary | ICD-10-CM | POA: Insufficient documentation

## 2012-10-28 DIAGNOSIS — Z79899 Other long term (current) drug therapy: Secondary | ICD-10-CM | POA: Insufficient documentation

## 2012-10-28 DIAGNOSIS — IMO0001 Reserved for inherently not codable concepts without codable children: Secondary | ICD-10-CM | POA: Insufficient documentation

## 2012-10-28 DIAGNOSIS — K219 Gastro-esophageal reflux disease without esophagitis: Secondary | ICD-10-CM | POA: Insufficient documentation

## 2012-10-28 DIAGNOSIS — F3289 Other specified depressive episodes: Secondary | ICD-10-CM | POA: Insufficient documentation

## 2012-10-28 HISTORY — PX: CHOLECYSTECTOMY: SHX55

## 2012-10-28 SURGERY — LAPAROSCOPIC CHOLECYSTECTOMY WITH INTRAOPERATIVE CHOLANGIOGRAM
Anesthesia: General | Site: Abdomen | Wound class: Clean Contaminated

## 2012-10-28 MED ORDER — OXYCODONE HCL 5 MG/5ML PO SOLN
5.0000 mg | Freq: Once | ORAL | Status: AC | PRN
  Filled 2012-10-28: qty 5

## 2012-10-28 MED ORDER — ACETAMINOPHEN 10 MG/ML IV SOLN
INTRAVENOUS | Status: DC | PRN
  Administered 2012-10-28: 1000 mg via INTRAVENOUS

## 2012-10-28 MED ORDER — SUCCINYLCHOLINE CHLORIDE 20 MG/ML IJ SOLN
INTRAMUSCULAR | Status: DC | PRN
  Administered 2012-10-28 (×2): 100 mg via INTRAVENOUS

## 2012-10-28 MED ORDER — ONDANSETRON HCL 4 MG/2ML IJ SOLN: 4.0000 mg | Freq: Four times a day (QID) | INTRAMUSCULAR | Status: DC | PRN

## 2012-10-28 MED ORDER — LIDOCAINE HCL 1 % IJ SOLN
INTRAMUSCULAR | Status: AC
  Filled 2012-10-28: qty 20

## 2012-10-28 MED ORDER — MEPERIDINE HCL 50 MG/ML IJ SOLN: 6.2500 mg | INTRAMUSCULAR | Status: DC | PRN

## 2012-10-28 MED ORDER — SODIUM CHLORIDE 0.9 % IJ SOLN: 3.0000 mL | Freq: Two times a day (BID) | INTRAMUSCULAR | Status: DC

## 2012-10-28 MED ORDER — ACETAMINOPHEN 10 MG/ML IV SOLN: 1000.0000 mg | Freq: Once | INTRAVENOUS | Status: DC | PRN

## 2012-10-28 MED ORDER — ACETAMINOPHEN 325 MG PO TABS: 650.0000 mg | ORAL_TABLET | ORAL | Status: DC | PRN

## 2012-10-28 MED ORDER — IOHEXOL 300 MG/ML  SOLN
INTRAMUSCULAR | Status: DC | PRN
  Administered 2012-10-28: 11 mL via INTRAVENOUS

## 2012-10-28 MED ORDER — OXYCODONE HCL 5 MG PO TABS
5.0000 mg | ORAL_TABLET | Freq: Once | ORAL | Status: AC | PRN
  Administered 2012-10-28: 5 mg via ORAL
  Filled 2012-10-28: qty 1

## 2012-10-28 MED ORDER — OXYCODONE HCL 5 MG PO TABS: 5.0000 mg | ORAL_TABLET | ORAL | Status: DC | PRN

## 2012-10-28 MED ORDER — BUPIVACAINE-EPINEPHRINE PF 0.25-1:200000 % IJ SOLN
INTRAMUSCULAR | Status: AC
  Filled 2012-10-28: qty 30

## 2012-10-28 MED ORDER — BUPIVACAINE-EPINEPHRINE 0.25% -1:200000 IJ SOLN
INTRAMUSCULAR | Status: DC | PRN
  Administered 2012-10-28: 4 mL

## 2012-10-28 MED ORDER — ONDANSETRON HCL 4 MG/2ML IJ SOLN
INTRAMUSCULAR | Status: DC | PRN
  Administered 2012-10-28: 4 mg via INTRAVENOUS

## 2012-10-28 MED ORDER — ROCURONIUM BROMIDE 100 MG/10ML IV SOLN
INTRAVENOUS | Status: DC | PRN
  Administered 2012-10-28: 50 mg via INTRAVENOUS

## 2012-10-28 MED ORDER — DEXTROSE 5 % IV SOLN
2.0000 g | INTRAVENOUS | Status: AC
  Administered 2012-10-28: 2 g via INTRAVENOUS

## 2012-10-28 MED ORDER — LACTATED RINGERS IV SOLN
INTRAVENOUS | Status: DC
  Administered 2012-10-28 (×2): via INTRAVENOUS

## 2012-10-28 MED ORDER — HYDROMORPHONE HCL PF 1 MG/ML IJ SOLN
0.2500 mg | INTRAMUSCULAR | Status: DC | PRN
  Administered 2012-10-28 (×2): 0.5 mg via INTRAVENOUS

## 2012-10-28 MED ORDER — PROPOFOL 10 MG/ML IV BOLUS
INTRAVENOUS | Status: DC | PRN
  Administered 2012-10-28 (×2): 200 mg via INTRAVENOUS

## 2012-10-28 MED ORDER — CEFOXITIN SODIUM-DEXTROSE 1-4 GM-% IV SOLR (PREMIX)
INTRAVENOUS | Status: AC
  Filled 2012-10-28: qty 100

## 2012-10-28 MED ORDER — NEOSTIGMINE METHYLSULFATE 1 MG/ML IJ SOLN
INTRAMUSCULAR | Status: DC | PRN
  Administered 2012-10-28: 5 mg via INTRAVENOUS

## 2012-10-28 MED ORDER — IOHEXOL 300 MG/ML  SOLN
INTRAMUSCULAR | Status: AC
  Filled 2012-10-28: qty 1

## 2012-10-28 MED ORDER — FENTANYL CITRATE 0.05 MG/ML IJ SOLN
INTRAMUSCULAR | Status: DC | PRN
  Administered 2012-10-28: 50 ug via INTRAVENOUS
  Administered 2012-10-28: 100 ug via INTRAVENOUS

## 2012-10-28 MED ORDER — DEXAMETHASONE SODIUM PHOSPHATE 10 MG/ML IJ SOLN
INTRAMUSCULAR | Status: DC | PRN
Start: 2012-10-28 — End: 2012-10-28
  Administered 2012-10-28: 10 mg via INTRAVENOUS

## 2012-10-28 MED ORDER — LACTATED RINGERS IR SOLN
Status: DC | PRN
  Administered 2012-10-28: 500 mL

## 2012-10-28 MED ORDER — OXYCODONE-ACETAMINOPHEN 5-325 MG PO TABS
1.0000 | ORAL_TABLET | ORAL | Status: DC | PRN
Start: 2012-10-28 — End: 2012-11-06

## 2012-10-28 MED ORDER — PROMETHAZINE HCL 25 MG/ML IJ SOLN: 6.2500 mg | INTRAMUSCULAR | Status: DC | PRN

## 2012-10-28 MED ORDER — HYDROMORPHONE HCL PF 1 MG/ML IJ SOLN
INTRAMUSCULAR | Status: AC
  Filled 2012-10-28: qty 1

## 2012-10-28 MED ORDER — LIDOCAINE HCL (PF) 1 % IJ SOLN
INTRAMUSCULAR | Status: DC | PRN
  Administered 2012-10-28: 4 mL

## 2012-10-28 MED ORDER — SODIUM CHLORIDE 0.9 % IV SOLN: 250.0000 mL | INTRAVENOUS | Status: DC | PRN

## 2012-10-28 MED ORDER — ACETAMINOPHEN 10 MG/ML IV SOLN
INTRAVENOUS | Status: AC
  Filled 2012-10-28: qty 100

## 2012-10-28 MED ORDER — MIDAZOLAM HCL 5 MG/5ML IJ SOLN
INTRAMUSCULAR | Status: DC | PRN
  Administered 2012-10-28: 2 mg via INTRAVENOUS

## 2012-10-28 MED ORDER — GLYCOPYRROLATE 0.2 MG/ML IJ SOLN
INTRAMUSCULAR | Status: DC | PRN
  Administered 2012-10-28: 0.6 mg via INTRAVENOUS

## 2012-10-28 MED ORDER — SODIUM CHLORIDE 0.9 % IJ SOLN: 3.0000 mL | INTRAMUSCULAR | Status: DC | PRN

## 2012-10-28 MED ORDER — LIDOCAINE HCL (CARDIAC) 20 MG/ML IV SOLN
INTRAVENOUS | Status: DC | PRN
  Administered 2012-10-28: 50 mg via INTRAVENOUS

## 2012-10-28 MED ORDER — ACETAMINOPHEN 650 MG RE SUPP
650.0000 mg | RECTAL | Status: DC | PRN
  Filled 2012-10-28: qty 1

## 2012-10-28 SURGICAL SUPPLY — 38 items
APPLIER CLIP ROT 10 11.4 M/L (STAPLE) ×2
CANISTER SUCTION 2500CC (MISCELLANEOUS) ×2 IMPLANT
CHLORAPREP W/TINT 26ML (MISCELLANEOUS) ×2 IMPLANT
CLIP APPLIE ROT 10 11.4 M/L (STAPLE) ×1 IMPLANT
CLOTH BEACON ORANGE TIMEOUT ST (SAFETY) ×2 IMPLANT
CONT SPECI 4OZ STER CLIK (MISCELLANEOUS) IMPLANT
COVER MAYO STAND STRL (DRAPES) IMPLANT
COVER SURGICAL LIGHT HANDLE (MISCELLANEOUS) ×2 IMPLANT
DECANTER SPIKE VIAL GLASS SM (MISCELLANEOUS) ×2 IMPLANT
DERMABOND ADVANCED (GAUZE/BANDAGES/DRESSINGS) ×1
DERMABOND ADVANCED .7 DNX12 (GAUZE/BANDAGES/DRESSINGS) ×1 IMPLANT
DRAPE C-ARM 42X72 X-RAY (DRAPES) IMPLANT
DRAPE LAPAROSCOPIC ABDOMINAL (DRAPES) ×2 IMPLANT
ELECT REM PT RETURN 9FT ADLT (ELECTROSURGICAL) ×2
ELECTRODE REM PT RTRN 9FT ADLT (ELECTROSURGICAL) ×1 IMPLANT
GLOVE BIO SURGEON STRL SZ 6 (GLOVE) ×4 IMPLANT
GLOVE BIOGEL PI IND STRL 6.5 (GLOVE) ×1 IMPLANT
GLOVE BIOGEL PI IND STRL 7.0 (GLOVE) ×1 IMPLANT
GLOVE BIOGEL PI INDICATOR 6.5 (GLOVE) ×1
GLOVE BIOGEL PI INDICATOR 7.0 (GLOVE) ×1
GLOVE INDICATOR 6.5 STRL GRN (GLOVE) ×4 IMPLANT
GOWN PREVENTION PLUS XLARGE (GOWN DISPOSABLE) IMPLANT
GOWN PREVENTION PLUS XXLARGE (GOWN DISPOSABLE) ×2 IMPLANT
GOWN STRL NON-REIN LRG LVL3 (GOWN DISPOSABLE) IMPLANT
HEMOSTAT SURGICEL 4X8 (HEMOSTASIS) IMPLANT
KIT BASIN OR (CUSTOM PROCEDURE TRAY) ×2 IMPLANT
POUCH SPECIMEN RETRIEVAL 10MM (ENDOMECHANICALS) ×2 IMPLANT
SET CHOLANGIOGRAPH MIX (MISCELLANEOUS) IMPLANT
SET IRRIG TUBING LAPAROSCOPIC (IRRIGATION / IRRIGATOR) ×2 IMPLANT
SOLUTION ANTI FOG 6CC (MISCELLANEOUS) ×2 IMPLANT
SUT MNCRL AB 4-0 PS2 18 (SUTURE) ×2 IMPLANT
SUT VICRYL 0 UR6 27IN ABS (SUTURE) ×2 IMPLANT
TOWEL OR 17X26 10 PK STRL BLUE (TOWEL DISPOSABLE) ×4 IMPLANT
TRAY LAP CHOLE (CUSTOM PROCEDURE TRAY) ×2 IMPLANT
TROCAR BLADELESS OPT 5 75 (ENDOMECHANICALS) ×4 IMPLANT
TROCAR XCEL BLUNT TIP 100MML (ENDOMECHANICALS) ×2 IMPLANT
TROCAR XCEL NON-BLD 11X100MML (ENDOMECHANICALS) ×2 IMPLANT
TUBING INSUFFLATION 10FT LAP (TUBING) ×2 IMPLANT

## 2012-10-28 NOTE — Anesthesia Postprocedure Evaluation (Signed)
  Anesthesia Post-op Note  Patient: Mallory [name]  Procedure(s) Performed: Procedure(s) (LRB): LAPAROSCOPIC CHOLECYSTECTOMY WITH INTRAOPERATIVE CHOLANGIOGRAM (N/A)  Patient Location: PACU  Anesthesia Type: General  Level of Consciousness: awake and alert   Airway and Oxygen Therapy: Patient Spontanous Breathing  Post-op Pain: mild  Post-op Assessment: Post-op Vital signs reviewed, Patient's Cardiovascular Status Stable, Respiratory Function Stable, Patent Airway and No signs of Nausea or vomiting  Last Vitals:  Filed Vitals:   10/28/12 1445  BP: 130/68  Pulse:   Temp: 36.9 C  Resp:     Post-op Vital Signs: stable   Complications: No apparent anesthesia complications

## 2012-10-28 NOTE — Op Note (Signed)
Laparoscopic Cholecystectomy with IOC Procedure Note  Indications: This patient presents with chronic cholecystitis and cholelithiasis and will undergo laparoscopic cholecystectomy.  Pre-operative Diagnosis: see above  Post-operative Diagnosis: Same  Surgeon: Almond Lint   Assistants: Claud Kelp  Anesthesia: General endotracheal anesthesia and local  ASA Class: 2  Procedure Details  The patient was seen again in the Holding Room. The risks, benefits, complications, treatment options, and expected outcomes were discussed with the patient. The possibilities of  bleeding, recurrent infection, damage to nearby structures, the need for additional procedures, failure to diagnose a condition, the possible need to convert to an open procedure, and creating a complication requiring transfusion or operation were discussed with the patient. The likelihood of improving the patient's symptoms with return to their baseline status is good.    The patient and/or family concurred with the proposed plan, giving informed consent. The site of surgery properly noted. The patient was taken to Operating Room, and the procedure verified as Laparoscopic Cholecystectomy with Intraoperative Cholangiogram. A Time Out was held and the above information confirmed.  Prior to the induction of general anesthesia, antibiotic prophylaxis was administered. General endotracheal anesthesia was then administered and tolerated well. After the induction, the abdomen was prepped with Chloraprep and draped in the sterile fashion. The patient was positioned in the supine position.  Local anesthetic agent was injected into the skin near the umbilicus and an incision made. We dissected down to the abdominal fascia with blunt dissection.  The fascia was incised vertically and we entered the peritoneal cavity bluntly.  A pursestring suture of 0-Vicryl was placed around the fascial opening.  The Hasson cannula was inserted and secured  with the stay suture.  Pneumoperitoneum was then created with CO2 and tolerated well without any adverse changes in the patient's vital signs. An 11-mm port was placed in the subxiphoid position.  Two 5-mm ports were placed in the right upper quadrant. All skin incisions were infiltrated with a local anesthetic agent before making the incision and placing the trocars.   We positioned the patient in reverse Trendelenburg, tilted slightly to the patient's left.  The gallbladder was identified, the fundus grasped and retracted cephalad. Adhesions were lysed bluntly and with the electrocautery where indicated, taking care not to injure any adjacent organs or viscus. The infundibulum was grasped and retracted laterally, exposing the peritoneum overlying the triangle of Calot. This was then divided and exposed in a blunt fashion. A critical view of the cystic duct and cystic artery was obtained.  The cystic duct was clearly identified and bluntly dissected circumferentially. The cystic duct was ligated with a clip distally.   An incision was made in the cystic duct and the [facility] cholangiogram catheter introduced. The catheter was secured using a clip. A cholangiogram was then performed, demonstrating good filling of the right and left hepatic ducts, the common duct, and the duodenum.  No filling defects were seen.  .  The cystic duct was then ligated with clips and divided. The cystic artery was identified, dissected free, ligated with clips and divided as well.  Several additional venous branches were clipped.    The gallbladder was dissected from the liver bed in retrograde fashion with the electrocautery. The gallbladder was removed and placed in an Endocatch bag.  The gallbladder and Endocatch bag were then removed through the umbilical port site.  The liver bed was irrigated and inspected. Hemostasis was achieved with the electrocautery. Copious irrigation was utilized and was repeatedly aspirated until clear.  We again inspected the right upper quadrant for hemostasis.  Pneumoperitoneum was released as we removed the trocars.   The pursestring suture was used to close the umbilical fascia.  4-0 Monocryl was used to close the skin.   The skin was cleaned and dry, and Dermabond was applied. The patient was then extubated and brought to the recovery room in stable condition. Instrument, sponge, and needle counts were correct at closure and at the conclusion of the case.   Findings: Mild chronic inflammation..    Estimated Blood Loss: min         Drains: none          Specimens: Gallbladder to pathology       Complications: None; patient tolerated the procedure well.         Disposition: PACU - hemodynamically stable.         Condition: stable

## 2012-10-28 NOTE — Interval H&P Note (Signed)
History and Physical Interval Note:  10/28/2012 1:11 PM  Mallory [name]  has presented today for surgery, with the diagnosis of chronic choleystitis cholelithiasis  The various methods of treatment have been discussed with the patient and family. After consideration of risks, benefits and other options for treatment, the patient has consented to  Procedure(s): LAPAROSCOPIC CHOLECYSTECTOMY WITH INTRAOPERATIVE CHOLANGIOGRAM (N/A) as a surgical intervention .  The patient's history has been reviewed, patient examined, no change in status, stable for surgery.  I have reviewed the patient's chart and labs.  Questions were answered to the patient's satisfaction.     Mallory [name]

## 2012-10-28 NOTE — Anesthesia Procedure Notes (Signed)
Procedure Name: Intubation Date/Time: 10/28/2012 1:46 PM Performed by: [place], Yordy Matton C Pre-anesthesia Checklist: Patient being monitored, Suction available, Emergency Drugs available, Patient identified and Timeout performed Patient Re-evaluated:Patient Re-evaluated prior to inductionOxygen Delivery Method: Circle system utilized Preoxygenation: Pre-oxygenation with 100% oxygen Intubation Type: Combination inhalational/ intravenous induction Ventilation: Mask ventilation without difficulty and Oral airway inserted - appropriate to patient size Laryngoscope Size: Hyacinth Meeker and 2 Grade View: Grade II Tube type: Oral Tube size: 7.5 mm Number of attempts: 2 Airway Equipment and Method: Stylet Placement Confirmation: ETT inserted through vocal cords under direct vision,  breath sounds checked- equal and bilateral,  positive ETCO2 and CO2 detector Secured at: 21 cm Tube secured with: Tape Dental Injury: Teeth and Oropharynx as per pre-operative assessment  Future Recommendations: Recommend- induction with short-acting agent, and alternative techniques readily available Comments: Recommend per Dr. Renold Don Glidescope intubation in the future due to dentition and narrow mouth

## 2012-10-28 NOTE — Progress Notes (Signed)
Pt ambulated in short stay area and tolerated well. Pt voided moderate amount.

## 2012-10-28 NOTE — Anesthesia Preprocedure Evaluation (Addendum)
Anesthesia Evaluation  Patient identified by MRN, date of birth, ID band Patient awake    Reviewed: Allergy & Precautions, H&P , NPO status , Patient's Chart, lab work & pertinent test results  Airway Mallampati: II TM Distance: >3 FB Neck ROM: Full    Dental  (+) Dental Advisory Given and Teeth Intact   Pulmonary neg pulmonary ROS, asthma ,  breath sounds clear to auscultation        Cardiovascular negative cardio ROS  Rhythm:Regular Rate:Normal     Neuro/Psych  Headaches, PSYCHIATRIC DISORDERS Anxiety Depression  Neuromuscular disease    GI/Hepatic negative GI ROS, Neg liver ROS, hiatal hernia, GERD-  Medicated,  Endo/Other  negative endocrine ROS  Renal/GU negative Renal ROS  negative genitourinary   Musculoskeletal negative musculoskeletal ROS (+) Fibromyalgia -  Abdominal (+) + obese,   Peds  Hematology negative hematology ROS (+)   Anesthesia Other Findings   Reproductive/Obstetrics                          Anesthesia Physical Anesthesia Plan  ASA: II  Anesthesia Plan: General   Post-op Pain Management:    Induction: Intravenous  Airway Management Planned: Oral ETT  Additional Equipment:   Intra-op Plan:   Post-operative Plan: Extubation in OR  Informed Consent: I have reviewed the patients History and Physical, chart, labs and discussed the procedure including the risks, benefits and alternatives for the proposed anesthesia with the patient or authorized representative who has indicated his/her understanding and acceptance.   Dental advisory given  Plan Discussed with: CRNA  Anesthesia Plan Comments:         Anesthesia Quick Evaluation

## 2012-10-28 NOTE — Transfer of Care (Signed)
Immediate Anesthesia Transfer of Care Note  Patient: Mallory [name]  Procedure(s) Performed: Procedure(s): LAPAROSCOPIC CHOLECYSTECTOMY WITH INTRAOPERATIVE CHOLANGIOGRAM (N/A)  Patient Location: PACU  Anesthesia Type:General  Level of Consciousness: awake and alert   Airway & Oxygen Therapy: Patient Spontanous Breathing and Patient connected to face mask oxygen  Post-op Assessment: Report given to PACU RN and Post -op Vital signs reviewed and stable  Post vital signs: Reviewed and stable  Complications: No apparent anesthesia complications

## 2012-10-28 NOTE — H&P (View-Only) (Signed)
Chief Complaint  Patient presents with  . New Evaluation    eval chole w/ abd pain    HISTORY: Agents Mallory [name] who presents with a severe episode of right upper quadrant/ epigastric pain that occurred 2 weeks ago.  She describes it like a punch to the stomach.  The pain lasted around 2 hours.  It was associated with nausea, and she has continued to have nausea since she had the pain.  She does not recall what she ate that day, but she usually has a latte from [employer] and a bacon breakfast sandwich in the morning.  She has a history of IBS, and thought the diarrhea she was having was from that.  It has seemed to be a little worse with dairy.  She denies fever/chills.  She denies jaundice or dark urine, but she did have yellow stools.  Her mother had gallbladder disease.    Past Medical History  Diagnosis Date  . Anxiety   . Depression     no counseling  . GERD (gastroesophageal reflux disease)   . Allergy     year round  . Hyperlipidemia 2002  . IBS (irritable bowel syndrome) 2008    improved over last couple of years using Align  . Interstitial cystitis 2006    Dr Stephen Dahlstedt  . Pulmonary nodule 3/24/20101    Incidental finding of CT Chest ordered for chest pain  . Personal history of kidney stones 1986  . Asthma     mild, intermittent, allergy triggered  . Obesity   . Fibromyalgia 1997    dx by rheumatology, no recent flares  . Hiatal hernia   . Nasal lesion 10/24/2010  . OBESITY 02/10/2009  . NECK PAIN 12/15/2009  . GERD 02/10/2009  . DEPRESSION 02/10/2009  . Chronic laryngitis 04/26/2009  . ANXIETY 02/10/2009  . ALLERGIC RHINITIS 02/10/2009  . Knee pain, right 12/01/2010  . Pedal edema 02/11/2011  . Endometriosis 03/20/2011  . Headache 04/30/2011  . Fasciculations of muscle 08/07/2011  . Elevated BP 08/07/2011  . Atypical chest pain 08/07/2011  . Fatigue 03/13/2012    Past Surgical History  Procedure Laterality Date  . Dilation and curettage of uterus  2002  .  Tonsillectomy    . Polypectomy      uterine    Current Outpatient Prescriptions  Medication Sig Dispense Refill  . ALPRAZolam (XANAX) 0.5 MG tablet TAKE 1/2 TABLET TWICE DAILY AS NEEDED  60 tablet  0  . bifidobacterium infantis (ALIGN) capsule Take 1 capsule by mouth daily.        . Cholecalciferol (VITAMIN D3) 2000 UNITS TABS Take 2,000 Units by mouth daily.      . fluticasone (FLONASE) 50 MCG/ACT nasal spray Place 2 sprays into the nose daily.  48 g  2  . levocetirizine (XYZAL) 5 MG tablet TAKE 1 TABLET BY MOUTH EVERY DAY  90 tablet  1  . Norethindrone-Ethinyl Estradiol-Fe Biphas (LO LOESTRIN FE) 1 MG-10 MCG / 10 MCG tablet Take 1 tablet by mouth daily.  1 Package  11  . pantoprazole (PROTONIX) 40 MG tablet Take 1 tablet (40 mg total) by mouth 2 (two) times daily.  180 tablet  1  . PROAIR HFA 108 (90 BASE) MCG/ACT inhaler INHALE 1- 2 PUFFS BY MOUTH EVERY 4 -6 HOURS AS NEEDED  8 Inhaler  2  . promethazine (PHENERGAN) 25 MG tablet TAKE 1 TABLET (25 MG TOTAL) BY MOUTH 2 (TWO) TIMES DAILY AS NEEDED. FOR NAUSEA AND VOMITTING    60 tablet  1  . sertraline (ZOLOFT) 100 MG tablet 1 tab po qd x 14d, then increase to 1 and 1/2 tabs po qd  45 tablet  1   No current facility-administered medications for this visit.     Allergies  Allergen Reactions  . Venlafaxine     REACTION: bad dreams     Family History  Problem Relation Age of Onset  . Diabetes Mother   . Hyperlipidemia Mother   . Hypertension Mother   . Breast cancer Mother   . Cancer Mother     breast/ ovarian  . Cancer Maternal Aunt     colon  . Arthritis Paternal Grandmother   . Heart disease Paternal Grandmother   . Stroke Father 71  . Migraines Sister   . Cancer Maternal Grandmother   . Cancer Maternal Grandfather      History   Social History  . Marital Status: Married    Spouse Name: N/A    Number of Children: 2  . Years of Education: N/A   Occupational History  . unemployed    Social History Main Topics  .  Smoking status: Former Smoker    Types: Cigarettes    Quit date: 06/24/1985  . Smokeless tobacco: Never Used     Comment: when a teenager  . Alcohol Use: No  . Drug Use: No  . Sexually Active: Yes -- [name] partner(s)   Other Topics Concern  . None   Social History Narrative   Married, has one grown son and one grown daughter.   Has 2 grandchildren.   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     REVIEW OF SYSTEMS - PERTINENT POSITIVES ONLY: 12 point review of systems negative other than HPI and PMH except for asthma, bronchitis, easy bruising, sinus issues, joint pain.    EXAM: Filed Vitals:   10/19/12 0857  BP: 118/82  Pulse: 76  Temp: 97.9 F (36.6 C)  Resp: 18    Gen:  No acute distress.  Well nourished and well groomed.   Neurological: Alert and oriented to person, place, and time. Coordination normal.  Head: Normocephalic and atraumatic.  Eyes: Conjunctivae are normal. Pupils are equal, round, and reactive to light. No scleral icterus.  Neck: Normal range of motion. Neck supple. No tracheal deviation or thyromegaly present.  Cardiovascular: Normal rate, regular rhythm, normal heart sounds and intact distal pulses.  Exam reveals no gallop and no friction rub.  No murmur heard. Respiratory: Effort normal.  No respiratory distress. No chest wall tenderness. Breath sounds normal.  No wheezes, rales or rhonchi.  GI: Soft. Bowel sounds are normal. The abdomen is soft .  There is no hepatosplenomegaly.  There is tenderness in the RUQ over the gallbladder.  There is no rebound and no guarding.  Musculoskeletal: Normal range of motion. Extremities are nontender.  Lymphadenopathy: No cervical, preauricular, postauricular or axillary adenopathy is present Skin: Skin is warm and dry. No rash noted. No diaphoresis. No erythema. No pallor. No clubbing, cyanosis, or edema.   Psychiatric: Normal mood and affect. Behavior is normal. Judgment and thought content  normal.    LABORATORY RESULTS: Available labs are reviewed  CMET, CBC essentially normal except for Alk phos 132  RADIOLOGY RESULTS: See E-Chart or I-Site for most recent results.  Images and reports are reviewed. Ultrasound IMPRESSION:  1. Cholelithiasis and borderline to mild gallbladder wall  thickening, but no sonographic Murphy's sign elicited to strongly  suggest acute   cholecystitis. Clinical correlation recommended.  2. No biliary ductal dilatation and otherwise negative abdominal  ultrasound.   ASSESSMENT AND PLAN: Chronic cholecystitis with calculus Patient is a Mallory [name] who appears to have chronic cholecystitis based on the tenderness that she is having all the time and the continuous symptoms.  She has already changed to a low-fat diet and I do not think she would benefit from attempting to wait to have her gallbladder out.   The surgical procedure was described to the patient in detail.  The patient was given educational material.  I discussed the incision type and location, the location of the gallbladder, the anatomy of the bile ducts and arteries, and the typical progression of surgery.  I discussed the possibility of converting to an open operation.  I advised of the risks of bleeding, infection, damage to other structures (such as the bile duct, intestine or liver), bile leak, need for other procedures or surgeries, and post op diarrhea/constipation.   We discussed the recovery period and post operative restrictions.  The patient was advised against taking blood thinners the week before surgery.          Savannaha Stonerock L Danaysha Kirn MD Surgical Oncology, General and Endocrine Surgery [facility]      Visit Diagnoses: 1. Chronic cholecystitis with calculus     Primary Care Physician: MCGOWEN,PHILIP H, MD    

## 2012-10-29 ENCOUNTER — Telehealth (INDEPENDENT_AMBULATORY_CARE_PROVIDER_SITE_OTHER): Payer: Self-pay

## 2012-10-29 ENCOUNTER — Encounter (HOSPITAL_COMMUNITY): Payer: Self-pay | Admitting: General Surgery

## 2012-10-29 NOTE — Telephone Encounter (Signed)
Pt called stating she is doing well but has some facial flushing. No swelling,fever,rash,difficulty swollowing or itching. Pt advised to reduce narcotic as soon as she can tolerate and go to tylenol or advil. Pt advised to call if she has any of the above symptoms. Pt states she understands.

## 2012-10-29 NOTE — Anesthesia Postprocedure Evaluation (Signed)
  Anesthesia Post-op Note  Patient: Mallory [name]  Procedure(s) Performed: Procedure(s) (LRB): LAPAROSCOPIC CHOLECYSTECTOMY WITH INTRAOPERATIVE CHOLANGIOGRAM (N/A)  Patient Location: PACU  Anesthesia Type: General  Level of Consciousness: awake and alert   Airway and Oxygen Therapy: Patient Spontanous Breathing  Post-op Pain: mild  Post-op Assessment: Post-op Vital signs reviewed, Patient's Cardiovascular Status Stable, Respiratory Function Stable, Patent Airway and No signs of Nausea or vomiting  Last Vitals:  Filed Vitals:   10/28/12 1730  BP: 118/67  Pulse: 80  Temp:   Resp: 16    Post-op Vital Signs: stable   Complications: No apparent anesthesia complications

## 2012-11-02 ENCOUNTER — Telehealth (INDEPENDENT_AMBULATORY_CARE_PROVIDER_SITE_OTHER): Payer: Self-pay | Admitting: General Surgery

## 2012-11-02 NOTE — Telephone Encounter (Signed)
Patient calling status post lap chole on 10/28/2012 complaining of bloating and tightness after she eats. She is only eating small amounts. Today she has had peanut butter and jelly with a glass of milk and pizza for lunch. She is having this feeling only after she eats. No other symptoms. No nausea. She is moving her bowels daily and having no loose stools. I advised to slowly add greasy, spicy and dairy foods back into her diet because her body may need time to adjust to not having the gallbladder. I also advised she could still have some trapped gas from surgery and she may want to try gas x or a similar medication. She will call if this does not get better. I told her I would let Dr Donell Beers know what is going on and we would call with any other suggestions.

## 2012-11-03 ENCOUNTER — Telehealth (INDEPENDENT_AMBULATORY_CARE_PROVIDER_SITE_OTHER): Payer: Self-pay | Admitting: General Surgery

## 2012-11-03 NOTE — Telephone Encounter (Signed)
Patient called back and stated she no longer thinks it is due to food she is eating because she now has it all the time.  When asking patient about her bowel movements patient states they have been really small.  Encouraged patient to try Milk of Mag for the next 24 hours to see if she can get a regular sized bowel movement and move some of the gas.  Patient states understanding and agreeable at this time.  Patient instructed to call back in 24 hours if no improvement.

## 2012-11-03 NOTE — Telephone Encounter (Signed)
Called c/o diarrhea after taking milk of magnesia and wondered if she should continue taking it.  She doesn't feel that it has helped the bloating much.  She does have some abdominal pain at the incisions.  No nausea or fevers or chills.  I recommended that she stop the MOM since she is having diarrhea now.  I offered to see her in the ER for evaluation but she does not feel as though things are worse and it has been like this for 3 days without any worsening.  I think that she may benefit from abdominal xrays and I recommended that if she does not come to the ER tonight for evaluation, that she should call the office in the AM to see if she can be evaluated sooner than her regular follow up visit.

## 2012-11-04 ENCOUNTER — Ambulatory Visit
Admission: RE | Admit: 2012-11-04 | Discharge: 2012-11-04 | Disposition: A | Discharge status: Home / Self Care | Payer: Managed Care, Other (non HMO) | Source: Ambulatory Visit | Attending: General Surgery | Admitting: General Surgery

## 2012-11-04 ENCOUNTER — Telehealth (INDEPENDENT_AMBULATORY_CARE_PROVIDER_SITE_OTHER): Payer: Self-pay

## 2012-11-04 DIAGNOSIS — R198 Other specified symptoms and signs involving the digestive system and abdomen: Secondary | ICD-10-CM

## 2012-11-04 DIAGNOSIS — Z9049 Acquired absence of other specified parts of digestive tract: Secondary | ICD-10-CM

## 2012-11-04 DIAGNOSIS — R079 Chest pain, unspecified: Secondary | ICD-10-CM

## 2012-11-04 DIAGNOSIS — R0602 Shortness of breath: Secondary | ICD-10-CM

## 2012-11-04 LAB — CBC WITH DIFFERENTIAL/PLATELET
Hemoglobin: 12.3 g/dL (ref 12.0–15.0)
Lymphocytes Relative: 35 % (ref 12–46)
Lymphs Abs: 3.6 10*3/uL (ref 0.7–4.0)
Monocytes Relative: 4 % (ref 3–12)
Neutro Abs: 6.4 10*3/uL (ref 1.7–7.7)
Neutrophils Relative %: 61 % (ref 43–77)
RBC: 4.68 MIL/uL (ref 3.87–5.11)

## 2012-11-04 LAB — COMPREHENSIVE METABOLIC PANEL
Albumin: 3.7 g/dL (ref 3.5–5.2)
Alkaline Phosphatase: 135 U/L — ABNORMAL HIGH (ref 39–117)
BUN: 12 mg/dL (ref 6–23)
Glucose, Bld: 95 mg/dL (ref 70–99)
Total Bilirubin: 0.3 mg/dL (ref 0.3–1.2)

## 2012-11-04 NOTE — Telephone Encounter (Signed)
The pt called in stating she called yesterday.  She is having tightness in her abdomen and it's making her chest and back hurt.  She is having a hard time breathing deep.  She was taking M.O.M and it caused diarrhea.  She has stopped it after talking to Dr Biagio Quint last night.  She has no fever.  She is eating but not much because she feels so full.  I paged Dr Donell Beers and she wants a kub, chest xray, cbc, and cmet ordered.   I will arrange with the pt.

## 2012-11-05 ENCOUNTER — Telehealth (INDEPENDENT_AMBULATORY_CARE_PROVIDER_SITE_OTHER): Payer: Self-pay | Admitting: *Deleted

## 2012-11-05 NOTE — Telephone Encounter (Signed)
Can you put her on for clinic tomorrow at 11?  That way i can just see her and check her out.

## 2012-11-05 NOTE — Telephone Encounter (Signed)
Patient called to state she continues to have a feeling of bloating.  Patient was given results as being WNL and XR's negative.  This did help to ease patient's thoughts.  Patient asked about things that could be causing the bloating feeling.  Patient denies any other symptoms.  Suggested to patient that with everything coming back WNL it could just be some of the gas still trapped that was used to inflate abdomen during surgery but patient asking if [facility] MD has any other suggestions.

## 2012-11-05 NOTE — Telephone Encounter (Signed)
Appt made and VM left for patient with appt information

## 2012-11-06 ENCOUNTER — Ambulatory Visit (INDEPENDENT_AMBULATORY_CARE_PROVIDER_SITE_OTHER): Payer: Managed Care, Other (non HMO) | Admitting: General Surgery

## 2012-11-06 ENCOUNTER — Encounter (INDEPENDENT_AMBULATORY_CARE_PROVIDER_SITE_OTHER): Payer: Self-pay | Admitting: General Surgery

## 2012-11-06 VITALS — BP 114/72 | HR 64 | Temp 97.9°F | Resp 16 | Ht 71.5 in | Wt 254.0 lb

## 2012-11-06 DIAGNOSIS — R143 Flatulence: Secondary | ICD-10-CM

## 2012-11-06 DIAGNOSIS — R140 Abdominal distension (gaseous): Secondary | ICD-10-CM

## 2012-11-06 DIAGNOSIS — K8010 Calculus of gallbladder with chronic cholecystitis without obstruction: Secondary | ICD-10-CM

## 2012-11-06 NOTE — Patient Instructions (Addendum)
Try adding stool softeners twice daily and Miralax (both over the counter) daily.  Follow up as needed.   Keep May 30 appt unless you are feeling fine.

## 2012-11-06 NOTE — Assessment & Plan Note (Signed)
No evidence of surgical complication.  Will repeat labs in 1-2 weeks to make sure they return to normal.

## 2012-11-06 NOTE — Progress Notes (Signed)
HISTORY: Pt with bloating post lap chole.  Alk phos still elevated, ALT sl increased.  Pt denies pain, nausea and vomiting.  She is not having fevers/chills.  The bloating has gotten a lot better since she took milk of magnesia.      EXAM: General:  Alert and oriented.  Well groomed, no distress. Incision:  Healing well.  Abdomen soft, non tender, non distended.     PATHOLOGY: Chronic cholecystitis and cholelithiasis.   ASSESSMENT AND PLAN:   Bloating Probable constipation.  Advised to take stool softeners and miralax.  Follow up in 2 weeks if not resolved.    Chronic cholecystitis with calculus No evidence of surgical complication.  Will repeat labs in 1-2 weeks to make sure they return to normal.        Maudry Diego, MD Surgical Oncology, General & Endocrine Surgery [facility]  Jeoffrey Massed, MD [place], Maryjean Morn, MD

## 2012-11-06 NOTE — Assessment & Plan Note (Signed)
Probable constipation.  Advised to take stool softeners and miralax.  Follow up in 2 weeks if not resolved.

## 2012-11-15 ENCOUNTER — Other Ambulatory Visit: Payer: Self-pay | Admitting: Family Medicine

## 2012-11-17 ENCOUNTER — Other Ambulatory Visit: Payer: Self-pay | Admitting: Family Medicine

## 2012-11-18 ENCOUNTER — Other Ambulatory Visit (INDEPENDENT_AMBULATORY_CARE_PROVIDER_SITE_OTHER): Payer: Self-pay | Admitting: General Surgery

## 2012-11-18 DIAGNOSIS — K811 Chronic cholecystitis: Secondary | ICD-10-CM

## 2012-11-18 NOTE — Telephone Encounter (Signed)
Rx request to pharmacy/SLS  

## 2012-11-19 ENCOUNTER — Telehealth: Payer: Self-pay | Admitting: *Deleted

## 2012-11-19 ENCOUNTER — Telehealth (INDEPENDENT_AMBULATORY_CARE_PROVIDER_SITE_OTHER): Payer: Self-pay | Admitting: *Deleted

## 2012-11-19 NOTE — Telephone Encounter (Signed)
Patient called to state that all of her symptoms have resolved and she doesn't feel like she needs to come in to see Dr. Donell Beers.  Per Dr. Arita Miss note the appt may be cancelled for tomorrow if symptoms have resolved so appt will be cancelled at this time per patient.  Order was placed for a CMP at [facility] so patient will go to have labs drawn today or tomorrow.  Patient is agreeable with plan and states understanding.

## 2012-11-19 NOTE — Telephone Encounter (Signed)
May have sertraline 100mg , 1 and 1/2 tabs po qd, #135, RF x 1. Pls ask pt to arrange routine office f/u to monitor her on this med in 3-4 months.-thx

## 2012-11-19 NOTE — Telephone Encounter (Signed)
Fax request for SERTRALINE Last Rx: 04.08.14, #[id number] Last OV: 04.08.14 Return: due 2 wks, canceled appt x2 Pharmacy requesting 90-day supply, please advise/SLS

## 2012-11-20 ENCOUNTER — Encounter (INDEPENDENT_AMBULATORY_CARE_PROVIDER_SITE_OTHER): Payer: Managed Care, Other (non HMO) | Admitting: General Surgery

## 2012-11-20 LAB — COMPREHENSIVE METABOLIC PANEL
AST: 21 U/L (ref 0–37)
Albumin: 4 g/dL (ref 3.5–5.2)
BUN: 11 mg/dL (ref 6–23)
Calcium: 9.7 mg/dL (ref 8.4–10.5)
Chloride: 107 mEq/L (ref 96–112)
Glucose, Bld: 105 mg/dL — ABNORMAL HIGH (ref 70–99)
Potassium: 4.3 mEq/L (ref 3.5–5.3)
Total Protein: 6.9 g/dL (ref 6.0–8.3)

## 2012-11-20 MED ORDER — SERTRALINE HCL 100 MG PO TABS: 100.0000 mg | ORAL_TABLET | Freq: Every morning | ORAL | Status: DC

## 2012-11-20 NOTE — Telephone Encounter (Signed)
Patient informed, understood & agreed; Rx to pharmacy, pt will call back to schedule f/u OV/SLS

## 2012-12-03 ENCOUNTER — Telehealth: Payer: Self-pay | Admitting: *Deleted

## 2012-12-03 NOTE — Telephone Encounter (Signed)
Pt reports that at last OV her Zoloft was increased form 50 mg to 100 mg daily, to be taken for [2] wks, then increased to 150 mg [Last Rx 05.29.14 #135x1] but that she had to have Gallbladder Sx [been about one month since Sx] prior to increasing to 150 mg. Pt reports side effects that she believes to be coming from 100 mg dosage of medication: "extremely fatigued, dehydration, nausea, cognitive impairment w/ST memory issues". Pt would like to know if she needs to resume 50 mg dosage and/or other recommendations/SLS Please advise.

## 2012-12-03 NOTE — Telephone Encounter (Signed)
Patient informed, understood & agreed/SLS  

## 2012-12-03 NOTE — Telephone Encounter (Signed)
Yes, resume 50mg  dose and see if she feels back to normal after a few days.

## 2012-12-07 ENCOUNTER — Telehealth: Payer: Self-pay | Admitting: Family Medicine

## 2012-12-07 NOTE — Telephone Encounter (Signed)
Alprazolam 0.5mg  tablet qty 60 take 1/2 tablet by mouth twice a day as needed  Last fill 10-08-2012

## 2012-12-07 NOTE — Telephone Encounter (Signed)
Request for ALPRAZOLAM Last Rx: 02.18.14, #60x1 [last pharmacy fill 04.17.14] Last OV: 04.08.14 Return: Due 2 weeks, canceled appt x2;   Spoke with pt on 05.30.14 when refilling Zoloft per provider "Pls ask pt to arrange routine office f/u to monitor her on this med in 3-4 months.-thx"     and was informed that f/u appt would be scheduled, no future appt as of yet. Please advise on refills/SLS

## 2012-12-07 NOTE — Telephone Encounter (Signed)
OK to RF as previously prescribed, with 2 additional RF's.-thx 

## 2012-12-08 MED ORDER — ALPRAZOLAM 0.5 MG PO TABS: 0.2500 mg | ORAL_TABLET | Freq: Every evening | ORAL | Status: DC | PRN

## 2012-12-23 ENCOUNTER — Telehealth: Payer: Self-pay | Admitting: Obstetrics and Gynecology

## 2012-12-23 NOTE — Telephone Encounter (Signed)
Patient called to get names of doctor's that Dr. Edward Jolly had referred her to at her last office visit. Informed patient of Dr. Lyanne Co and the [facility] . Patient stated will call and schedule.

## 2012-12-23 NOTE — Telephone Encounter (Signed)
Patient calling to get names of two MD's that Dr. Edward Jolly referred her to. The patient has misplaced the information.

## 2012-12-28 ENCOUNTER — Telehealth: Payer: Self-pay | Admitting: Family Medicine

## 2012-12-28 ENCOUNTER — Ambulatory Visit (INDEPENDENT_AMBULATORY_CARE_PROVIDER_SITE_OTHER): Payer: Managed Care, Other (non HMO) | Admitting: Family Medicine

## 2012-12-28 ENCOUNTER — Encounter: Payer: Self-pay | Admitting: Family Medicine

## 2012-12-28 VITALS — BP 144/87 | HR 79 | Temp 98.4°F | Resp 16 | Ht 71.5 in | Wt 259.0 lb

## 2012-12-28 DIAGNOSIS — K645 Perianal venous thrombosis: Secondary | ICD-10-CM

## 2012-12-28 MED ORDER — LIDOCAINE-HYDROCORTISONE ACE 2.8-0.55 % RE GEL: RECTAL | Status: DC

## 2012-12-28 NOTE — Progress Notes (Addendum)
OFFICE NOTE  12/28/2012  CC:  Chief Complaint  Patient presents with  . Hemorrhoids     HPI: Patient is a 47 y.o. Caucasian female who is here for suspected hemorrhoid--painful. Long hx of hemorrhoids--external, painful at times, bleeding at times. Lately has had 5d of painful hemorrhoidal swelling, some intermittent bleeding. Has no constipation but has fairly frequent loose BMs since having GB taken out.  She sits all day at work. She does feel like the hemorrhoid feels smaller the last 24h.  Pertinent PMH:  Long past hx of external hemorrhoids on and off--has never had to have a hemorrhoid procedure.  MEDS:  Outpatient Prescriptions Prior to Visit  Medication Sig Dispense Refill  . acetaminophen (TYLENOL) 500 MG tablet Take 500 mg by mouth every 6 (six) hours as needed for pain.      Marland Kitchen albuterol (PROVENTIL HFA;VENTOLIN HFA) 108 (90 BASE) MCG/ACT inhaler Inhale 2 puffs into the lungs every 6 (six) hours as needed for wheezing.      Marland Kitchen ALPRAZolam (XANAX) 0.5 MG tablet Take 0.5 tablets (0.25 mg total) by mouth at bedtime as needed for sleep.  30 tablet  2  . Cholecalciferol (VITAMIN D) 2000 UNITS tablet Take 2,000 Units by mouth every evening.      . fluticasone (FLONASE) 50 MCG/ACT nasal spray PLACE 2 SPRAYS INTO THE NOSE DAILY.  48 g  1  . levocetirizine (XYZAL) 5 MG tablet Take 5 mg by mouth every evening.      . Norethindrone-Ethinyl Estradiol-Fe Biphas (LO LOESTRIN FE) 1 MG-10 MCG / 10 MCG tablet Take 1 tablet by mouth daily. TAKES AT BEDTIME      . pantoprazole (PROTONIX) 40 MG tablet TAKE 1 TABLET BY MOUTH TWICE DAILY  180 tablet  1  . Probiotic Product (ALIGN) 4 MG CAPS Take 4 mg by mouth daily.      . promethazine (PHENERGAN) 25 MG tablet Take 25 mg by mouth every 6 (six) hours as needed for nausea.      . sertraline (ZOLOFT) 100 MG tablet Take 1 tablet (100 mg total) by mouth every morning.  135 tablet  1  . fluticasone (FLONASE) 50 MCG/ACT nasal spray Place 2 sprays into  the nose daily.      . pantoprazole (PROTONIX) 40 MG tablet Take 40 mg by mouth 2 (two) times daily.       No facility-administered medications prior to visit.    PE: Blood pressure 144/87, pulse 79, temperature 98.4 F (36.9 C), temperature source Oral, resp. rate 16, height 5' 11.5" (1.816 m), weight 259 lb (117.482 kg), SpO2 97.00%. Gen: Alert, well appearing.  Patient is oriented to person, place, time, and situation. AFFECT: pleasant, lucid thought and speech. Rectal/Anal exam: several small (1 cm or less) tender and mildly firm external hemorrhoids are present.  There is a clotted-off drainage opening in the center of one of the hemorrhoids.  No fissure.  IMPRESSION AND PLAN:  Thrombosed external hemorrhoids Continue conservative management, as it looks like these have begun to spontaneously resolve now. No procedure needed today. Sitz baths discussed. Lidocaine + hydrocortisone gel tid.  An After Visit Summary was printed and given to the patient.  F/u prn  FOLLOW UP: prn

## 2012-12-28 NOTE — Patient Instructions (Addendum)
Sit in "sitz bath" solution or warm water for 20 min twice daily.

## 2012-12-28 NOTE — Telephone Encounter (Signed)
Patient Information:  Caller Name: Aviya  Phone: [phone]  Patient: Mallory [name], Mallory [name]  Gender: Female  DOB: Apr 23, 1966  Age: 47 Years  PCP: Earley Favor [facility])  Pregnant: No  Office Follow Up:  Does the office need to follow up with this patient?: No  Instructions For The Office: N/A  RN Note:  LMP one yr ago; takes continous estrogen pills. Denies constipation since cholecyctectomy; stools are loose. Initially hemorrhoid  bled, but now just occasional blood on tissue when wipes.  Reports painful to sit for long periods of time. Hemorrhoid pain rated 8/10.  Symptoms  Reason For Call & Symptoms: Painful, throbbing external hemorrhoid.  Reviewed Health History In EMR: Yes  Reviewed Medications In EMR: Yes  Reviewed Allergies In EMR: Yes  Reviewed Surgeries / Procedures: Yes  Date of Onset of Symptoms: 12/23/2012  Treatments Tried: Prep H, Motrin, Tylenol, Witch hazel pads.  Treatments Tried Worked: No OB / GYN:  LMP: Unknown  Guideline(s) Used:  Rectal Symptoms  Disposition Per Guideline:   Go to Office Now  Reason For Disposition Reached:   Severe rectal pain  Advice Given:  Treatment of Mild Rectal Pain:  Rectal pain and irritation can often be caused by either hemorrhoids or a tiny tear in the rectal opening (anal fissure). Small drops of blood can sometimes be seen on the toilet paper or stool in people with hemorrhoids or rectal irritation from hard bowel movements.  Warm SITZ Bath Twice a Day - Sit in a warm saline bath for 20 minutes bid to cleanse the area and to promote healing. Add 2 ounces (57 grams) of table salt or baking soda to each tub of water. Afterwards, gently pat area dry with unscented toilet paper.  Topical Hydrocortisone Twice a Day - After taking a Sitz bath and drying your rectal area, apply 1% hydrocortisone ointment (OTC) bid to reduce irritation. Hydrocortisone is found in various OTC hemorrhoid medications (e.g., Anusol HC,  Preparation H Hydrocortisone, Analpram HC Cream).  Call Back If:  Severe rectal pain or itching  Rectal pain or itching lasts over 3 days  Rectal bleeding (i.e., more than just a few drops on toilet paper from wiping)  You become worse.  Patient Will Follow Care Advice:  YES  Appointment Scheduled:  12/28/2012 15:15:00 Appointment Scheduled Provider:  Earley Favor [facility])

## 2012-12-30 NOTE — Telephone Encounter (Signed)
Error

## 2013-01-15 ENCOUNTER — Ambulatory Visit (INDEPENDENT_AMBULATORY_CARE_PROVIDER_SITE_OTHER): Payer: Managed Care, Other (non HMO) | Admitting: Family Medicine

## 2013-01-15 ENCOUNTER — Encounter: Payer: Self-pay | Admitting: Family Medicine

## 2013-01-15 VITALS — BP 130/82 | HR 82 | Temp 97.8°F | Ht 71.5 in | Wt 259.0 lb

## 2013-01-15 DIAGNOSIS — R109 Unspecified abdominal pain: Secondary | ICD-10-CM

## 2013-01-15 DIAGNOSIS — R7309 Other abnormal glucose: Secondary | ICD-10-CM

## 2013-01-15 DIAGNOSIS — F419 Anxiety disorder, unspecified: Secondary | ICD-10-CM

## 2013-01-15 DIAGNOSIS — F341 Dysthymic disorder: Secondary | ICD-10-CM

## 2013-01-15 DIAGNOSIS — F418 Other specified anxiety disorders: Secondary | ICD-10-CM

## 2013-01-15 DIAGNOSIS — R739 Hyperglycemia, unspecified: Secondary | ICD-10-CM

## 2013-01-15 DIAGNOSIS — R197 Diarrhea, unspecified: Secondary | ICD-10-CM

## 2013-01-15 DIAGNOSIS — E785 Hyperlipidemia, unspecified: Secondary | ICD-10-CM

## 2013-01-15 DIAGNOSIS — E669 Obesity, unspecified: Secondary | ICD-10-CM

## 2013-01-15 LAB — HEPATIC FUNCTION PANEL
ALT: 12 U/L (ref 0–35)
Albumin: 3.9 g/dL (ref 3.5–5.2)
Bilirubin, Direct: 0.1 mg/dL (ref 0.0–0.3)
Total Protein: 7.3 g/dL (ref 6.0–8.3)

## 2013-01-15 LAB — RENAL FUNCTION PANEL
Albumin: 3.9 g/dL (ref 3.5–5.2)
Calcium: 9.3 mg/dL (ref 8.4–10.5)
Creat: 0.75 mg/dL (ref 0.50–1.10)
Glucose, Bld: 90 mg/dL (ref 70–99)
Sodium: 139 mEq/L (ref 135–145)

## 2013-01-15 LAB — CBC
HCT: 38.9 % (ref 36.0–46.0)
MCHC: 33.9 g/dL (ref 30.0–36.0)
Platelets: 336 10*3/uL (ref 150–400)
RDW: 15.3 % (ref 11.5–15.5)
WBC: 7.9 10*3/uL (ref 4.0–10.5)

## 2013-01-15 LAB — SEDIMENTATION RATE: Sed Rate: 30 mm/hr — ABNORMAL HIGH (ref 0–22)

## 2013-01-15 LAB — LIPID PANEL
Cholesterol: 294 mg/dL — ABNORMAL HIGH (ref 0–200)
Triglycerides: 274 mg/dL — ABNORMAL HIGH (ref <150)

## 2013-01-15 LAB — TSH: TSH: 1.275 u[IU]/mL (ref 0.350–4.500)

## 2013-01-15 MED ORDER — ALPRAZOLAM 0.5 MG PO TABS: 0.2500 mg | ORAL_TABLET | Freq: Two times a day (BID) | ORAL | Status: DC | PRN

## 2013-01-15 MED ORDER — SERTRALINE HCL 100 MG PO TABS: 100.0000 mg | ORAL_TABLET | Freq: Every morning | ORAL | Status: DC

## 2013-01-15 NOTE — Patient Instructions (Addendum)
Try switching probiotics and add a fiber supplement once or twice a day  Cholesterol Cholesterol is a white, waxy, fat-like protein needed by your body in small amounts. The liver makes all the cholesterol you need. It is carried from the liver by the blood through the blood vessels. Deposits (plaque) may build up on blood vessel walls. This makes the arteries narrower and stiffer. Plaque increases the risk for heart attack and stroke. You cannot feel your cholesterol level even if it is very high. The only way to know is by a blood test to check your lipid (fats) levels. Once you know your cholesterol levels, you should keep a record of the test results. Work with your caregiver to to keep your levels in the desired range. WHAT THE RESULTS MEAN:  Total cholesterol is a rough measure of all the cholesterol in your blood.  LDL is the so-called bad cholesterol. This is the type that deposits cholesterol in the walls of the arteries. You want this level to be low.  HDL is the good cholesterol because it cleans the arteries and carries the LDL away. You want this level to be high.  Triglycerides are fat that the body can either burn for energy or store. High levels are closely linked to heart disease. DESIRED LEVELS:  Total cholesterol below 200.  LDL below 100 for people at risk, below 70 for very high risk.  HDL above 50 is good, above 60 is best.  Triglycerides below 150. HOW TO LOWER YOUR CHOLESTEROL:  Diet.  Choose fish or white meat chicken and [place], roasted or baked. Limit fatty cuts of red meat, fried foods, and processed meats, such as sausage and lunch meat.  Eat lots of fresh fruits and vegetables. Choose whole grains, beans, pasta, potatoes and cereals.  Use only small amounts of olive, corn or canola oils. Avoid butter, mayonnaise, shortening or palm kernel oils. Avoid foods with trans-fats.  Use skim/nonfat milk and low-fat/nonfat yogurt and cheeses. Avoid whole milk,  cream, ice cream, egg yolks and cheeses. Healthy desserts include angel food cake, ginger snaps, animal crackers, hard candy, popsicles, and low-fat/nonfat frozen yogurt. Avoid pastries, cakes, pies and cookies.  Exercise.  A regular program helps decrease LDL and raises HDL.  Helps with weight control.  Do things that increase your activity level like gardening, walking, or taking the stairs.  Medication.  May be prescribed by your caregiver to help lowering cholesterol and the risk for heart disease.  You may need medicine even if your levels are normal if you have several risk factors. HOME CARE INSTRUCTIONS   Follow your diet and exercise programs as suggested by your caregiver.  Take medications as directed.  Have blood work done when your caregiver feels it is necessary. MAKE SURE YOU:   Understand these instructions.  Will watch your condition.  Will get help right away if you are not doing well or get worse. Document Released: 03/05/2001 Document Revised: 09/02/2011 Document Reviewed: 08/26/2007 [facility] Patient Information 2014 [place], [place].

## 2013-01-15 NOTE — Assessment & Plan Note (Signed)
Increase Sertraline to 100 mg daily and may continue to use Alprazolam prn for insomnia, anxiety

## 2013-01-15 NOTE — Progress Notes (Signed)
Patient ID: Mallory [name], female   DOB: [birth year], 47 y.o.   MRN: [id number] Mallory [name] [id number] [birth year] 01/15/2013      Progress Note-Follow Up  Subjective  Chief Complaint  Chief Complaint  Patient presents with  . Follow-up    med check    HPI  Patient is a 47 year old Caucasian female who is in today for followup. She continues to struggle with great deal of anxiety and low mood secondary to a very stressful job but denies suicidal ideation. She has had some recent loose stool but it is slowly improving. He still has some hip pain right greater than left no recent fall or injury. No recent illness, chest pain, palpitations, shortness of breath, GI or GU concerns are noted.  Past Medical History  Diagnosis Date  . Anxiety   . Depression     no counseling  . GERD (gastroesophageal reflux disease)   . Allergy     year round  . Hyperlipidemia 2002  . IBS (irritable bowel syndrome) 2008    improved over last couple of years using Align  . Interstitial cystitis 2006    Dr Marcine Matar  . Pulmonary nodule 3/24/20101    Incidental finding of CT Chest ordered for chest pain  . Personal history of kidney stones 1986  . Asthma     mild, intermittent, allergy triggered  . Obesity   . Fibromyalgia 1997    dx by rheumatology, no recent flares  . Hiatal hernia   . NECK PAIN 12/15/2009  . GERD 02/10/2009  . DEPRESSION 02/10/2009  . Chronic laryngitis 04/26/2009  . ANXIETY 02/10/2009  . ALLERGIC RHINITIS 02/10/2009  . Pedal edema 02/11/2011  . Endometriosis 03/20/2011  . Headache(784.0) 04/30/2011  . Fasciculations of muscle 08/07/2011  . Elevated BP 08/07/2011    NO LONGER A PROBLEM AND WAS NEVER ON B/P MEDICATION  . Atypical chest pain 08/07/2011    FOLLOW UP NUCLEAR STRESS TEST 08/16/11- NORMAL  . Fatigue 03/13/2012  . Chronic cholecystitis with calculus 09/2012    cholecystectomy 10/2012  . Arthritis     PAIN AT TIMES IN BOTH KNEES-? ARTHRITIS    Past Surgical History   Procedure Laterality Date  . Dilation and curettage of uterus  2002  . Tonsillectomy    . Polypectomy      uterine  . Cholecystectomy N/A 10/28/2012    Procedure: LAPAROSCOPIC CHOLECYSTECTOMY WITH INTRAOPERATIVE CHOLANGIOGRAM;  Surgeon: Almond Lint, MD;  Location: [facility];  Service: General;  Laterality: N/A;    Family History  Problem Relation Age of Onset  . Diabetes Mother   . Hyperlipidemia Mother   . Hypertension Mother   . Breast cancer Mother   . Cancer Mother     breast/ ovarian  . Cancer Maternal Aunt     colon  . Arthritis Paternal Grandmother   . Heart disease Paternal Grandmother   . Stroke Father 79  . Migraines Sister   . Cancer Maternal Grandmother   . Cancer Maternal Grandfather     History   Social History  . Marital Status: Married    Spouse Name: N/A    Number of Children: 2  . Years of Education: N/A   Occupational History  . unemployed    Social History Main Topics  . Smoking status: Former Smoker    Types: Cigarettes    Quit date: 06/24/1985  . Smokeless tobacco: Never Used     Comment: when a teenager  . Alcohol Use: No  .  Drug Use: No  . Sexually Active: Yes -- Female partner(s)   Other Topics Concern  . Not on file   Social History Narrative   Married, has one grown son and one grown daughter.   Has 2 grandchildren.   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    Current Outpatient Prescriptions on File Prior to Visit  Medication Sig Dispense Refill  . acetaminophen (TYLENOL) 500 MG tablet Take 500 mg by mouth every 6 (six) hours as needed for pain.      Marland Kitchen albuterol (PROVENTIL HFA;VENTOLIN HFA) 108 (90 BASE) MCG/ACT inhaler Inhale 2 puffs into the lungs every 6 (six) hours as needed for wheezing.      Marland Kitchen ALPRAZolam (XANAX) 0.5 MG tablet Take 0.5 tablets (0.25 mg total) by mouth at bedtime as needed for sleep.  30 tablet  2  . Cholecalciferol (VITAMIN D) 2000 UNITS tablet Take 2,000 Units by mouth every evening.       . fluticasone (FLONASE) 50 MCG/ACT nasal spray Place 2 sprays into the nose daily.      Marland Kitchen levocetirizine (XYZAL) 5 MG tablet Take 5 mg by mouth every evening.      . Norethindrone-Ethinyl Estradiol-Fe Biphas (LO LOESTRIN FE) 1 MG-10 MCG / 10 MCG tablet Take 1 tablet by mouth daily. TAKES AT BEDTIME      . pantoprazole (PROTONIX) 40 MG tablet Take 40 mg by mouth 2 (two) times daily.      . pantoprazole (PROTONIX) 40 MG tablet TAKE 1 TABLET BY MOUTH TWICE DAILY  180 tablet  1  . Probiotic Product (ALIGN) 4 MG CAPS Take 4 mg by mouth daily.      . promethazine (PHENERGAN) 25 MG tablet Take 25 mg by mouth every 6 (six) hours as needed for nausea.       No current facility-administered medications on file prior to visit.    Allergies  Allergen Reactions  . Venlafaxine     REACTION: bad dreams    Review of Systems  Review of Systems  Constitutional: Negative for fever and malaise/fatigue.  HENT: Negative for congestion.   Eyes: Negative for pain and discharge.  Respiratory: Negative for shortness of breath.   Cardiovascular: Negative for chest pain, palpitations and leg swelling.  Gastrointestinal: Negative for nausea, abdominal pain and diarrhea.  Genitourinary: Negative for dysuria.  Musculoskeletal: Negative for falls.  Skin: Negative for rash.  Neurological: Negative for loss of consciousness and headaches.  Endo/Heme/Allergies: Negative for polydipsia.  Psychiatric/Behavioral: Negative for depression and suicidal ideas. The patient is not nervous/anxious and does not have insomnia.     Objective  BP 130/82  Pulse 82  Temp(Src) 97.8 F (36.6 C) (Oral)  Ht 5' 11.5" (1.816 m)  Wt 259 lb (117.482 kg)  BMI 35.62 kg/m2  SpO2 97%  Physical Exam  Physical Exam  Constitutional: She is oriented to person, place, and time and well-developed, well-nourished, and in no distress. No distress.  HENT:  Head: Normocephalic and atraumatic.  Eyes: Conjunctivae are normal.  Neck:  Neck supple. No thyromegaly present.  Cardiovascular: Normal rate, regular rhythm and normal heart sounds.   No murmur heard. Pulmonary/Chest: Effort normal and breath sounds normal. She has no wheezes.  Abdominal: She exhibits no distension and no mass.  Musculoskeletal: She exhibits no edema.  Lymphadenopathy:    She has no cervical adenopathy.  Neurological: She is alert and oriented to person, place, and time.  Skin: Skin is warm and dry. No  rash noted. She is not diaphoretic.  Psychiatric: Memory, affect and judgment normal.    Lab Results  Component Value Date   TSH 1.40 09/25/2012   Lab Results  Component Value Date   WBC 10.4 11/04/2012   HGB 12.3 11/04/2012   HCT 38.2 11/04/2012   MCV 81.6 11/04/2012   PLT 329 11/04/2012   Lab Results  Component Value Date   CREATININE 0.90 11/18/2012   BUN 11 11/18/2012   NA 147* 11/18/2012   K 4.3 11/18/2012   CL 107 11/18/2012   CO2 25 11/18/2012   Lab Results  Component Value Date   ALT 9 11/18/2012   AST 21 11/18/2012   ALKPHOS 135* 11/18/2012   BILITOT 0.3 11/18/2012   Lab Results  Component Value Date   CHOL 278* 09/25/2012   Lab Results  Component Value Date   HDL 35.50* 09/25/2012   Lab Results  Component Value Date   LDLCALC 229* 08/06/2011   Lab Results  Component Value Date   TRIG 198.0* 09/25/2012   Lab Results  Component Value Date   CHOLHDL 8 09/25/2012     Assessment & Plan  Depression with anxiety Increase Sertraline to 100 mg daily and may continue to use Alprazolam prn for insomnia, anxiety  HYPERLIPIDEMIA Cholesterol up. Avoid trans fats, start a statin  OBESITY Encouraged DASH diet and increase exercise.  Recurrent abdominal pain Recent loose stool improving, encouraged probiotics and fiber

## 2013-01-18 ENCOUNTER — Telehealth: Payer: Self-pay

## 2013-01-18 DIAGNOSIS — E785 Hyperlipidemia, unspecified: Secondary | ICD-10-CM

## 2013-01-18 NOTE — Telephone Encounter (Signed)
She should look at the [facility] diet and/or I can set her up with a nutritionist. Alk Phos is nonspecific and up only slightly so would not worry too much. Start Simvastatin 10 mg tabs 1 tab po qhs, disp#30 3 rf but can start with a 1/2 as needed. Recheck lipids and hepatic in 3months

## 2013-01-18 NOTE — Telephone Encounter (Signed)
Patient left a message stating that she was ok to start a low dose Simvastatin (please advise dose). Pt is wandering if its ok that alkaline phosphate is still elevated and since pt is borderline diabetic is there a particular diet for her to try or should she see a nutritionlist?   Please advise?

## 2013-01-19 MED ORDER — SIMVASTATIN 10 MG PO TABS: 10.0000 mg | ORAL_TABLET | Freq: Every day | ORAL | Status: DC

## 2013-01-19 NOTE — Telephone Encounter (Signed)
LMOVM for patient to return call. Sent rx into pharmacy.

## 2013-01-19 NOTE — Assessment & Plan Note (Signed)
Recent loose stool improving, encouraged probiotics and fiber

## 2013-01-19 NOTE — Telephone Encounter (Signed)
Detailed message left on vm. Lab order placed also

## 2013-01-19 NOTE — Assessment & Plan Note (Signed)
Encouraged DASH diet and increase exercise. 

## 2013-01-19 NOTE — Assessment & Plan Note (Signed)
Cholesterol up. Avoid trans fats, start a statin

## 2013-01-26 ENCOUNTER — Telehealth: Payer: Self-pay

## 2013-01-26 MED ORDER — ROSUVASTATIN CALCIUM 5 MG PO TABS: 5.0000 mg | ORAL_TABLET | Freq: Every day | ORAL | Status: DC

## 2013-01-26 NOTE — Telephone Encounter (Signed)
I left a message for patient on cell phone

## 2013-01-26 NOTE — Telephone Encounter (Signed)
D/c Simvastatin and call in Crestor 5 mg tab 1 tab po daily, #30 with 2 rf, put on rx that patient failed numerous statins, Simvastatin and you can find another in the chart so she needs the brand name

## 2013-01-26 NOTE — Telephone Encounter (Signed)
Pt left a message stating that she has been having some muscle aches since starting the Simvastatin. Pt would like to know if something else should be called into the pharmacy?  Please advise?

## 2013-01-27 MED ORDER — ROSUVASTATIN CALCIUM 5 MG PO TABS: 5.0000 mg | ORAL_TABLET | Freq: Every day | ORAL | Status: DC

## 2013-01-27 NOTE — Addendum Note (Signed)
Addended by: Court Joy on: 01/27/2013 09:54 AM   Modules accepted: Orders

## 2013-01-29 ENCOUNTER — Telehealth: Payer: Self-pay

## 2013-01-29 NOTE — Telephone Encounter (Signed)
Patient informed and states she doesn't think she has used either one before. Pt would like to try one but would like to know if these are daily or prn?  Please advise?

## 2013-01-29 NOTE — Telephone Encounter (Signed)
Pt left a message stating she hasn't picked up the Crestor yet but stopped the Simvastatin about 4 days ago and is still having muscle aches. Pt is wandering if it has to do with her Fibromyalgia. The pain is worse when getting to work and gets better when she gets home so she is wandering if the stress of work is causing her Fibromyalgia to flare? Pt also stated that she would like to know if there is anything new for fibromyalgia out?   Please advise?

## 2013-01-29 NOTE — Telephone Encounter (Signed)
So really they are daily, Gabapentin is cheaper but with more side effects. Which does she prefer?

## 2013-01-29 NOTE — Telephone Encounter (Signed)
So the pain sounds suspicious for fibro flare. Could try Gabapentin or Lyrica for fibromyalgia pain if she has not tried in past. She can come in and discuss options if pain persists or we can start one of these and then we can see her in follow up in near future

## 2013-01-30 NOTE — Telephone Encounter (Signed)
Left a detailed message and asked for pt to return my call on Monday and leave information on my vm if I don't answer

## 2013-02-01 MED ORDER — PREGABALIN 75 MG PO CAPS: 75.0000 mg | ORAL_CAPSULE | ORAL | Status: DC

## 2013-02-01 NOTE — Telephone Encounter (Signed)
Pt informed and voiced understanding. RX sent    Pt would like to know if she should go back on the Simvastatin since that isn't what was hurting her legs? Pt never picked up Crestor.  Please advise?

## 2013-02-01 NOTE — Telephone Encounter (Signed)
OK Start Lyrica 75 mg tabs 1 tab po qhs x 3 days then 1 tab po bid x 7 days then 2 tabs po bid, disp #120 with 1 rf and have her come in in 3-4 weeks for reevaluation. Warn her initially it can cause some sedation which is why we start slow

## 2013-02-01 NOTE — Telephone Encounter (Signed)
Pt left a message stating she would like to try the Lyrica.   Please advise?

## 2013-02-03 NOTE — Assessment & Plan Note (Signed)
Continue conservative management, as it looks like these have begun to spontaneously resolve now. No procedure needed today. Sitz baths discussed. Lidocaine + hydrocortisone gel tid.

## 2013-02-04 ENCOUNTER — Telehealth: Payer: Self-pay

## 2013-02-04 NOTE — Telephone Encounter (Signed)
So stop Lyrica but will not start anything new til she has felt back to her baseline for roughly a week. Have her call us when that is the case

## 2013-02-04 NOTE — Telephone Encounter (Signed)
Patient left a message stating that she tried the Lyrica last night (didn't take any Xanax) and woke up shortly after falling asleep screaming? Pt stated in message she just felt out of it and nauseous now.   Pt states she talked to the pharmacist and they stated that this is a side affect and should go away after a few weeks.  Pt just wants to know if MD agrees or should she stop taking the Lyrica and try something else?  Please advise?

## 2013-02-05 NOTE — Telephone Encounter (Signed)
Left a detailed message on v/m

## 2013-02-17 ENCOUNTER — Telehealth: Payer: Self-pay

## 2013-02-17 NOTE — Telephone Encounter (Signed)
She should start the Crestor as directed. She tried Cymbalta back in 2012. She might want to consider Venlafaxine it is in the same class of Cymbalta but is generic so it would be cheaper

## 2013-02-17 NOTE — Telephone Encounter (Signed)
Patient left a message stating that it has been 2 weeks since she took the 1 day of Lyrica. But pt stated that she is no longer having Fibromyalgia pains (but this could change, she stated).  Pt also said she had a reaction to simvastatin and we told her to start Crestor. Pt stated she never started due to wanting to start the Lyrica first. Pt would like to know if MD thinks she should go ahead and start the Crestor.   Pt also stated she would like to know what MD thought about her taking Cymbalta? Pt wasn't sure on message if she has taken this before but had heard it could eliminate her taking Zoloft and help with her Fibromyalgia pain/  Please advise both questions?

## 2013-02-18 NOTE — Telephone Encounter (Signed)
Left a detailed message on vm and asked pt to return our call

## 2013-02-23 NOTE — Telephone Encounter (Signed)
Patient left a vm stating that she left a message on Friday but never heard anything back from anyone?  I don't see where there is any message from Fridays date?  I left a message apologizing to patient and to return my call

## 2013-02-24 ENCOUNTER — Other Ambulatory Visit: Payer: Self-pay | Admitting: Family Medicine

## 2013-02-24 NOTE — Telephone Encounter (Signed)
Have her come in, 30 minute visit please

## 2013-02-24 NOTE — Telephone Encounter (Signed)
Patient left a vm this morning stating that she did start the Crestor on the 1st.   Pt has tried Effexor before and had bad dreams. Pt didn't know if you wanted her to try this again?  Pt mentioned in vm about trying the Cymbalta again?  Pt also stated that she has been having a lot of dizziness, SOB, and nausea (under a lot of stress). Pt didn't know if MD would want her to come in for this or just try an antidepressant first?  Please advise all questions?

## 2013-02-24 NOTE — Telephone Encounter (Signed)
Left a message for patient to return my call so we can get an appt scheduled

## 2013-02-24 NOTE — Telephone Encounter (Signed)
appt scheduled for 02-26-13 at 10 and blocked the 11:15 slot

## 2013-02-26 ENCOUNTER — Other Ambulatory Visit: Payer: Self-pay | Admitting: Family Medicine

## 2013-02-26 ENCOUNTER — Telehealth: Payer: Self-pay

## 2013-02-26 ENCOUNTER — Encounter: Payer: Self-pay | Admitting: Family Medicine

## 2013-02-26 ENCOUNTER — Ambulatory Visit (INDEPENDENT_AMBULATORY_CARE_PROVIDER_SITE_OTHER): Payer: Managed Care, Other (non HMO) | Admitting: Family Medicine

## 2013-02-26 VITALS — BP 120/82 | HR 83 | Temp 98.2°F | Ht 71.5 in | Wt 260.1 lb

## 2013-02-26 DIAGNOSIS — F341 Dysthymic disorder: Secondary | ICD-10-CM

## 2013-02-26 DIAGNOSIS — F329 Major depressive disorder, single episode, unspecified: Secondary | ICD-10-CM

## 2013-02-26 DIAGNOSIS — T7840XD Allergy, unspecified, subsequent encounter: Secondary | ICD-10-CM

## 2013-02-26 DIAGNOSIS — L509 Urticaria, unspecified: Secondary | ICD-10-CM

## 2013-02-26 DIAGNOSIS — Z5189 Encounter for other specified aftercare: Secondary | ICD-10-CM

## 2013-02-26 DIAGNOSIS — Z23 Encounter for immunization: Secondary | ICD-10-CM

## 2013-02-26 DIAGNOSIS — F418 Other specified anxiety disorders: Secondary | ICD-10-CM

## 2013-02-26 DIAGNOSIS — J309 Allergic rhinitis, unspecified: Secondary | ICD-10-CM

## 2013-02-26 DIAGNOSIS — E785 Hyperlipidemia, unspecified: Secondary | ICD-10-CM

## 2013-02-26 MED ORDER — MONTELUKAST SODIUM 10 MG PO TABS: 10.0000 mg | ORAL_TABLET | Freq: Every day | ORAL | Status: DC

## 2013-02-26 MED ORDER — TRIAMCINOLONE ACETONIDE 0.1 % EX CREA: TOPICAL_CREAM | Freq: Two times a day (BID) | CUTANEOUS | Status: DC | PRN

## 2013-02-26 MED ORDER — DULOXETINE HCL 30 MG PO CPEP: 30.0000 mg | ORAL_CAPSULE | Freq: Every day | ORAL | Status: DC

## 2013-02-26 MED ORDER — SERTRALINE HCL 25 MG PO TABS: 25.0000 mg | ORAL_TABLET | Freq: Every day | ORAL | Status: DC

## 2013-02-26 MED ORDER — DULOXETINE HCL 60 MG PO CPEP: 60.0000 mg | ORAL_CAPSULE | Freq: Every day | ORAL | Status: DC

## 2013-02-26 MED ORDER — ALPRAZOLAM 0.5 MG PO TABS: 0.5000 mg | ORAL_TABLET | Freq: Two times a day (BID) | ORAL | Status: DC | PRN

## 2013-02-26 NOTE — Patient Instructions (Addendum)

## 2013-02-26 NOTE — Telephone Encounter (Signed)
Pharmacy fax request sent to our office for patient requesting zoloft refill.

## 2013-02-26 NOTE — Telephone Encounter (Signed)
Drop the zoloft to 25 mg daily now and start the cymbalta 30 mg daily for the next 7 days then increase to 60 mg daily. Take one in am and one in pm. Does not really matter which one.

## 2013-02-26 NOTE — Telephone Encounter (Signed)
Patient left a vm stating that she and the pharmacist were confused on her directions for Cymbalta and Zoloft. Pt would like to know if its supposed to be 30 mg Cymbalta at night along with 25 mg Zoloft for 1 week then 60 mg Cymbalta with 25 mg Zoloft?  Please advise?

## 2013-02-26 NOTE — Telephone Encounter (Signed)
Pt informed

## 2013-02-28 ENCOUNTER — Encounter: Payer: Self-pay | Admitting: Family Medicine

## 2013-02-28 NOTE — Progress Notes (Signed)
Patient ID: Mallory [name], female   DOB: [birth year], 47 y.o.   MRN: [id number] Mallory [name] [id number] [birth year] 02/28/2013      Progress Note-Follow Up  Subjective  Chief Complaint  Chief Complaint  Patient presents with  . Follow-up    on depression medication    HPI  To discuss her worsening anxiety and depression. She is struggling with significant stressors at work and at home. She denies feeling in any danger but doesn't contrast enhance temper. Previously sertraline his work on her OCP but she does not feel it is helping her anxiety and depression as well now. Is interested in trying Cymbalta again. Is hoping it will help her fibromyalgia pain. She is tolerating Crestor. No increase in myalgias. No other acute complaints. No recent illness, fevers. He is struggling with worse allergies with some itching or sitting coughing and some wheezing. Notes she is reacting more to cigarette smoke and she has in the past.  Past Medical History  Diagnosis Date  . Anxiety   . Depression     no counseling  . GERD (gastroesophageal reflux disease)   . Allergy     year round  . Hyperlipidemia 2002  . IBS (irritable bowel syndrome) 2008    improved over last couple of years using Align  . Interstitial cystitis 2006    Dr Marcine Matar  . Pulmonary nodule 3/24/20101    Incidental finding of CT Chest ordered for chest pain  . Personal history of kidney stones 1986  . Asthma     mild, intermittent, allergy triggered  . Obesity   . Fibromyalgia 1997    dx by rheumatology, no recent flares  . Hiatal hernia   . NECK PAIN 12/15/2009  . GERD 02/10/2009  . DEPRESSION 02/10/2009  . Chronic laryngitis 04/26/2009  . ANXIETY 02/10/2009  . ALLERGIC RHINITIS 02/10/2009  . Pedal edema 02/11/2011  . Endometriosis 03/20/2011  . Headache(784.0) 04/30/2011  . Fasciculations of muscle 08/07/2011  . Elevated BP 08/07/2011    NO LONGER A PROBLEM AND WAS NEVER ON B/P MEDICATION  . Atypical chest pain  08/07/2011    FOLLOW UP NUCLEAR STRESS TEST 08/16/11- NORMAL  . Fatigue 03/13/2012  . Chronic cholecystitis with calculus 09/2012    cholecystectomy 10/2012  . Arthritis     PAIN AT TIMES IN BOTH KNEES-? ARTHRITIS    Past Surgical History  Procedure Laterality Date  . Dilation and curettage of uterus  2002  . Tonsillectomy    . Polypectomy      uterine  . Cholecystectomy N/A 10/28/2012    Procedure: LAPAROSCOPIC CHOLECYSTECTOMY WITH INTRAOPERATIVE CHOLANGIOGRAM;  Surgeon: Almond Lint, MD;  Location: [facility];  Service: General;  Laterality: N/A;    Family History  Problem Relation Age of Onset  . Diabetes Mother   . Hyperlipidemia Mother   . Hypertension Mother   . Breast cancer Mother   . Cancer Mother     breast/ ovarian  . Cancer Maternal Aunt     colon  . Arthritis Paternal Grandmother   . Heart disease Paternal Grandmother   . Stroke Father 13  . Migraines Sister   . Cancer Maternal Grandmother   . Cancer Maternal Grandfather     History   Social History  . Marital Status: Married    Spouse Name: N/A    Number of Children: 2  . Years of Education: N/A   Occupational History  . unemployed    Social History Main Topics  .  Smoking status: Former Smoker    Types: Cigarettes    Quit date: 06/24/1985  . Smokeless tobacco: Never Used     Comment: when a teenager  . Alcohol Use: No  . Drug Use: No  . Sexual Activity: Yes    Partners: Male   Other Topics Concern  . Not on file   Social History Narrative   Married, has one grown son and one grown daughter.   Has 2 grandchildren.   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    Current Outpatient Prescriptions on File Prior to Visit  Medication Sig Dispense Refill  . acetaminophen (TYLENOL) 500 MG tablet Take 500 mg by mouth every 6 (six) hours as needed for pain.      Marland Kitchen albuterol (PROVENTIL HFA;VENTOLIN HFA) 108 (90 BASE) MCG/ACT inhaler Inhale 2 puffs into the lungs every 6 (six) hours  as needed for wheezing.      . Cholecalciferol (VITAMIN D) 2000 UNITS tablet Take 2,000 Units by mouth every evening.      . clidinium-chlordiazePOXIDE (LIBRAX) 2.5-5 MG per capsule Take 1 capsule by mouth as needed.      . fluticasone (FLONASE) 50 MCG/ACT nasal spray Place 2 sprays into the nose daily.      Marland Kitchen levocetirizine (XYZAL) 5 MG tablet TAKE 1 TABLET BY MOUTH EVERY DAY  90 tablet  1  . Norethindrone-Ethinyl Estradiol-Fe Biphas (LO LOESTRIN FE) 1 MG-10 MCG / 10 MCG tablet Take 1 tablet by mouth daily. TAKES AT BEDTIME      . pantoprazole (PROTONIX) 40 MG tablet Take 40 mg by mouth 2 (two) times daily.      . pantoprazole (PROTONIX) 40 MG tablet TAKE 1 TABLET BY MOUTH TWICE DAILY  180 tablet  1  . pregabalin (LYRICA) 75 MG capsule Take 1 capsule (75 mg total) by mouth as directed. 1 tab po qhs X 3 days, then 1 tab po bid X 7 days, then 2 tabs po bid  120 capsule  1  . Probiotic Product (ALIGN) 4 MG CAPS Take 4 mg by mouth daily.      . promethazine (PHENERGAN) 25 MG tablet Take 25 mg by mouth every 6 (six) hours as needed for nausea.      . rosuvastatin (CRESTOR) 5 MG tablet Take 1 tablet (5 mg total) by mouth daily. Pt needs brand name. patient failed numerous statins in the past  30 tablet  2   No current facility-administered medications on file prior to visit.    Allergies  Allergen Reactions  . Simvastatin     Muscle ache in legs  . Venlafaxine     REACTION: bad dreams    Review of Systems  Review of Systems  Constitutional: Negative for fever and malaise/fatigue.  HENT: Positive for congestion.   Eyes: Negative for discharge.  Respiratory: Positive for sputum production, shortness of breath and wheezing.   Cardiovascular: Negative for chest pain, palpitations and leg swelling.  Gastrointestinal: Negative for nausea, abdominal pain and diarrhea.  Genitourinary: Negative for dysuria.  Musculoskeletal: Negative for falls.  Skin: Negative for rash.  Neurological: Negative  for loss of consciousness and headaches.  Endo/Heme/Allergies: Negative for polydipsia.  Psychiatric/Behavioral: Positive for depression. Negative for suicidal ideas. The patient is nervous/anxious. The patient does not have insomnia.     Objective  BP 120/82  Pulse 83  Temp(Src) 98.2 F (36.8 C) (Oral)  Ht 5' 11.5" (1.816 m)  Wt 260 lb 1.9 oz (117.99 kg)  BMI 35.78 kg/m2  SpO2 98%  Physical Exam  Physical Exam  Constitutional: She is oriented to person, place, and time and well-developed, well-nourished, and in no distress. No distress.  HENT:  Head: Normocephalic and atraumatic.  Eyes: Conjunctivae are normal.  Neck: Neck supple. No thyromegaly present.  Cardiovascular: Normal rate, regular rhythm and normal heart sounds.   No murmur heard. Pulmonary/Chest: Effort normal and breath sounds normal. She has no wheezes.  Abdominal: She exhibits no distension and no mass.  Musculoskeletal: She exhibits no edema.  Lymphadenopathy:    She has no cervical adenopathy.  Neurological: She is alert and oriented to person, place, and time.  Skin: Skin is warm and dry. No rash noted. She is not diaphoretic.  Psychiatric: Memory, affect and judgment normal.    Lab Results  Component Value Date   TSH 1.275 01/15/2013   Lab Results  Component Value Date   WBC 7.9 01/15/2013   HGB 13.2 01/15/2013   HCT 38.9 01/15/2013   MCV 79.6 01/15/2013   PLT 336 01/15/2013   Lab Results  Component Value Date   CREATININE 0.75 01/15/2013   BUN 13 01/15/2013   NA 139 01/15/2013   K 4.1 01/15/2013   CL 103 01/15/2013   CO2 25 01/15/2013   Lab Results  Component Value Date   ALT 12 01/15/2013   AST 15 01/15/2013   ALKPHOS 144* 01/15/2013   BILITOT 0.4 01/15/2013   Lab Results  Component Value Date   CHOL 294* 01/15/2013   Lab Results  Component Value Date   HDL 41 01/15/2013   Lab Results  Component Value Date   LDLCALC 198* 01/15/2013   Lab Results  Component Value Date   TRIG 274*  01/15/2013   Lab Results  Component Value Date   CHOLHDL 7.2 01/15/2013     Assessment & Plan  ALLERGIC RHINITIS Worse recently start Singulair and continue xyzal  HYPERLIPIDEMIA Tolerating Crestor  Depression with anxiety Drop Sertraline to 25 mg and start Cymbalta titrating up to 60 mg daily

## 2013-02-28 NOTE — Assessment & Plan Note (Signed)
Worse recently start Singulair and continue xyzal

## 2013-02-28 NOTE — Assessment & Plan Note (Signed)
Drop Sertraline to 25 mg and start Cymbalta titrating up to 60 mg daily

## 2013-02-28 NOTE — Assessment & Plan Note (Signed)
Tolerating Crestor 

## 2013-03-05 ENCOUNTER — Telehealth: Payer: Self-pay

## 2013-03-05 NOTE — Telephone Encounter (Signed)
Feel free to stop the Singulair and see if the symptoms improve

## 2013-03-05 NOTE — Telephone Encounter (Signed)
Patient left a message stating that since she has started the Cymbalta and Singulair she has a scratchy and sore throat, but more just scratchy. Pt stated she has read where the singulair can cause this? Pt states it has been for 1 week. Pt would like to know what MD thinks?  Please advise?

## 2013-03-08 NOTE — Telephone Encounter (Signed)
Left a detailed message on patients voicemail.

## 2013-03-09 ENCOUNTER — Telehealth: Payer: Self-pay | Admitting: Family Medicine

## 2013-03-09 NOTE — Telephone Encounter (Signed)
90 day request  Simvastatin 10 mg tablet

## 2013-03-09 NOTE — Telephone Encounter (Signed)
Pt switched to Crestor

## 2013-03-09 NOTE — Telephone Encounter (Signed)
Stopped Singulair on Friday and was good all weekend but throat was scratchy again on Monday.  Pt states she can't take the 60 mg Cymbalta because it makes her nauseas. Pt states she just stopped taking the Cymbalta now. I explained to pt that you don't want to just stop the medication. Pt did state she was ok on the 30 mg. I informed pt to take the 30 mg for now until she can get into see dr Abner Greenspan. Pt did say that the Cymbalta makes her stay up all night even with taking a xanax at night. I told patient to try the Cymbalta in the morning and Zoloft at night and she stated that's what she is doing.  Pt stated she would check with her work and call back to schedule an appt.

## 2013-03-17 ENCOUNTER — Ambulatory Visit (INDEPENDENT_AMBULATORY_CARE_PROVIDER_SITE_OTHER): Payer: Managed Care, Other (non HMO) | Admitting: Family Medicine

## 2013-03-17 ENCOUNTER — Telehealth: Payer: Self-pay

## 2013-03-17 ENCOUNTER — Encounter: Payer: Self-pay | Admitting: Family Medicine

## 2013-03-17 VITALS — BP 130/80 | HR 75 | Temp 98.2°F | Ht 71.5 in | Wt 263.0 lb

## 2013-03-17 DIAGNOSIS — E785 Hyperlipidemia, unspecified: Secondary | ICD-10-CM

## 2013-03-17 DIAGNOSIS — R0602 Shortness of breath: Secondary | ICD-10-CM

## 2013-03-17 DIAGNOSIS — F418 Other specified anxiety disorders: Secondary | ICD-10-CM

## 2013-03-17 DIAGNOSIS — F341 Dysthymic disorder: Secondary | ICD-10-CM

## 2013-03-17 DIAGNOSIS — R490 Dysphonia: Secondary | ICD-10-CM

## 2013-03-17 DIAGNOSIS — M797 Fibromyalgia: Secondary | ICD-10-CM

## 2013-03-17 DIAGNOSIS — Z5189 Encounter for other specified aftercare: Secondary | ICD-10-CM

## 2013-03-17 DIAGNOSIS — T7840XD Allergy, unspecified, subsequent encounter: Secondary | ICD-10-CM

## 2013-03-17 DIAGNOSIS — IMO0001 Reserved for inherently not codable concepts without codable children: Secondary | ICD-10-CM

## 2013-03-17 DIAGNOSIS — F329 Major depressive disorder, single episode, unspecified: Secondary | ICD-10-CM

## 2013-03-17 MED ORDER — DULOXETINE HCL 30 MG PO CPEP: 30.0000 mg | ORAL_CAPSULE | Freq: Every day | ORAL | Status: DC

## 2013-03-17 NOTE — Telephone Encounter (Signed)
Patient left a message stating that she needs clarification on whether she is to take 25 mg or 50 mg of Zoloft with the Celexa?   Please advise?

## 2013-03-17 NOTE — Patient Instructions (Addendum)

## 2013-03-17 NOTE — Telephone Encounter (Signed)
50 mg

## 2013-03-18 ENCOUNTER — Ambulatory Visit: Payer: Managed Care, Other (non HMO) | Admitting: Family Medicine

## 2013-03-18 NOTE — Telephone Encounter (Signed)
Patient informed and voiced understanding

## 2013-03-21 ENCOUNTER — Encounter: Payer: Self-pay | Admitting: Family Medicine

## 2013-03-21 NOTE — Assessment & Plan Note (Signed)
Tolerating low dose Crestor, no changes

## 2013-03-21 NOTE — Assessment & Plan Note (Addendum)
Tolerated Cymbalta at 30 mg only some insomnia but 60 mg was not tolerated. Will remain on 30 mg with Sertraline at 50 mg and see if we can improve her anxiety and her pain at the same time. OCD responds well to the Sertraline

## 2013-03-21 NOTE — Progress Notes (Signed)
Patient ID: Mallory [name], female   DOB: 08-23-1965, 47 y.o.   MRN: [id number] Mallory [name] [id number] 1966-03-29 03/21/2013      Progress Note-Follow Up  Subjective  Chief Complaint  Chief Complaint  Patient presents with  . Follow-up    HPI  Patient is a 47 year old Caucasian female who is in today to reevaluate her medications. We tried titrating down her sertraline to 25 and titrating up Cymbalta but she did not tolerate this. High dose Cymbalta caused excess of nausea. Low dose Cymbalta does cause some mild issues with insomnia intolerable nausea. She continues to struggle with chronic pain diffusely as well as hiking because of anxiety and depression. She does not want to stop sertraline altogether because it helps with her OCD symptoms better than anything else. She continues to tolerate Crestor and does not feel that is making her myalgias worse. No chest pain or palpitations. No shortness of breath GI or GU concerns noted today.  Past Medical History  Diagnosis Date  . Anxiety   . Depression     no counseling  . GERD (gastroesophageal reflux disease)   . Allergy     year round  . Hyperlipidemia 2002  . IBS (irritable bowel syndrome) 2008    improved over last couple of years using Align  . Interstitial cystitis 2006    Dr Marcine Matar  . Pulmonary nodule 3/24/20101    Incidental finding of CT Chest ordered for chest pain  . Personal history of kidney stones 1986  . Asthma     mild, intermittent, allergy triggered  . Obesity   . Fibromyalgia 1997    dx by rheumatology, no recent flares  . Hiatal hernia   . NECK PAIN 12/15/2009  . GERD 02/10/2009  . DEPRESSION 02/10/2009  . Chronic laryngitis 04/26/2009  . ANXIETY 02/10/2009  . ALLERGIC RHINITIS 02/10/2009  . Pedal edema 02/11/2011  . Endometriosis 03/20/2011  . Headache(784.0) 04/30/2011  . Fasciculations of muscle 08/07/2011  . Elevated BP 08/07/2011    NO LONGER A PROBLEM AND WAS NEVER ON B/P MEDICATION  .  Atypical chest pain 08/07/2011    FOLLOW UP NUCLEAR STRESS TEST 08/16/11- NORMAL  . Fatigue 03/13/2012  . Chronic cholecystitis with calculus 09/2012    cholecystectomy 10/2012  . Arthritis     PAIN AT TIMES IN BOTH KNEES-? ARTHRITIS    Past Surgical History  Procedure Laterality Date  . Dilation and curettage of uterus  2002  . Tonsillectomy    . Polypectomy      uterine  . Cholecystectomy N/A 10/28/2012    Procedure: LAPAROSCOPIC CHOLECYSTECTOMY WITH INTRAOPERATIVE CHOLANGIOGRAM;  Surgeon: Almond Lint, MD;  Location: [facility];  Service: General;  Laterality: N/A;    Family History  Problem Relation Age of Onset  . Diabetes Mother   . Hyperlipidemia Mother   . Hypertension Mother   . Breast cancer Mother   . Cancer Mother     breast/ ovarian  . Cancer Maternal Aunt     colon  . Arthritis Paternal Grandmother   . Heart disease Paternal Grandmother   . Stroke Father 52  . Migraines Sister   . Cancer Maternal Grandmother   . Cancer Maternal Grandfather     History   Social History  . Marital Status: Married    Spouse Name: N/A    Number of Children: 2  . Years of Education: N/A   Occupational History  . unemployed    Social History Main Topics  .  Smoking status: Former Smoker    Types: Cigarettes    Quit date: 06/24/1985  . Smokeless tobacco: Never Used     Comment: when a teenager  . Alcohol Use: No  . Drug Use: No  . Sexual Activity: Yes    Partners: Male   Other Topics Concern  . Not on file   Social History Narrative   Married, has one grown son and one grown daughter.   Has 2 grandchildren.   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    Current Outpatient Prescriptions on File Prior to Visit  Medication Sig Dispense Refill  . acetaminophen (TYLENOL) 500 MG tablet Take 500 mg by mouth every 6 (six) hours as needed for pain.      Marland Kitchen albuterol (PROVENTIL HFA;VENTOLIN HFA) 108 (90 BASE) MCG/ACT inhaler Inhale 2 puffs into the lungs  every 6 (six) hours as needed for wheezing.      Marland Kitchen ALPRAZolam (XANAX) 0.5 MG tablet Take 1 tablet (0.5 mg total) by mouth 2 (two) times daily as needed for sleep or anxiety.  60 tablet  2  . Cholecalciferol (VITAMIN D) 2000 UNITS tablet Take 2,000 Units by mouth every evening.      . clidinium-chlordiazePOXIDE (LIBRAX) 2.5-5 MG per capsule Take 1 capsule by mouth as needed.      . fluticasone (FLONASE) 50 MCG/ACT nasal spray Place 2 sprays into the nose daily.      Marland Kitchen levocetirizine (XYZAL) 5 MG tablet TAKE 1 TABLET BY MOUTH EVERY DAY  90 tablet  1  . montelukast (SINGULAIR) 10 MG tablet Take 1 tablet (10 mg total) by mouth at bedtime.  30 tablet  3  . Norethindrone-Ethinyl Estradiol-Fe Biphas (LO LOESTRIN FE) 1 MG-10 MCG / 10 MCG tablet Take 1 tablet by mouth daily. TAKES AT BEDTIME      . pantoprazole (PROTONIX) 40 MG tablet TAKE 1 TABLET BY MOUTH TWICE DAILY  180 tablet  1  . Probiotic Product (ALIGN) 4 MG CAPS Take 4 mg by mouth daily.      . promethazine (PHENERGAN) 25 MG tablet Take 25 mg by mouth every 6 (six) hours as needed for nausea.      . rosuvastatin (CRESTOR) 5 MG tablet Take 1 tablet (5 mg total) by mouth daily. Pt needs brand name. patient failed numerous statins in the past  30 tablet  2  . triamcinolone cream (KENALOG) 0.1 % Apply topically 2 (two) times daily as needed. urticaria  85.2 g  1   No current facility-administered medications on file prior to visit.    Allergies  Allergen Reactions  . Simvastatin     Muscle ache in legs  . Venlafaxine     REACTION: bad dreams    Review of Systems  Review of Systems  Constitutional: Negative for fever and malaise/fatigue.  HENT: Negative for congestion.   Eyes: Negative for discharge.  Respiratory: Negative for shortness of breath.   Cardiovascular: Negative for chest pain, palpitations and leg swelling.  Gastrointestinal: Positive for nausea. Negative for vomiting, abdominal pain and diarrhea.  Genitourinary: Negative  for dysuria.  Musculoskeletal: Positive for myalgias and joint pain. Negative for falls.  Skin: Negative for rash.  Neurological: Negative for loss of consciousness and headaches.  Endo/Heme/Allergies: Negative for polydipsia.  Psychiatric/Behavioral: Positive for depression. Negative for suicidal ideas. The patient is nervous/anxious. The patient does not have insomnia.     Objective  BP 130/80  Pulse 75  Temp(Src) 98.2 F (36.8  C) (Oral)  Ht 5' 11.5" (1.816 m)  Wt 263 lb (119.296 kg)  BMI 36.17 kg/m2  SpO2 97%  Physical Exam  Physical Exam  Constitutional: She is oriented to person, place, and time and well-developed, well-nourished, and in no distress. No distress.  HENT:  Head: Normocephalic and atraumatic.  Eyes: Conjunctivae are normal.  Neck: Neck supple. No thyromegaly present.  Cardiovascular: Normal rate, regular rhythm and normal heart sounds.   No murmur heard. Pulmonary/Chest: Effort normal and breath sounds normal. She has no wheezes.  Abdominal: She exhibits no distension and no mass.  Musculoskeletal: She exhibits no edema.  Lymphadenopathy:    She has no cervical adenopathy.  Neurological: She is alert and oriented to person, place, and time.  Skin: Skin is warm and dry. No rash noted. She is not diaphoretic.  Psychiatric: Memory, affect and judgment normal.    Lab Results  Component Value Date   TSH 1.275 01/15/2013   Lab Results  Component Value Date   WBC 7.9 01/15/2013   HGB 13.2 01/15/2013   HCT 38.9 01/15/2013   MCV 79.6 01/15/2013   PLT 336 01/15/2013   Lab Results  Component Value Date   CREATININE 0.75 01/15/2013   BUN 13 01/15/2013   NA 139 01/15/2013   K 4.1 01/15/2013   CL 103 01/15/2013   CO2 25 01/15/2013   Lab Results  Component Value Date   ALT 12 01/15/2013   AST 15 01/15/2013   ALKPHOS 144* 01/15/2013   BILITOT 0.4 01/15/2013   Lab Results  Component Value Date   CHOL 294* 01/15/2013   Lab Results  Component Value Date   HDL  41 01/15/2013   Lab Results  Component Value Date   LDLCALC 198* 01/15/2013   Lab Results  Component Value Date   TRIG 274* 01/15/2013   Lab Results  Component Value Date   CHOLHDL 7.2 01/15/2013     Assessment & Plan  HYPERLIPIDEMIA Tolerating low dose Crestor, no changes  Depression with anxiety Tolerated Cymbalta at 30 mg only some insomnia but 60 mg was not tolerated. Will remain on 30 mg with Sertraline at 50 mg and see if we can improve her anxiety and her pain at the same time. OCD responds well to the Sertraline

## 2013-03-23 ENCOUNTER — Ambulatory Visit: Payer: Managed Care, Other (non HMO) | Admitting: Family Medicine

## 2013-04-12 ENCOUNTER — Telehealth: Payer: Self-pay | Admitting: Family Medicine

## 2013-04-12 DIAGNOSIS — M797 Fibromyalgia: Secondary | ICD-10-CM

## 2013-04-12 DIAGNOSIS — F329 Major depressive disorder, single episode, unspecified: Secondary | ICD-10-CM

## 2013-04-12 MED ORDER — DULOXETINE HCL 30 MG PO CPEP: 30.0000 mg | ORAL_CAPSULE | Freq: Every day | ORAL | Status: DC

## 2013-04-12 NOTE — Telephone Encounter (Signed)
90 day request  duloxetine hcl dr 30mg  cap

## 2013-04-16 ENCOUNTER — Telehealth: Payer: Self-pay | Admitting: *Deleted

## 2013-04-16 ENCOUNTER — Other Ambulatory Visit: Payer: Self-pay | Admitting: *Deleted

## 2013-04-16 DIAGNOSIS — T7840XD Allergy, unspecified, subsequent encounter: Secondary | ICD-10-CM

## 2013-04-16 MED ORDER — MONTELUKAST SODIUM 10 MG PO TABS: 10.0000 mg | ORAL_TABLET | Freq: Every day | ORAL | Status: DC

## 2013-04-16 NOTE — Progress Notes (Signed)
Pharmacy fax request for 90-day supply; reordered/SLS

## 2013-04-16 NOTE — Telephone Encounter (Signed)
Faxed refill request received from pharmacy for Sertraline 25 mg Last filled by MD on: shows D/C on 09.24.14 at [facility], possibly increased to 50 mg per 09.24.14 phone note Last [facility] - 09.24.14 Please Advise/SLS

## 2013-04-17 NOTE — Telephone Encounter (Signed)
Please clarify with patient her dosing and make her a follow up appt. Once we know what she is taking am willing to give her a month supply as long as she has appt before that is done

## 2013-04-20 MED ORDER — SERTRALINE HCL 25 MG PO TABS: 25.0000 mg | ORAL_TABLET | Freq: Every day | ORAL | Status: DC

## 2013-04-20 NOTE — Telephone Encounter (Signed)
Left message on voicemail for pt to call with clarification of current dosage. Looks like pt has appt on 05/17/13 and will not need earlier appt.

## 2013-04-20 NOTE — Telephone Encounter (Signed)
OK to change Sertraline to 25 mg tabs 1 tab po daily #30 with 2 rf or 90 no refill

## 2013-04-20 NOTE — Telephone Encounter (Signed)
Spoke with pt and she verified that she is currently taking 50mg  of sertraline.  Current tabs are 100mg  and she is taking 1/2 tablet daily. Pt states she was unable to take Cymbalta along with sertraline 50mg  because she didn't like the way she felt. Pt states it is difficult to cut the tablets in fourths to be able to try 25mg  sertraline with the Cymbalta and pt is requesting rx for sertraline 25mg  1 tablet daily. Please advise.

## 2013-04-21 NOTE — Telephone Encounter (Signed)
Patient called back regarding this. Best # [phone]

## 2013-04-21 NOTE — Telephone Encounter (Signed)
Notified pt, rx was sent in yesterday.

## 2013-05-04 ENCOUNTER — Ambulatory Visit: Payer: Managed Care, Other (non HMO) | Admitting: Family Medicine

## 2013-05-04 ENCOUNTER — Ambulatory Visit (INDEPENDENT_AMBULATORY_CARE_PROVIDER_SITE_OTHER): Payer: Managed Care, Other (non HMO) | Admitting: Family Medicine

## 2013-05-04 ENCOUNTER — Encounter: Payer: Self-pay | Admitting: Family Medicine

## 2013-05-04 VITALS — BP 128/82 | HR 86 | Temp 98.1°F | Resp 16 | Ht 71.5 in | Wt 265.8 lb

## 2013-05-04 DIAGNOSIS — F341 Dysthymic disorder: Secondary | ICD-10-CM

## 2013-05-04 DIAGNOSIS — J309 Allergic rhinitis, unspecified: Secondary | ICD-10-CM

## 2013-05-04 DIAGNOSIS — I8390 Asymptomatic varicose veins of unspecified lower extremity: Secondary | ICD-10-CM

## 2013-05-04 DIAGNOSIS — E669 Obesity, unspecified: Secondary | ICD-10-CM

## 2013-05-04 DIAGNOSIS — F418 Other specified anxiety disorders: Secondary | ICD-10-CM

## 2013-05-04 NOTE — Patient Instructions (Addendum)
64 oz of clear fluids. Gatorade 4-6 oz daily Jobst stockings light weight on in am off pm 2 Tums at bedtime Calciu, magnesium, zinc tab helps muscle cramps   Edema Edema is an abnormal build-up of fluids in tissues. Because this is partly dependent on gravity (water flows to the lowest place), it is more common in the legs and thighs (lower extremities). It is also common in the looser tissues, like around the eyes. Painless swelling of the feet and ankles is common and increases as a person ages. It may affect both legs and may include the calves or even thighs. When squeezed, the fluid may move out of the affected area and may leave a dent for a few moments. CAUSES   Prolonged standing or sitting in one place for extended periods of time. Movement helps pump tissue fluid into the veins, and absence of movement prevents this, resulting in edema.  Varicose veins. The valves in the veins do not work as well as they should. This causes fluid to leak into the tissues.  Fluid and salt overload.  Injury, burn, or surgery to the leg, ankle, or foot, may damage veins and allow fluid to leak out.  Sunburn damages vessels. Leaky vessels allow fluid to go out into the sunburned tissues.  Allergies (from insect bites or stings, medications or chemicals) cause swelling by allowing vessels to become leaky.  Protein in the blood helps keep fluid in your vessels. Low protein, as in malnutrition, allows fluid to leak out.  Hormonal changes, including pregnancy and menstruation, cause fluid retention. This fluid may leak out of vessels and cause edema.  Medications that cause fluid retention. Examples are sex hormones, blood pressure medications, steroid treatment, or anti-depressants.  Some illnesses cause edema, especially heart failure, kidney disease, or liver disease.  Surgery that cuts veins or lymph nodes, such as surgery done for the heart or for breast cancer, may result in edema. DIAGNOSIS   Your caregiver is usually easily able to determine what is causing your swelling (edema) by simply asking what is wrong (getting a history) and examining you (doing a physical). Sometimes x-rays, EKG (electrocardiogram or heart tracing), and blood work may be done to evaluate for underlying medical illness. TREATMENT  General treatment includes:  Leg elevation (or elevation of the affected body part).  Restriction of fluid intake.  Prevention of fluid overload.  Compression of the affected body part. Compression with elastic bandages or support stockings squeezes the tissues, preventing fluid from entering and forcing it back into the blood vessels.  Diuretics (also called water pills or fluid pills) pull fluid out of your body in the form of increased urination. These are effective in reducing the swelling, but can have side effects and must be used only under your caregiver's supervision. Diuretics are appropriate only for some types of edema. The specific treatment can be directed at any underlying causes discovered. Heart, liver, or kidney disease should be treated appropriately. HOME CARE INSTRUCTIONS   Elevate the legs (or affected body part) above the level of the heart, while lying down.  Avoid sitting or standing still for prolonged periods of time.  Avoid putting anything directly under the knees when lying down, and do not wear constricting clothing or garters on the upper legs.  Exercising the legs causes the fluid to work back into the veins and lymphatic channels. This may help the swelling go down.  The pressure applied by elastic bandages or support stockings can help reduce  ankle swelling.  A low-salt diet may help reduce fluid retention and decrease the ankle swelling.  Take any medications exactly as prescribed. SEEK MEDICAL CARE IF:  Your edema is not responding to recommended treatments. SEEK IMMEDIATE MEDICAL CARE IF:   You develop shortness of breath or chest  pain.  You cannot breathe when you lay down; or if, while lying down, you have to get up and go to the window to get your breath.  You are having increasing swelling without relief from treatment.  You develop a fever over 102 F (38.9 C).  You develop pain or redness in the areas that are swollen.  Tell your caregiver right away if you have gained 03 lb/1.4 kg in 1 day or 05 lb/2.3 kg in a week. MAKE SURE YOU:   Understand these instructions.  Will watch your condition.  Will get help right away if you are not doing well or get worse. Document Released: 06/10/2005 Document Revised: 12/10/2011 Document Reviewed: 01/27/2008 [facility] Patient Information 2014 [place], [place].

## 2013-05-04 NOTE — Progress Notes (Signed)
Pre visit review using our clinic review tool, if applicable. No additional management support is needed unless otherwise documented below in the visit note/SLS  

## 2013-05-09 ENCOUNTER — Encounter: Payer: Self-pay | Admitting: Family Medicine

## 2013-05-09 NOTE — Assessment & Plan Note (Signed)
Encouraged DASH diet and increased exercise. Did over do exercise this past week after not exercising for a long time. Encouraged to start regular exercise but to build up the amount and intensity of it more slowly.

## 2013-05-09 NOTE — Assessment & Plan Note (Signed)
Quit Cymbalta due to nausea and went back to Sertraline which she feels works best for her

## 2013-05-09 NOTE — Assessment & Plan Note (Signed)
Recent bruising over some varicose veins that ruptured in her lle, improving. Encouraged weight loss, compression hose, minimize sodium, consider referral to vascular if worsens.

## 2013-05-09 NOTE — Assessment & Plan Note (Signed)
Stopped Singulair thinking it contributed to some mild nausea. She is encouraged to try again if allergies worsen

## 2013-05-09 NOTE — Progress Notes (Signed)
Patient ID: Mallory [name], female   DOB: 05/30/1966, 47 y.o.   MRN: [id number] Mallory [name] [id number] February 08, 1966 05/09/2013      Progress Note-Follow Up  Subjective  Chief Complaint  Chief Complaint  Patient presents with  . Varicose Veins    Pt c/o bruising & swelling, tender to touch at vericose [spider] veins in lower left leg x4 days    HPI  Patient is a 47 year old Caucasian female who is in today with numerous concerns. She notes she recently returned overzealously to exercise and has had some myalgias and arthralgias as a result. Ruptured varicosity in her left lower extremity with significant bruising but that is resolving. Denies chest pain, palpitations or shortness of breath. Acknowledges anxiety and depression but did switch from Cymbalta back to Zoloft do to nausea and nausea improved. Stop Singulair also thinking that was contributing to nausea.  Past Medical History  Diagnosis Date  . Anxiety   . Depression     no counseling  . GERD (gastroesophageal reflux disease)   . Allergy     year round  . Hyperlipidemia 2002  . IBS (irritable bowel syndrome) 2008    improved over last couple of years using Align  . Interstitial cystitis 2006    Dr Marcine Matar  . Pulmonary nodule 3/24/20101    Incidental finding of CT Chest ordered for chest pain  . Personal history of kidney stones 1986  . Asthma     mild, intermittent, allergy triggered  . Obesity   . Fibromyalgia 1997    dx by rheumatology, no recent flares  . Hiatal hernia   . NECK PAIN 12/15/2009  . GERD 02/10/2009  . DEPRESSION 02/10/2009  . Chronic laryngitis 04/26/2009  . ANXIETY 02/10/2009  . ALLERGIC RHINITIS 02/10/2009  . Pedal edema 02/11/2011  . Endometriosis 03/20/2011  . Headache(784.0) 04/30/2011  . Fasciculations of muscle 08/07/2011  . Elevated BP 08/07/2011    NO LONGER A PROBLEM AND WAS NEVER ON B/P MEDICATION  . Atypical chest pain 08/07/2011    FOLLOW UP NUCLEAR STRESS TEST 08/16/11- NORMAL  .  Fatigue 03/13/2012  . Chronic cholecystitis with calculus 09/2012    cholecystectomy 10/2012  . Arthritis     PAIN AT TIMES IN BOTH KNEES-? ARTHRITIS  . Varicose veins 05/09/2013    Past Surgical History  Procedure Laterality Date  . Dilation and curettage of uterus  2002  . Tonsillectomy    . Polypectomy      uterine  . Cholecystectomy N/A 10/28/2012    Procedure: LAPAROSCOPIC CHOLECYSTECTOMY WITH INTRAOPERATIVE CHOLANGIOGRAM;  Surgeon: Almond Lint, MD;  Location: [facility];  Service: General;  Laterality: N/A;    Family History  Problem Relation Age of Onset  . Diabetes Mother   . Hyperlipidemia Mother   . Hypertension Mother   . Breast cancer Mother   . Cancer Mother     breast/ ovarian  . Cancer Maternal Aunt     colon  . Arthritis Paternal Grandmother   . Heart disease Paternal Grandmother   . Stroke Father 7  . Migraines Sister   . Cancer Maternal Grandmother   . Cancer Maternal Grandfather     History   Social History  . Marital Status: Married    Spouse Name: N/A    Number of Children: 2  . Years of Education: N/A   Occupational History  . unemployed    Social History Main Topics  . Smoking status: Former Smoker    Types:  Cigarettes    Quit date: 06/24/1985  . Smokeless tobacco: Never Used     Comment: when a teenager  . Alcohol Use: No  . Drug Use: No  . Sexual Activity: Yes    Partners: Male   Other Topics Concern  . Not on file   Social History Narrative   Married, has one grown son and one grown daughter.   Has 2 grandchildren.   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    Current Outpatient Prescriptions on File Prior to Visit  Medication Sig Dispense Refill  . acetaminophen (TYLENOL) 500 MG tablet Take 500 mg by mouth every 6 (six) hours as needed for pain.      Marland Kitchen albuterol (PROVENTIL HFA;VENTOLIN HFA) 108 (90 BASE) MCG/ACT inhaler Inhale 2 puffs into the lungs every 6 (six) hours as needed for wheezing.      Marland Kitchen  ALPRAZolam (XANAX) 0.5 MG tablet Take 1 tablet (0.5 mg total) by mouth 2 (two) times daily as needed for sleep or anxiety.  60 tablet  2  . Cholecalciferol (VITAMIN D) 2000 UNITS tablet Take 2,000 Units by mouth every evening.      . clidinium-chlordiazePOXIDE (LIBRAX) 2.5-5 MG per capsule Take 1 capsule by mouth as needed.      . fluticasone (FLONASE) 50 MCG/ACT nasal spray Place 2 sprays into the nose daily.      Marland Kitchen levocetirizine (XYZAL) 5 MG tablet TAKE 1 TABLET BY MOUTH EVERY DAY  90 tablet  1  . Norethindrone-Ethinyl Estradiol-Fe Biphas (LO LOESTRIN FE) 1 MG-10 MCG / 10 MCG tablet Take 1 tablet by mouth daily. TAKES AT BEDTIME      . pantoprazole (PROTONIX) 40 MG tablet TAKE 1 TABLET BY MOUTH TWICE DAILY  180 tablet  1  . Probiotic Product (ALIGN) 4 MG CAPS Take 4 mg by mouth daily.      . promethazine (PHENERGAN) 25 MG tablet Take 25 mg by mouth every 6 (six) hours as needed for nausea.      Marland Kitchen triamcinolone cream (KENALOG) 0.1 % Apply topically 2 (two) times daily as needed. urticaria  85.2 g  1   No current facility-administered medications on file prior to visit.    Allergies  Allergen Reactions  . Cymbalta [Duloxetine Hcl] Nausea Only    Nausea & Fatigue  . Simvastatin     Muscle ache in legs  . Venlafaxine     REACTION: bad dreams    Review of Systems  Review of Systems  Constitutional: Positive for malaise/fatigue. Negative for fever.  HENT: Negative for congestion.   Eyes: Negative for discharge.  Respiratory: Negative for shortness of breath.   Cardiovascular: Negative for chest pain, palpitations and leg swelling.  Gastrointestinal: Positive for nausea. Negative for abdominal pain and diarrhea.  Genitourinary: Negative for dysuria.  Musculoskeletal: Negative for falls.  Skin: Negative for rash.  Neurological: Negative for loss of consciousness and headaches.  Endo/Heme/Allergies: Negative for polydipsia. Bruises/bleeds easily.  Psychiatric/Behavioral: Positive for  depression. Negative for suicidal ideas. The patient is nervous/anxious. The patient does not have insomnia.     Objective  BP 128/82  Pulse 86  Temp(Src) 98.1 F (36.7 C) (Oral)  Resp 16  Ht 5' 11.5" (1.816 m)  Wt 265 lb 12 oz (120.543 kg)  BMI 36.55 kg/m2  SpO2 98%  Physical Exam  Physical Exam  Constitutional: She is oriented to person, place, and time and well-developed, well-nourished, and in no distress. No distress.  HENT:  Head: Normocephalic and atraumatic.  Eyes: Conjunctivae are normal.  Neck: Neck supple. No thyromegaly present.  Cardiovascular: Normal rate, regular rhythm and normal heart sounds.   No murmur heard. Pulmonary/Chest: Effort normal and breath sounds normal. She has no wheezes.  Abdominal: She exhibits no distension and no mass.  Musculoskeletal: She exhibits no edema.  Varicose veins noted b/l lower extremities. Slight bruising over varicosity LLE just below knee medially, NT and not erythematous or warm  Lymphadenopathy:    She has no cervical adenopathy.  Neurological: She is alert and oriented to person, place, and time.  Skin: Skin is warm and dry. No rash noted. She is not diaphoretic.  Psychiatric: Memory, affect and judgment normal.    Lab Results  Component Value Date   TSH 1.275 01/15/2013   Lab Results  Component Value Date   WBC 7.9 01/15/2013   HGB 13.2 01/15/2013   HCT 38.9 01/15/2013   MCV 79.6 01/15/2013   PLT 336 01/15/2013   Lab Results  Component Value Date   CREATININE 0.75 01/15/2013   BUN 13 01/15/2013   NA 139 01/15/2013   K 4.1 01/15/2013   CL 103 01/15/2013   CO2 25 01/15/2013   Lab Results  Component Value Date   ALT 12 01/15/2013   AST 15 01/15/2013   ALKPHOS 144* 01/15/2013   BILITOT 0.4 01/15/2013   Lab Results  Component Value Date   CHOL 294* 01/15/2013   Lab Results  Component Value Date   HDL 41 01/15/2013   Lab Results  Component Value Date   LDLCALC 198* 01/15/2013   Lab Results  Component Value  Date   TRIG 274* 01/15/2013   Lab Results  Component Value Date   CHOLHDL 7.2 01/15/2013     Assessment & Plan  Varicose veins Recent bruising over some varicose veins that ruptured in her lle, improving. Encouraged weight loss, compression hose, minimize sodium, consider referral to vascular if worsens.   ALLERGIC RHINITIS Stopped Singulair thinking it contributed to some mild nausea. She is encouraged to try again if allergies worsen  OBESITY Encouraged DASH diet and increased exercise. Did over do exercise this past week after not exercising for a long time. Encouraged to start regular exercise but to build up the amount and intensity of it more slowly.   Depression with anxiety Quit Cymbalta due to nausea and went back to Sertraline which she feels works best for her

## 2013-05-17 ENCOUNTER — Ambulatory Visit: Payer: Managed Care, Other (non HMO) | Admitting: Family Medicine

## 2013-05-28 ENCOUNTER — Telehealth: Payer: Self-pay | Admitting: *Deleted

## 2013-05-28 MED ORDER — FLUTICASONE PROPIONATE 50 MCG/ACT NA SUSP: 2.0000 | Freq: Every day | NASAL | Status: DC

## 2013-05-28 NOTE — Telephone Encounter (Signed)
OK to fill  Fluticasone nasal 1 spray each nostril daily prn allergies. Disp #1 with 3 rf

## 2013-05-28 NOTE — Telephone Encounter (Signed)
Refill request for Fluticasone Last filled by MD on - never Last Appt- 05/04/13 Next Appt- none Please advise refill?

## 2013-05-31 ENCOUNTER — Other Ambulatory Visit: Payer: Self-pay | Admitting: Family Medicine

## 2013-05-31 ENCOUNTER — Telehealth: Payer: Self-pay

## 2013-05-31 DIAGNOSIS — E669 Obesity, unspecified: Secondary | ICD-10-CM

## 2013-05-31 NOTE — Telephone Encounter (Signed)
Patient left a message stating that her and the MD discussed being referred to a dietician or nutritionalist? Pt would like to proceed with this referral.  Pt called and left a second message stating that people have been telling her that she should get a food sensitivity test done? Pt would like to know what MD thinks about this and if she agrees to put in a referral for this also?  Please advise?

## 2013-05-31 NOTE — Telephone Encounter (Signed)
So I have put in referral for nutritionist if she wants food sensitivity testing I would have to refer her to an allergist, does she want me to refer her their also now or wait til done with nutritionist?

## 2013-06-01 NOTE — Telephone Encounter (Signed)
Notified pt. She is agreeable to proceed with allergist referral at this time.

## 2013-06-02 ENCOUNTER — Other Ambulatory Visit: Payer: Self-pay | Admitting: Family Medicine

## 2013-06-02 DIAGNOSIS — T7840XD Allergy, unspecified, subsequent encounter: Secondary | ICD-10-CM

## 2013-06-02 DIAGNOSIS — J329 Chronic sinusitis, unspecified: Secondary | ICD-10-CM

## 2013-06-02 NOTE — Telephone Encounter (Signed)
done

## 2013-06-04 ENCOUNTER — Telehealth: Payer: Self-pay

## 2013-06-04 ENCOUNTER — Other Ambulatory Visit: Payer: Self-pay | Admitting: Family Medicine

## 2013-06-04 DIAGNOSIS — E669 Obesity, unspecified: Secondary | ICD-10-CM

## 2013-06-04 NOTE — Telephone Encounter (Signed)
Patient called today stating that she would like a referral for a Nutritionaist? Pt stated someone called her about an appt for a food allergist but not the Nutritionalist?  Please advise?

## 2013-06-08 ENCOUNTER — Telehealth: Payer: Self-pay | Admitting: Family Medicine

## 2013-06-08 ENCOUNTER — Other Ambulatory Visit: Payer: Self-pay | Admitting: Family Medicine

## 2013-06-08 ENCOUNTER — Encounter: Payer: Self-pay | Admitting: Family Medicine

## 2013-06-08 DIAGNOSIS — B001 Herpesviral vesicular dermatitis: Secondary | ICD-10-CM

## 2013-06-08 MED ORDER — ACYCLOVIR 400 MG PO TABS: 400.0000 mg | ORAL_TABLET | Freq: Every day | ORAL | Status: DC

## 2013-06-08 NOTE — Telephone Encounter (Signed)
Please advise 

## 2013-06-08 NOTE — Telephone Encounter (Signed)
PATIENT STATES SHE WAS GIVEN A PILL TO TAKE BY DR Abner Greenspan THE LAST TIME SHE HAD FEVER BLISTER/COLD SORES.  SHE HAS THE RX CREAM BUT THINKS THE PILLS MADE THEM GO AWAY FASTER.  WOULD LIKE THAT PILL CALLED IN

## 2013-06-09 ENCOUNTER — Other Ambulatory Visit: Payer: Self-pay | Admitting: Family Medicine

## 2013-06-09 MED ORDER — PANTOPRAZOLE SODIUM 40 MG PO TBEC: DELAYED_RELEASE_TABLET | ORAL | Status: DC

## 2013-06-09 NOTE — Telephone Encounter (Signed)
Refill sent.

## 2013-06-09 NOTE — Telephone Encounter (Signed)
Patient's pharmacy requesting pantoprazole 40mg .

## 2013-06-10 NOTE — Telephone Encounter (Signed)
Did this get ordered?

## 2013-06-10 NOTE — Telephone Encounter (Signed)
I placed one referral on 12/10 and one on 12/12 she should hear something soon, may need to check with referral coordinator

## 2013-06-23 NOTE — Telephone Encounter (Signed)
Close Encounter 

## 2013-06-25 ENCOUNTER — Telehealth: Payer: Self-pay | Admitting: Family Medicine

## 2013-06-25 NOTE — Telephone Encounter (Signed)
Patient would like to know if she needs lab work for mid January appointment? I looked and did not see anything about labs but wanted to make sure.

## 2013-06-28 ENCOUNTER — Telehealth: Payer: Self-pay | Admitting: Family Medicine

## 2013-06-28 DIAGNOSIS — E785 Hyperlipidemia, unspecified: Secondary | ICD-10-CM

## 2013-06-28 DIAGNOSIS — R739 Hyperglycemia, unspecified: Secondary | ICD-10-CM

## 2013-06-28 NOTE — Telephone Encounter (Signed)
Needs lipid, renal, cbc, tsh, hepatic and hgba1c prior to visit if able so I have results but if she cannot get in we will do at visit.  Hyperglycemia, hi cholesterol.

## 2013-06-28 NOTE — Telephone Encounter (Signed)
Request refill on Fluticasone Prop 50 mcg spray Use 2 sprays into both nostrils daily Last fill date was 05/28/13, pharmacy states he wants it written for a 90day supply

## 2013-06-28 NOTE — Telephone Encounter (Signed)
Please advise?  Pt was advised to return for labs in Oct 29,14 for lipid and hepatic. Do you want pt to come in for these or just wait for appt?

## 2013-06-29 MED ORDER — FLUTICASONE PROPIONATE 50 MCG/ACT NA SUSP: 2.0000 | Freq: Every day | NASAL | Status: DC

## 2013-07-02 LAB — RENAL FUNCTION PANEL
ALBUMIN: 3.6 g/dL (ref 3.5–5.2)
BUN: 15 mg/dL (ref 6–23)
CALCIUM: 9.2 mg/dL (ref 8.4–10.5)
CO2: 24 mEq/L (ref 19–32)
CREATININE: 0.91 mg/dL (ref 0.50–1.10)
Chloride: 105 mEq/L (ref 96–112)
GLUCOSE: 86 mg/dL (ref 70–99)
Phosphorus: 2.9 mg/dL (ref 2.3–4.6)
Potassium: 4.4 mEq/L (ref 3.5–5.3)
SODIUM: 140 meq/L (ref 135–145)

## 2013-07-02 LAB — CBC
HEMATOCRIT: 36.4 % (ref 36.0–46.0)
Hemoglobin: 12.3 g/dL (ref 12.0–15.0)
MCH: 27 pg (ref 26.0–34.0)
MCHC: 33.8 g/dL (ref 30.0–36.0)
MCV: 80 fL (ref 78.0–100.0)
Platelets: 288 10*3/uL (ref 150–400)
RBC: 4.55 MIL/uL (ref 3.87–5.11)
RDW: 15.3 % (ref 11.5–15.5)
WBC: 7.7 10*3/uL (ref 4.0–10.5)

## 2013-07-02 LAB — HEPATIC FUNCTION PANEL
ALT: 10 U/L (ref 0–35)
AST: 16 U/L (ref 0–37)
Albumin: 3.6 g/dL (ref 3.5–5.2)
Alkaline Phosphatase: 108 U/L (ref 39–117)
BILIRUBIN DIRECT: 0.1 mg/dL (ref 0.0–0.3)
Indirect Bilirubin: 0.2 mg/dL (ref 0.0–0.9)
Total Bilirubin: 0.3 mg/dL (ref 0.3–1.2)
Total Protein: 6.6 g/dL (ref 6.0–8.3)

## 2013-07-02 LAB — LIPID PANEL
CHOL/HDL RATIO: 7.4 ratio
Cholesterol: 252 mg/dL — ABNORMAL HIGH (ref 0–200)
HDL: 34 mg/dL — ABNORMAL LOW (ref >39)
LDL Cholesterol: 151 mg/dL — ABNORMAL HIGH (ref 0–99)
TRIGLYCERIDES: 337 mg/dL — AB (ref <150)
VLDL: 67 mg/dL — ABNORMAL HIGH (ref 0–40)

## 2013-07-02 LAB — TSH: TSH: 1.385 u[IU]/mL (ref 0.350–4.500)

## 2013-07-02 LAB — HEMOGLOBIN A1C
Hgb A1c MFr Bld: 5.9 % — ABNORMAL HIGH (ref <5.7)
Mean Plasma Glucose: 123 mg/dL — ABNORMAL HIGH (ref <117)

## 2013-07-09 ENCOUNTER — Ambulatory Visit: Payer: Managed Care, Other (non HMO) | Admitting: Physician Assistant

## 2013-07-09 ENCOUNTER — Ambulatory Visit: Payer: Managed Care, Other (non HMO) | Admitting: Family Medicine

## 2013-07-16 ENCOUNTER — Ambulatory Visit (INDEPENDENT_AMBULATORY_CARE_PROVIDER_SITE_OTHER): Payer: Managed Care, Other (non HMO) | Admitting: Family Medicine

## 2013-07-16 ENCOUNTER — Ambulatory Visit: Payer: Managed Care, Other (non HMO) | Admitting: Family Medicine

## 2013-07-16 ENCOUNTER — Encounter: Payer: Self-pay | Admitting: Family Medicine

## 2013-07-16 VITALS — BP 150/82 | HR 87 | Temp 97.8°F | Ht 71.5 in | Wt 263.1 lb

## 2013-07-16 DIAGNOSIS — A048 Other specified bacterial intestinal infections: Secondary | ICD-10-CM

## 2013-07-16 DIAGNOSIS — E785 Hyperlipidemia, unspecified: Secondary | ICD-10-CM

## 2013-07-16 DIAGNOSIS — E669 Obesity, unspecified: Secondary | ICD-10-CM

## 2013-07-16 DIAGNOSIS — R600 Localized edema: Secondary | ICD-10-CM

## 2013-07-16 DIAGNOSIS — R197 Diarrhea, unspecified: Secondary | ICD-10-CM

## 2013-07-16 DIAGNOSIS — R109 Unspecified abdominal pain: Secondary | ICD-10-CM

## 2013-07-16 DIAGNOSIS — T7840XA Allergy, unspecified, initial encounter: Secondary | ICD-10-CM

## 2013-07-16 DIAGNOSIS — R609 Edema, unspecified: Secondary | ICD-10-CM

## 2013-07-16 MED ORDER — MOMETASONE FUROATE 50 MCG/ACT NA SUSP: 2.0000 | Freq: Every day | NASAL | Status: DC

## 2013-07-16 NOTE — Progress Notes (Signed)
Pre visit review using our clinic review tool, if applicable. No additional management support is needed unless otherwise documented below in the visit note. 

## 2013-07-16 NOTE — Patient Instructions (Addendum)
Try Culturelle or Phillip's colon health  Jobst Stockings light weight knee highs, 10-20 mmHG on in am off in pm  Ginger caps or tea (stash)  Raynaud's Syndrome Raynaud's Syndrome is a disorder of the blood vessels in your hands and feet. It occurs when small arteries of the arms/hands or legs/feet become sensitive to cold or emotional upset. This causes the arteries to constrict, or narrow, and reduces blood flow to the area. The color in the fingers or toes changes from white to bluish to red and this is not usually painful. There may be numbness and tingling. Sores on the skin (ulcers) can form. Symptoms are usually relieved by warming. HOME CARE INSTRUCTIONS   Avoid exposure to cold. Keep your whole body warm and dry. Dress in layers. Wear mittens or gloves when handling ice or frozen food and when outdoors. Use holders for glasses or cans containing cold drinks. If possible, stay indoors during cold weather.  Limit your use of caffeine. Switch to decaffeinated coffee, tea, and soda pop. Avoid chocolate.  Avoid smoking or being around cigarette smoke. Smoke will make symptoms worse.  Wear loose fitting socks and comfortable, roomy shoes.  Avoid vibrating tools and machinery.  If possible, avoid stressful and emotional situations. Exercise, meditation and yoga may help you cope with stress. Biofeedback may be useful.  Ask your caregiver about medicine (calcium channel blockers) that may control Raynaud's phenomena. SEEK MEDICAL CARE IF:   Your discomfort becomes worse, despite conservative treatment.  You develop sores on your fingers and toes that do not heal. Document Released: 06/07/2000 Document Revised: 09/02/2011 Document Reviewed: 06/14/2008 [facility] Patient Information 2014 [place].

## 2013-07-19 ENCOUNTER — Other Ambulatory Visit: Payer: Self-pay | Admitting: Family Medicine

## 2013-07-19 ENCOUNTER — Telehealth: Payer: Self-pay | Admitting: *Deleted

## 2013-07-19 ENCOUNTER — Encounter: Payer: Self-pay | Admitting: Family Medicine

## 2013-07-19 LAB — H. PYLORI ANTIBODY, IGG: H Pylori IgG: 1.11 {ISR} — ABNORMAL HIGH

## 2013-07-19 MED ORDER — AMOXICILLIN 500 MG PO CAPS: 1000.0000 mg | ORAL_CAPSULE | Freq: Two times a day (BID) | ORAL | Status: DC

## 2013-07-19 MED ORDER — OMEPRAZOLE 20 MG PO CPDR: 20.0000 mg | DELAYED_RELEASE_CAPSULE | Freq: Two times a day (BID) | ORAL | Status: DC

## 2013-07-19 MED ORDER — CLARITHROMYCIN 500 MG PO TABS: 500.0000 mg | ORAL_TABLET | Freq: Two times a day (BID) | ORAL | Status: DC

## 2013-07-19 NOTE — Telephone Encounter (Signed)
Message copied by Kathi SimpersFERGERSON, Annastacia Duba A on Mon Jul 19, 2013  2:55 PM ------      Message from: Danise EdgeBLYTH, STACEY A      Created: Mon Jul 19, 2013 12:38 PM       She has H Pylori now. Needs Amoxicillin 500 mg tabs 2 bid, Biaxin 500 mg tab 1 bid and Omeprazole 20 mg bid x 10 days. Add probiotics ------

## 2013-07-19 NOTE — Assessment & Plan Note (Signed)
Started on Amoxicillin, Biaxin and ompeprazole. Probiotics daily, minimize fatty and spicy foods

## 2013-07-19 NOTE — Assessment & Plan Note (Signed)
Check stool cultures, add probiotics and fiber

## 2013-07-19 NOTE — Assessment & Plan Note (Signed)
Compression hose, elevation and minimize sodium 

## 2013-07-19 NOTE — Assessment & Plan Note (Signed)
Has not tolerated statins, avoid trans fats and increase exercise

## 2013-07-19 NOTE — Telephone Encounter (Signed)
Notified pt. Rx's sent to [facility]. Pt already taking probiotic.

## 2013-07-19 NOTE — Assessment & Plan Note (Signed)
Encouraged DASH diet, minimize simple carbs and saturated fats. Increase exercise

## 2013-07-19 NOTE — Progress Notes (Signed)
Patient ID: Mallory [name], female   DOB: 07/18/1965, 48 y.o.   MRN: [id number] Mallory [name] [id number] 05/04/1966 07/19/2013      Progress Note-Follow Up  Subjective  Chief Complaint  Chief Complaint  Patient presents with  . Follow-up    HPI  Patient is a 48 year old Caucasian female in today complaining of worsening nausea. She reports feeling since around Thanksgiving. Somewhat better and worse in p.m. She says she feels bloated frequently. Infrequent grandmother stool several times daily. No bloody stool. She cholecystectomy may have decreased pain initially. At present she is struggling with malaise denies fevers or chills. No chest pain or palpitations. Continues to struggle with nasal congestion although she notes nasal steroids have been marginally helpful.  Past Medical History  Diagnosis Date  . Anxiety   . Depression     no counseling  . GERD (gastroesophageal reflux disease)   . Allergy     year round  . Hyperlipidemia 2002  . IBS (irritable bowel syndrome) 2008    improved over last couple of years using Align  . Interstitial cystitis 2006    Dr Marcine MatarStephen Dahlstedt  . Pulmonary nodule 3/24/20101    Incidental finding of CT Chest ordered for chest pain  . Personal history of kidney stones 1986  . Asthma     mild, intermittent, allergy triggered  . Obesity   . Fibromyalgia 1997    dx by rheumatology, no recent flares  . Hiatal hernia   . NECK PAIN 12/15/2009  . GERD 02/10/2009  . DEPRESSION 02/10/2009  . Chronic laryngitis 04/26/2009  . ANXIETY 02/10/2009  . ALLERGIC RHINITIS 02/10/2009  . Pedal edema 02/11/2011  . Endometriosis 03/20/2011  . Headache(784.0) 04/30/2011  . Fasciculations of muscle 08/07/2011  . Elevated BP 08/07/2011    NO LONGER A PROBLEM AND WAS NEVER ON B/P MEDICATION  . Atypical chest pain 08/07/2011    FOLLOW UP NUCLEAR STRESS TEST 08/16/11- NORMAL  . Fatigue 03/13/2012  . Chronic cholecystitis with calculus 09/2012    cholecystectomy 10/2012  .  Arthritis     PAIN AT TIMES IN BOTH KNEES-? ARTHRITIS  . Varicose veins 05/09/2013  . H. pylori infection 07/19/2013    Past Surgical History  Procedure Laterality Date  . Dilation and curettage of uterus  2002  . Tonsillectomy    . Polypectomy      uterine  . Cholecystectomy N/A 10/28/2012    Procedure: LAPAROSCOPIC CHOLECYSTECTOMY WITH INTRAOPERATIVE CHOLANGIOGRAM;  Surgeon: Almond LintFaera Byerly, MD;  Location: [facility];  Service: General;  Laterality: N/A;    Family History  Problem Relation Age of Onset  . Diabetes Mother   . Hyperlipidemia Mother   . Hypertension Mother   . Breast cancer Mother   . Cancer Mother     breast/ ovarian  . Cancer Maternal Aunt     colon  . Arthritis Paternal Grandmother   . Heart disease Paternal Grandmother   . Stroke Father 1871  . Migraines Sister   . Cancer Maternal Grandmother   . Cancer Maternal Grandfather     History   Social History  . Marital Status: Married    Spouse Name: N/A    Number of Children: 2  . Years of Education: N/A   Occupational History  . unemployed    Social History Main Topics  . Smoking status: Former Smoker    Types: Cigarettes    Quit date: 06/24/1985  . Smokeless tobacco: Never Used     Comment: when  a teenager  . Alcohol Use: No  . Drug Use: No  . Sexual Activity: Yes    Partners: Male   Other Topics Concern  . Not on file   Social History Narrative   Married, has one grown son and one grown daughter.   Has 2 grandchildren.   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    Current Outpatient Prescriptions on File Prior to Visit  Medication Sig Dispense Refill  . acetaminophen (TYLENOL) 500 MG tablet Take 500 mg by mouth every 6 (six) hours as needed for pain.      Marland Kitchen albuterol (PROVENTIL HFA;VENTOLIN HFA) 108 (90 BASE) MCG/ACT inhaler Inhale 2 puffs into the lungs every 6 (six) hours as needed for wheezing.      Marland Kitchen ALPRAZolam (XANAX) 0.5 MG tablet Take 1 tablet (0.5 mg total) by  mouth 2 (two) times daily as needed for sleep or anxiety.  60 tablet  2  . Cholecalciferol (VITAMIN D) 2000 UNITS tablet Take 2,000 Units by mouth every evening.      . clidinium-chlordiazePOXIDE (LIBRAX) 2.5-5 MG per capsule Take 1 capsule by mouth as needed.      Marland Kitchen levocetirizine (XYZAL) 5 MG tablet TAKE 1 TABLET BY MOUTH EVERY DAY  90 tablet  1  . Norethindrone-Ethinyl Estradiol-Fe Biphas (LO LOESTRIN FE) 1 MG-10 MCG / 10 MCG tablet Take 1 tablet by mouth daily. TAKES AT BEDTIME      . pantoprazole (PROTONIX) 40 MG tablet TAKE 1 TABLET BY MOUTH TWICE DAILY  180 tablet  1  . Probiotic Product (ALIGN) 4 MG CAPS Take 4 mg by mouth daily.      . promethazine (PHENERGAN) 25 MG tablet Take 25 mg by mouth every 6 (six) hours as needed for nausea.      Marland Kitchen triamcinolone cream (KENALOG) 0.1 % Apply topically 2 (two) times daily as needed. urticaria  85.2 g  1   No current facility-administered medications on file prior to visit.    Allergies  Allergen Reactions  . Cymbalta [Duloxetine Hcl] Nausea Only    Nausea & Fatigue  . Simvastatin     Muscle ache in legs  . Venlafaxine     REACTION: bad dreams    Review of Systems  Review of Systems  Constitutional: Negative for fever and malaise/fatigue.  HENT: Negative for congestion.   Eyes: Negative for discharge.  Respiratory: Negative for shortness of breath.   Cardiovascular: Negative for chest pain, palpitations and leg swelling.  Gastrointestinal: Positive for heartburn, nausea, abdominal pain and diarrhea. Negative for constipation, blood in stool and melena.  Genitourinary: Negative for dysuria.  Musculoskeletal: Negative for falls.  Skin: Negative for rash.  Neurological: Negative for loss of consciousness and headaches.  Endo/Heme/Allergies: Negative for polydipsia.  Psychiatric/Behavioral: Negative for depression and suicidal ideas. The patient is not nervous/anxious and does not have insomnia.     Objective  BP 150/82  Pulse 87   Temp(Src) 97.8 F (36.6 C) (Oral)  Ht 5' 11.5" (1.816 m)  Wt 263 lb 1.3 oz (119.332 kg)  BMI 36.18 kg/m2  SpO2 98%  Physical Exam  Physical Exam  Constitutional: She is oriented to person, place, and time and well-developed, well-nourished, and in no distress. No distress.  HENT:  Head: Normocephalic and atraumatic.  Eyes: Conjunctivae are normal.  Neck: Neck supple. No thyromegaly present.  Cardiovascular: Normal rate, regular rhythm and normal heart sounds.   No murmur heard. Pulmonary/Chest: Effort normal and breath sounds  normal. She has no wheezes.  Abdominal: She exhibits no distension and no mass.  Musculoskeletal: She exhibits no edema.  Lymphadenopathy:    She has no cervical adenopathy.  Neurological: She is alert and oriented to person, place, and time.  Skin: Skin is warm and dry. No rash noted. She is not diaphoretic.  Psychiatric: Memory, affect and judgment normal.    Lab Results  Component Value Date   TSH 1.385 07/02/2013   Lab Results  Component Value Date   WBC 7.7 07/02/2013   HGB 12.3 07/02/2013   HCT 36.4 07/02/2013   MCV 80.0 07/02/2013   PLT 288 07/02/2013   Lab Results  Component Value Date   CREATININE 0.91 07/02/2013   BUN 15 07/02/2013   NA 140 07/02/2013   K 4.4 07/02/2013   CL 105 07/02/2013   CO2 24 07/02/2013   Lab Results  Component Value Date   ALT 10 07/02/2013   AST 16 07/02/2013   ALKPHOS 108 07/02/2013   BILITOT 0.3 07/02/2013   Lab Results  Component Value Date   CHOL 252* 07/02/2013   Lab Results  Component Value Date   HDL 34* 07/02/2013   Lab Results  Component Value Date   LDLCALC 151* 07/02/2013   Lab Results  Component Value Date   TRIG 337* 07/02/2013   Lab Results  Component Value Date   CHOLHDL 7.4 07/02/2013     Assessment & Plan  HYPERLIPIDEMIA Has not tolerated statins, avoid trans fats and increase exercise  H. pylori infection Started on Amoxicillin, Biaxin and ompeprazole. Probiotics daily, minimize fatty and spicy  foods  OBESITY Encouraged DASH diet, minimize simple carbs and saturated fats. Increase exercise  Pedal edema Compression hose, elevation and minimize sodium  Diarrhea Check stool cultures, add probiotics and fiber

## 2013-07-20 ENCOUNTER — Encounter: Payer: Self-pay | Admitting: Family Medicine

## 2013-07-20 LAB — CLOSTRIDIUM DIFFICILE EIA
CDIFTX: NEGATIVE
CDIFTX: NEGATIVE

## 2013-07-20 LAB — OVA AND PARASITE EXAMINATION: OP: NONE SEEN

## 2013-07-20 LAB — FECAL LACTOFERRIN, QUANT
LACTOFERRIN: NEGATIVE
LACTOFERRIN: NEGATIVE

## 2013-07-20 LAB — GIARDIA/CRYPTOSPORIDIUM (EIA)
Cryptosporidium Screen (EIA): NEGATIVE
Giardia Screen (EIA): NEGATIVE

## 2013-07-21 LAB — CLOSTRIDIUM DIFFICILE BY PCR: Toxigenic C. Difficile by PCR: NOT DETECTED

## 2013-07-23 LAB — STOOL CULTURE

## 2013-07-26 ENCOUNTER — Telehealth: Payer: Self-pay

## 2013-07-26 NOTE — Telephone Encounter (Signed)
Patient left a message stating she would like to know if her labs are back? And if she can have some diflucan? Pt stated she has been taking antibiotics and usually needs diflucan?  Please advise the diflucan? I took care of the other one

## 2013-07-26 NOTE — Telephone Encounter (Signed)
Yes all labs back and all neg. Can have Diflucan 150 mg tab 1 tab po q week x 2 weeks disp #2 with 1 rf

## 2013-07-27 MED ORDER — FLUCONAZOLE 150 MG PO TABS: 150.0000 mg | ORAL_TABLET | ORAL | Status: DC

## 2013-08-04 ENCOUNTER — Encounter: Payer: Self-pay | Admitting: Family Medicine

## 2013-08-04 DIAGNOSIS — R140 Abdominal distension (gaseous): Secondary | ICD-10-CM

## 2013-08-04 DIAGNOSIS — A048 Other specified bacterial intestinal infections: Secondary | ICD-10-CM

## 2013-08-06 MED ORDER — RANITIDINE HCL 300 MG PO TABS: 300.0000 mg | ORAL_TABLET | Freq: Every day | ORAL | Status: DC

## 2013-08-06 MED ORDER — PROMETHAZINE HCL 25 MG PO TABS: 25.0000 mg | ORAL_TABLET | Freq: Four times a day (QID) | ORAL | Status: DC | PRN

## 2013-08-06 NOTE — Telephone Encounter (Signed)
Lab order placed for H pylori. Will wait to see if patient calls for Ranitidine 300 mg

## 2013-08-06 NOTE — Telephone Encounter (Signed)
Patient called back and I informed her of what Dr. Abner GreenspanBlyth stated. She does want Ranitidine to be called into [facility] in [place]. Also, I informed her that a nurse will call her back because we need to ask the lab technician if she has the H. Pylori kits in stock or if we would need to order one.

## 2013-08-06 NOTE — Addendum Note (Signed)
Addended by: Mervin KungFERGERSON, Clarivel Callaway A on: 08/06/2013 04:09 PM   Modules accepted: Orders

## 2013-08-12 ENCOUNTER — Telehealth: Payer: Self-pay | Admitting: Family Medicine

## 2013-08-12 NOTE — Telephone Encounter (Signed)
Patient called back regarding this. Patient states that she is also having a burning sensation all throughout her stomach.

## 2013-08-12 NOTE — Telephone Encounter (Signed)
Was told not to take protonix for two weeks and to take rantidine in the evening.  Can she take tums during the day as her acid reflux is really a problem

## 2013-08-12 NOTE — Telephone Encounter (Signed)
Yes she can take Tums just no Protonix x 2 weeks so we can run an H Pylori breat test

## 2013-08-21 ENCOUNTER — Other Ambulatory Visit: Payer: Self-pay | Admitting: Family Medicine

## 2013-08-23 ENCOUNTER — Encounter: Payer: Self-pay | Admitting: Family Medicine

## 2013-08-23 ENCOUNTER — Ambulatory Visit (INDEPENDENT_AMBULATORY_CARE_PROVIDER_SITE_OTHER): Payer: Managed Care, Other (non HMO) | Admitting: Family Medicine

## 2013-08-23 VITALS — BP 124/80 | HR 77 | Temp 98.2°F | Ht 71.5 in | Wt 258.1 lb

## 2013-08-23 DIAGNOSIS — F341 Dysthymic disorder: Secondary | ICD-10-CM

## 2013-08-23 DIAGNOSIS — E669 Obesity, unspecified: Secondary | ICD-10-CM

## 2013-08-23 DIAGNOSIS — F418 Other specified anxiety disorders: Secondary | ICD-10-CM

## 2013-08-23 DIAGNOSIS — K649 Unspecified hemorrhoids: Secondary | ICD-10-CM

## 2013-08-23 DIAGNOSIS — K219 Gastro-esophageal reflux disease without esophagitis: Secondary | ICD-10-CM

## 2013-08-23 MED ORDER — RANITIDINE HCL 300 MG PO TABS: 300.0000 mg | ORAL_TABLET | Freq: Every day | ORAL | Status: DC

## 2013-08-23 MED ORDER — HYDROCORTISONE ACETATE 25 MG RE SUPP: 25.0000 mg | Freq: Every evening | RECTAL | Status: DC | PRN

## 2013-08-23 NOTE — Patient Instructions (Signed)
Hemorrhoids Hemorrhoids are swollen veins around the rectum or anus. There are two types of hemorrhoids:   Internal hemorrhoids. These occur in the veins just inside the rectum. They may poke through to the outside and become irritated and painful.  External hemorrhoids. These occur in the veins outside the anus and can be felt as a painful swelling or hard lump near the anus. CAUSES  Pregnancy.   Obesity.   Constipation or diarrhea.   Straining to have a bowel movement.   Sitting for long periods on the toilet.  Heavy lifting or other activity that caused you to strain.  Anal intercourse. SYMPTOMS   Pain.   Anal itching or irritation.   Rectal bleeding.   Fecal leakage.   Anal swelling.   One or more lumps around the anus.  DIAGNOSIS  Your caregiver may be able to diagnose hemorrhoids by visual examination. Other examinations or tests that may be performed include:   Examination of the rectal area with a gloved hand (digital rectal exam).   Examination of anal canal using a small tube (scope).   A blood test if you have lost a significant amount of blood.  A test to look inside the colon (sigmoidoscopy or colonoscopy). TREATMENT Most hemorrhoids can be treated at home. However, if symptoms do not seem to be getting better or if you have a lot of rectal bleeding, your caregiver may perform a procedure to help make the hemorrhoids get smaller or remove them completely. Possible treatments include:   Placing a rubber band at the base of the hemorrhoid to cut off the circulation (rubber band ligation).   Injecting a chemical to shrink the hemorrhoid (sclerotherapy).   Using a tool to burn the hemorrhoid (infrared light therapy).   Surgically removing the hemorrhoid (hemorrhoidectomy).   Stapling the hemorrhoid to block blood flow to the tissue (hemorrhoid stapling).  HOME CARE INSTRUCTIONS   Eat foods with fiber, such as whole grains, beans,  nuts, fruits, and vegetables. Ask your doctor about taking products with added fiber in them (fibersupplements).  Increase fluid intake. Drink enough water and fluids to keep your urine clear or pale yellow.   Exercise regularly.   Go to the bathroom when you have the urge to have a bowel movement. Do not wait.   Avoid straining to have bowel movements.   Keep the anal area dry and clean. Use wet toilet paper or moist towelettes after a bowel movement.   Medicated creams and suppositories may be used or applied as directed.   Only take over-the-counter or prescription medicines as directed by your caregiver.   Take warm sitz baths for 15 20 minutes, 3 4 times a day to ease pain and discomfort.   Place ice packs on the hemorrhoids if they are tender and swollen. Using ice packs between sitz baths may be helpful.   Put ice in a plastic bag.   Place a towel between your skin and the bag.   Leave the ice on for 15 20 minutes, 3 4 times a day.   Do not use a donut-shaped pillow or sit on the toilet for long periods. This increases blood pooling and pain.  SEEK MEDICAL CARE IF:  You have increasing pain and swelling that is not controlled by treatment or medicine.  You have uncontrolled bleeding.  You have difficulty or you are unable to have a bowel movement.  You have pain or inflammation outside the area of the hemorrhoids. MAKE SURE YOU:    Understand these instructions.  Will watch your condition.  Will get help right away if you are not doing well or get worse. Document Released: 06/07/2000 Document Revised: 05/27/2012 Document Reviewed: 04/14/2012 [employer] Patient Information 2014 [employer].  

## 2013-08-23 NOTE — Progress Notes (Signed)
Pre visit review using our clinic review tool, if applicable. No additional management support is needed unless otherwise documented below in the visit note. 

## 2013-08-23 NOTE — Progress Notes (Signed)
Patient ID: Mallory [name], female   DOB: February 02, 1966, 48 y.o.   MRN: [id number] Mallory [name] [id number] Nov 15, 1965 08/23/2013      Progress Note-Follow Up  Subjective  Chief Complaint  Chief Complaint  Patient presents with  . Follow-up    4 week    HPI  Patient is a 48 yo female in today for follow up. She reports she felt much after being treated for H Pylori but now she is having a mild increase in Dyspepsia the past few days. Also notes her hemorrhoids have flared recently. No f/c/abdominal pain at this time. No CP/palp/SOB/GI c/o. Her depression/anxiety is doing better on the Sertraline  Past Medical History  Diagnosis Date  . Anxiety   . Depression     no counseling  . GERD (gastroesophageal reflux disease)   . Allergy     year round  . Hyperlipidemia 2002  . IBS (irritable bowel syndrome) 2008    improved over last couple of years using Align  . Interstitial cystitis 2006    Dr Marcine Matar  . Pulmonary nodule 3/24/20101    Incidental finding of CT Chest ordered for chest pain  . Personal history of kidney stones 1986  . Asthma     mild, intermittent, allergy triggered  . Obesity   . Fibromyalgia 1997    dx by rheumatology, no recent flares  . Hiatal hernia   . NECK PAIN 12/15/2009  . GERD 02/10/2009  . DEPRESSION 02/10/2009  . Chronic laryngitis 04/26/2009  . ANXIETY 02/10/2009  . ALLERGIC RHINITIS 02/10/2009  . Pedal edema 02/11/2011  . Endometriosis 03/20/2011  . Headache(784.0) 04/30/2011  . Fasciculations of muscle 08/07/2011  . Elevated BP 08/07/2011    NO LONGER A PROBLEM AND WAS NEVER ON B/P MEDICATION  . Atypical chest pain 08/07/2011    FOLLOW UP NUCLEAR STRESS TEST 08/16/11- NORMAL  . Fatigue 03/13/2012  . Chronic cholecystitis with calculus 09/2012    cholecystectomy 10/2012  . Arthritis     PAIN AT TIMES IN BOTH KNEES-? ARTHRITIS  . Varicose veins 05/09/2013  . H. pylori infection 07/19/2013  . Diarrhea 07/19/2013    Past Surgical History   Procedure Laterality Date  . Dilation and curettage of uterus  2002  . Tonsillectomy    . Polypectomy      uterine  . Cholecystectomy N/A 10/28/2012    Procedure: LAPAROSCOPIC CHOLECYSTECTOMY WITH INTRAOPERATIVE CHOLANGIOGRAM;  Surgeon: Almond Lint, MD;  Location: [facility];  Service: General;  Laterality: N/A;    Family History  Problem Relation Age of Onset  . Diabetes Mother   . Hyperlipidemia Mother   . Hypertension Mother   . Breast cancer Mother   . Cancer Mother     breast/ ovarian  . Cancer Maternal Aunt     colon  . Arthritis Paternal Grandmother   . Heart disease Paternal Grandmother   . Stroke Father 62  . Migraines Sister   . Cancer Maternal Grandmother   . Cancer Maternal Grandfather     History   Social History  . Marital Status: Married    Spouse Name: N/A    Number of Children: 2  . Years of Education: N/A   Occupational History  . unemployed    Social History Main Topics  . Smoking status: Former Smoker    Types: Cigarettes    Quit date: 06/24/1985  . Smokeless tobacco: Never Used     Comment: when a teenager  . Alcohol Use: No  .  Drug Use: No  . Sexual Activity: Yes    Partners: Male   Other Topics Concern  . Not on file   Social History Narrative   Married, has one grown son and one grown daughter.   Has 2 grandchildren.   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    Current Outpatient Prescriptions on File Prior to Visit  Medication Sig Dispense Refill  . acetaminophen (TYLENOL) 500 MG tablet Take 500 mg by mouth every 6 (six) hours as needed for pain.      Marland Kitchen. albuterol (PROVENTIL HFA;VENTOLIN HFA) 108 (90 BASE) MCG/ACT inhaler Inhale 2 puffs into the lungs every 6 (six) hours as needed for wheezing.      Marland Kitchen. ALPRAZolam (XANAX) 0.5 MG tablet Take 1 tablet (0.5 mg total) by mouth 2 (two) times daily as needed for sleep or anxiety.  60 tablet  2  . Cholecalciferol (VITAMIN D) 2000 UNITS tablet Take 2,000 Units by mouth  every evening.      . clidinium-chlordiazePOXIDE (LIBRAX) 2.5-5 MG per capsule Take 1 capsule by mouth as needed.      . fluconazole (DIFLUCAN) 150 MG tablet Take 1 tablet (150 mg total) by mouth as directed. Take 1 tablet every week X 2 weeks  2 tablet  1  . fluticasone (FLONASE) 50 MCG/ACT nasal spray       . levocetirizine (XYZAL) 5 MG tablet TAKE 1 TABLET BY MOUTH DAILY  90 tablet  1  . Norethindrone-Ethinyl Estradiol-Fe Biphas (LO LOESTRIN FE) 1 MG-10 MCG / 10 MCG tablet Take 1 tablet by mouth daily. TAKES AT BEDTIME      . pantoprazole (PROTONIX) 40 MG tablet TAKE 1 TABLET BY MOUTH TWICE DAILY  180 tablet  1  . Probiotic Product (ALIGN) 4 MG CAPS Take 4 mg by mouth daily. [facility]      . promethazine (PHENERGAN) 25 MG tablet Take 1 tablet (25 mg total) by mouth every 6 (six) hours as needed for nausea.  30 tablet  0  . ranitidine (ZANTAC) 300 MG tablet Take 1 tablet (300 mg total) by mouth at bedtime.  14 tablet  0  . sertraline (ZOLOFT) 50 MG tablet Take 50 mg by mouth daily.      Marland Kitchen. triamcinolone cream (KENALOG) 0.1 % Apply topically 2 (two) times daily as needed. urticaria  85.2 g  1   No current facility-administered medications on file prior to visit.    Allergies  Allergen Reactions  . Cymbalta [Duloxetine Hcl] Nausea Only    Nausea & Fatigue  . Simvastatin     Muscle ache in legs  . Venlafaxine     REACTION: bad dreams    Review of Systems  Review of Systems  Constitutional: Negative for fever and malaise/fatigue.  HENT: Negative for congestion.   Eyes: Negative for discharge.  Respiratory: Negative for shortness of breath.   Cardiovascular: Negative for chest pain, palpitations and leg swelling.  Gastrointestinal: Positive for heartburn, nausea and abdominal pain. Negative for diarrhea, constipation, blood in stool and melena.  Genitourinary: Negative for dysuria.  Musculoskeletal: Negative for falls.  Skin: Negative for rash.  Neurological: Negative  for loss of consciousness and headaches.  Endo/Heme/Allergies: Negative for polydipsia.  Psychiatric/Behavioral: Negative for depression and suicidal ideas. The patient is not nervous/anxious and does not have insomnia.     Objective  BP 124/80  Pulse 77  Temp(Src) 98.2 F (36.8 C) (Oral)  Ht 5' 11.5" (1.816 m)  Wt 258 lb 1.3 oz (117.064 kg)  BMI 35.50 kg/m2  SpO2 98%  Physical Exam  Physical Exam  Constitutional: She is oriented to person, place, and time and well-developed, well-nourished, and in no distress. No distress.  HENT:  Head: Normocephalic and atraumatic.  Eyes: Conjunctivae are normal.  Neck: Neck supple. No thyromegaly present.  Cardiovascular: Normal rate, regular rhythm and normal heart sounds.   No murmur heard. Pulmonary/Chest: Effort normal and breath sounds normal. She has no wheezes.  Abdominal: She exhibits no distension and no mass.  Musculoskeletal: She exhibits no edema.  Lymphadenopathy:    She has no cervical adenopathy.  Neurological: She is alert and oriented to person, place, and time.  Skin: Skin is warm and dry. No rash noted. She is not diaphoretic.  Psychiatric: Memory, affect and judgment normal.    Lab Results  Component Value Date   TSH 1.385 07/02/2013   Lab Results  Component Value Date   WBC 7.7 07/02/2013   HGB 12.3 07/02/2013   HCT 36.4 07/02/2013   MCV 80.0 07/02/2013   PLT 288 07/02/2013   Lab Results  Component Value Date   CREATININE 0.91 07/02/2013   BUN 15 07/02/2013   NA 140 07/02/2013   K 4.4 07/02/2013   CL 105 07/02/2013   CO2 24 07/02/2013   Lab Results  Component Value Date   ALT 10 07/02/2013   AST 16 07/02/2013   ALKPHOS 108 07/02/2013   BILITOT 0.3 07/02/2013   Lab Results  Component Value Date   CHOL 252* 07/02/2013   Lab Results  Component Value Date   HDL 34* 07/02/2013   Lab Results  Component Value Date   LDLCALC 151* 07/02/2013   Lab Results  Component Value Date   TRIG 337* 07/02/2013   Lab Results  Component  Value Date   CHOLHDL 7.4 07/02/2013     Assessment & Plan  GERD Recheck of H pylori is neg, encouraged ongoing acid suppression and offending foods, continue probiotics  Depression with anxiety Doing somewhat better on Sertraline.  OBESITY Encouraged DASH, increase exercise  Hemorrhoid Encouraged increased fiber and fluids, given Anusol HC suppositories to use prn

## 2013-08-24 LAB — H. PYLORI BREATH TEST: H. PYLORI BREATH TEST: NOT DETECTED

## 2013-08-25 NOTE — Assessment & Plan Note (Signed)
Recheck of H pylori is neg, encouraged ongoing acid suppression and offending foods, continue probiotics

## 2013-08-25 NOTE — Assessment & Plan Note (Signed)
Doing somewhat better on Sertraline.

## 2013-08-25 NOTE — Assessment & Plan Note (Signed)
Encouraged DASH, increase exercise

## 2013-08-25 NOTE — Assessment & Plan Note (Signed)
Encouraged increased fiber and fluids, given Anusol HC suppositories to use prn

## 2013-08-27 ENCOUNTER — Telehealth: Payer: Self-pay

## 2013-08-27 NOTE — Telephone Encounter (Signed)
Pt left a message stating that she has burning in her throat since she left her appt on Monday. Pt states she thought it was since she was off of her acid medication from doing the breath test? But she stated she feels a little congested, has a little cough and has greenish-yellow phlegm?  Pt would like to know if she needs an antibiotic or hold off for a few more days?  Please advise?

## 2013-08-27 NOTE — Telephone Encounter (Signed)
I would have her wait a few more days. Take a probiotic, zinc such as Coldeeze and avoid spicy and acidic foods

## 2013-08-30 NOTE — Telephone Encounter (Signed)
Does she want a steroid shot and antibiotic shot?

## 2013-08-30 NOTE — Telephone Encounter (Signed)
I am confused is she saying she wants a shot of steroids and antibiotics? If not have her follow the advice I gave and give it some time. If she is worse and wants shots she will need to come int

## 2013-08-31 ENCOUNTER — Encounter: Payer: Self-pay | Admitting: Family

## 2013-08-31 ENCOUNTER — Ambulatory Visit (INDEPENDENT_AMBULATORY_CARE_PROVIDER_SITE_OTHER): Payer: Managed Care, Other (non HMO) | Admitting: Family

## 2013-08-31 VITALS — BP 122/82 | HR 84 | Temp 98.7°F | Resp 16 | Ht 71.5 in | Wt 259.1 lb

## 2013-08-31 DIAGNOSIS — J209 Acute bronchitis, unspecified: Secondary | ICD-10-CM

## 2013-08-31 MED ORDER — PREDNISONE 10 MG PO TABS: ORAL_TABLET | ORAL | Status: DC

## 2013-08-31 MED ORDER — AMOXICILLIN-POT CLAVULANATE 875-125 MG PO TABS: 1.0000 | ORAL_TABLET | Freq: Two times a day (BID) | ORAL | Status: DC

## 2013-08-31 MED ORDER — ALBUTEROL SULFATE HFA 108 (90 BASE) MCG/ACT IN AERS: 2.0000 | INHALATION_SPRAY | Freq: Four times a day (QID) | RESPIRATORY_TRACT | Status: DC | PRN

## 2013-08-31 MED ORDER — HYDROCOD POLST-CHLORPHEN POLST 10-8 MG/5ML PO LQCR: 5.0000 mL | Freq: Two times a day (BID) | ORAL | Status: DC | PRN

## 2013-08-31 NOTE — Progress Notes (Signed)
Subjective:    Patient ID: Mallory [name], female    DOB: 11/27/1965, 48 y.o.   MRN: [id number]  HPI  Mallory [name] is a 48 yr old female who presents today with chief complaint of cough. She reports that last Tuesday she developed "burning in my throat." Next day started bringing up yellow/green mucous.  Reports associated nasal congestion. Took sudafed yesterday with minimal improvement.  Last night she developed "crackling and wheezing in my chest." used albuterol inhaler last night which she states didn't help.  She denies associated fever or sick contacts.  Review of Systems See HPI  Past Medical History  Diagnosis Date  . Anxiety   . Depression     no counseling  . GERD (gastroesophageal reflux disease)   . Allergy     year round  . Hyperlipidemia 2002  . IBS (irritable bowel syndrome) 2008    improved over last couple of years using Align  . Interstitial cystitis 2006    Dr Marcine MatarStephen Dahlstedt  . Pulmonary nodule 3/24/20101    Incidental finding of CT Chest ordered for chest pain  . Personal history of kidney stones 1986  . Asthma     mild, intermittent, allergy triggered  . Obesity   . Fibromyalgia 1997    dx by rheumatology, no recent flares  . Hiatal hernia   . NECK PAIN 12/15/2009  . GERD 02/10/2009  . DEPRESSION 02/10/2009  . Chronic laryngitis 04/26/2009  . ANXIETY 02/10/2009  . ALLERGIC RHINITIS 02/10/2009  . Pedal edema 02/11/2011  . Endometriosis 03/20/2011  . Headache(784.0) 04/30/2011  . Fasciculations of muscle 08/07/2011  . Elevated BP 08/07/2011    NO LONGER A PROBLEM AND WAS NEVER ON B/P MEDICATION  . Atypical chest pain 08/07/2011    FOLLOW UP NUCLEAR STRESS TEST 08/16/11- NORMAL  . Fatigue 03/13/2012  . Chronic cholecystitis with calculus 09/2012    cholecystectomy 10/2012  . Arthritis     PAIN AT TIMES IN BOTH KNEES-? ARTHRITIS  . Varicose veins 05/09/2013  . H. pylori infection 07/19/2013  . Diarrhea 07/19/2013    History   Social History  . Marital  Status: Married    Spouse Name: N/A    Number of Children: 2  . Years of Education: N/A   Occupational History  . unemployed    Social History Main Topics  . Smoking status: Former Smoker    Types: Cigarettes    Quit date: 06/24/1985  . Smokeless tobacco: Never Used     Comment: when a teenager  . Alcohol Use: No  . Drug Use: No  . Sexual Activity: Yes    Partners: Male   Other Topics Concern  . Not on file   Social History Narrative   Married, has one grown son and one grown daughter.   Has 2 grandchildren.   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    Past Surgical History  Procedure Laterality Date  . Dilation and curettage of uterus  2002  . Tonsillectomy    . Polypectomy      uterine  . Cholecystectomy N/A 10/28/2012    Procedure: LAPAROSCOPIC CHOLECYSTECTOMY WITH INTRAOPERATIVE CHOLANGIOGRAM;  Surgeon: Almond LintFaera Byerly, MD;  Location: [facility];  Service: General;  Laterality: N/A;    Family History  Problem Relation Age of Onset  . Diabetes Mother   . Hyperlipidemia Mother   . Hypertension Mother   . Breast cancer Mother   . Cancer Mother  breast/ ovarian  . Cancer Maternal Aunt     colon  . Arthritis Paternal Grandmother   . Heart disease Paternal Grandmother   . Stroke Father 7  . Migraines Sister   . Cancer Maternal Grandmother   . Cancer Maternal Grandfather     Allergies  Allergen Reactions  . Cymbalta [Duloxetine Hcl] Nausea Only    Nausea & Fatigue  . Simvastatin     Muscle ache in legs  . Venlafaxine     REACTION: bad dreams    Current Outpatient Prescriptions on File Prior to Visit  Medication Sig Dispense Refill  . acetaminophen (TYLENOL) 500 MG tablet Take 500 mg by mouth every 6 (six) hours as needed for pain.      Marland Kitchen albuterol (PROVENTIL HFA;VENTOLIN HFA) 108 (90 BASE) MCG/ACT inhaler Inhale 2 puffs into the lungs every 6 (six) hours as needed for wheezing.      Marland Kitchen ALPRAZolam (XANAX) 0.5 MG tablet Take 1 tablet  (0.5 mg total) by mouth 2 (two) times daily as needed for sleep or anxiety.  60 tablet  2  . Cholecalciferol (VITAMIN D) 2000 UNITS tablet Take 2,000 Units by mouth every evening.      . clidinium-chlordiazePOXIDE (LIBRAX) 2.5-5 MG per capsule Take 1 capsule by mouth as needed.      . fluconazole (DIFLUCAN) 150 MG tablet Take 1 tablet (150 mg total) by mouth as directed. Take 1 tablet every week X 2 weeks  2 tablet  1  . fluticasone (FLONASE) 50 MCG/ACT nasal spray       . hydrocortisone (ANUSOL-HC) 25 MG suppository Place 1 suppository (25 mg total) rectally at bedtime as needed for hemorrhoids or itching.  20 suppository  1  . levocetirizine (XYZAL) 5 MG tablet TAKE 1 TABLET BY MOUTH DAILY  90 tablet  1  . Norethindrone-Ethinyl Estradiol-Fe Biphas (LO LOESTRIN FE) 1 MG-10 MCG / 10 MCG tablet Take 1 tablet by mouth daily. TAKES AT BEDTIME      . pantoprazole (PROTONIX) 40 MG tablet TAKE 1 TABLET BY MOUTH TWICE DAILY  180 tablet  1  . Probiotic Product (ALIGN) 4 MG CAPS Take 4 mg by mouth daily. [facility]      . promethazine (PHENERGAN) 25 MG tablet Take 1 tablet (25 mg total) by mouth every 6 (six) hours as needed for nausea.  30 tablet  0  . ranitidine (ZANTAC) 300 MG tablet Take 1 tablet (300 mg total) by mouth at bedtime.  30 tablet  3  . sertraline (ZOLOFT) 50 MG tablet Take 50 mg by mouth daily.      Marland Kitchen triamcinolone cream (KENALOG) 0.1 % Apply topically 2 (two) times daily as needed. urticaria  85.2 g  1   No current facility-administered medications on file prior to visit.    BP 122/82  Pulse 84  Temp(Src) 98.7 F (37.1 C) (Oral)  Resp 16  Ht 5' 11.5" (1.816 m)  Wt 259 lb 1.9 oz (117.536 kg)  BMI 35.64 kg/m2  SpO2 100%       Objective:   Physical Exam  Constitutional: She is oriented to person, place, and time. She appears well-developed and well-nourished. No distress.  HENT:  Head: Normocephalic and atraumatic.  Right Ear: Tympanic membrane and ear canal  normal.  Left Ear: Tympanic membrane and ear canal normal.  Mouth/Throat: No oropharyngeal exudate, posterior oropharyngeal edema or posterior oropharyngeal erythema.  Cardiovascular: Normal rate and regular rhythm.   No murmur heard. Pulmonary/Chest: Effort  normal and breath sounds normal. No respiratory distress. She has no wheezes. She has no rales. She exhibits no tenderness.  Musculoskeletal: She exhibits no edema.  Neurological: She is alert and oriented to person, place, and time.  Psychiatric: She has a normal mood and affect. Her behavior is normal. Judgment and thought content normal.          Assessment & Plan:

## 2013-08-31 NOTE — Telephone Encounter (Signed)
[facility]

## 2013-08-31 NOTE — Patient Instructions (Signed)

## 2013-08-31 NOTE — Assessment & Plan Note (Addendum)
Historically, she has required steroids in the past due to severe cough. Will rx with augmentin (reports + diarrhea with zithromax).  Tussionex PRN (advised pt not to drive after taking).  Refill albuterol, add prednisone taper. Pt instructed to contact us if symptoms worsen or if not improved in 2-3 days.

## 2013-08-31 NOTE — Progress Notes (Signed)
Pre visit review using our clinic review tool, if applicable. No additional management support is needed unless otherwise documented below in the visit note. 

## 2013-09-03 ENCOUNTER — Telehealth: Payer: Self-pay | Admitting: Family Medicine

## 2013-09-03 NOTE — Telephone Encounter (Signed)
Please advise? Pt saw Melissa this week

## 2013-09-03 NOTE — Telephone Encounter (Signed)
It is too soon to know if what she got will help, continue meds through weekend. Keep on probiotics, start zinc 50 mg daily, increase rest and fluids, run a humidifier and seek care if worsens. Call next week for eval if worsens

## 2013-09-03 NOTE — Telephone Encounter (Signed)
Patient called in stating that she still has chest congestion, she has to "cough" to breath and has a stuffy nose. She says that she is using her inhaler but that the inhaler only works for about an hour. She says that she has been taking an OTC medicine(quinificine (sp?)) 400mg .

## 2013-09-07 ENCOUNTER — Encounter: Payer: Self-pay | Admitting: Family Medicine

## 2013-09-07 ENCOUNTER — Ambulatory Visit (HOSPITAL_BASED_OUTPATIENT_CLINIC_OR_DEPARTMENT_OTHER)
Admission: RE | Admit: 2013-09-07 | Discharge: 2013-09-07 | Disposition: A | Discharge status: Home / Self Care | Payer: Managed Care, Other (non HMO) | Source: Ambulatory Visit | Attending: Family Medicine | Admitting: Family Medicine

## 2013-09-07 ENCOUNTER — Ambulatory Visit (INDEPENDENT_AMBULATORY_CARE_PROVIDER_SITE_OTHER): Payer: Managed Care, Other (non HMO) | Admitting: Family Medicine

## 2013-09-07 ENCOUNTER — Telehealth: Payer: Self-pay | Admitting: Family Medicine

## 2013-09-07 VITALS — BP 136/88 | HR 80 | Temp 98.3°F | Wt 259.0 lb

## 2013-09-07 DIAGNOSIS — J309 Allergic rhinitis, unspecified: Secondary | ICD-10-CM

## 2013-09-07 DIAGNOSIS — J45901 Unspecified asthma with (acute) exacerbation: Secondary | ICD-10-CM

## 2013-09-07 DIAGNOSIS — R059 Cough, unspecified: Secondary | ICD-10-CM

## 2013-09-07 DIAGNOSIS — J45909 Unspecified asthma, uncomplicated: Secondary | ICD-10-CM | POA: Insufficient documentation

## 2013-09-07 DIAGNOSIS — R05 Cough: Secondary | ICD-10-CM

## 2013-09-07 DIAGNOSIS — K219 Gastro-esophageal reflux disease without esophagitis: Secondary | ICD-10-CM

## 2013-09-07 MED ORDER — PREDNISONE 10 MG PO TABS: ORAL_TABLET | ORAL | Status: DC

## 2013-09-07 MED ORDER — FLUTICASONE-SALMETEROL 250-50 MCG/DOSE IN AEPB: 1.0000 | INHALATION_SPRAY | Freq: Two times a day (BID) | RESPIRATORY_TRACT | Status: DC

## 2013-09-07 NOTE — Telephone Encounter (Signed)
Patient states that she has finished the prednisone and has 3 days worth of amoxicillin left but is still not feeling well. She states that she has a "crackling" sound when she takes a deep breath and it is hard to take a deep breath.

## 2013-09-07 NOTE — Telephone Encounter (Signed)
Pt is coming in at 4:15 today

## 2013-09-07 NOTE — Patient Instructions (Signed)
Bronchospasm, Adult °A bronchospasm is when the tubes that carry air in and out of your lungs (airwarys) spasm or tighten. During a bronchospasm it is hard to breathe. This is because the airways get smaller. A bronchospasm can be triggered by: °· Allergies. These may be to animals, pollen, food, or mold. °· Infection. This is a common cause of bronchospasm. °· Exercise. °· Irritants. These include pollution, cigarette smoke, strong odors, aerosol sprays, and paint fumes. °· Weather changes. °· Stress. °· Being emotional. °HOME CARE  °· Always have a plan for getting help. Know when to call your doctor and local emergency services (911 in the [place] Know where you can get emergency care. °· Only take medicines as told by your doctor. °· If you were prescribed an inhaler or nebulizer machine, ask your doctor how to use it correctly. Always use a spacer with your inhaler if you were given one. °· Stay calm during an attack. Try to relax and breathe more slowly. °· Control your home environment: °· Change your heating and air conditioning filter at least once a month. °· Limit your use of fireplaces and wood stoves. °· Do not  smoke. Do not  allow smoking in your home. °· Avoid perfumes and fragrances. °· Get rid of pests (such as roaches and mice) and their droppings. °· Throw away plants if you see mold on them. °· Keep your house clean and dust free. °· Replace carpet with wood, tile, or vinyl flooring. Carpet can trap dander and dust. °· Use allergy-proof pillows, mattress covers, and box spring covers. °· Wash bed sheets and blankets every week in hot water. Dry them in a dryer. °· Use blankets that are made of polyester or cotton. °· Wash hands frequently. °GET HELP IF: °· You have muscle aches. °· You have chest pain. °· The thick spit you spit or cough up (sputum) changes from clear or white to yellow, green, gray, or bloody. °· The thick spit you spit or cough up gets thicker. °· There are problems that may be  related to the medicine you are given such as: °· A rash. °· Itching. °· Swelling. °· Trouble breathing. °GET HELP RIGHT AWAY IF: °· You feel you cannot breathe or catch your breath. °· You cannot stop coughing. °· Your treatment is not helping you breathe better. °MAKE SURE YOU:  °· Understand these instructions. °· Will watch your condition. °· Will get help right away if you are not doing well or get worse. °Document Released: 04/07/2009 Document Revised: 02/10/2013 Document Reviewed: 12/01/2012 °[employer]® Patient Information ©2014 [employer]. ° °

## 2013-09-07 NOTE — Progress Notes (Signed)
Pre visit review using our clinic review tool, if applicable. No additional management support is needed unless otherwise documented below in the visit note. 

## 2013-09-09 ENCOUNTER — Telehealth: Payer: Self-pay | Admitting: Family Medicine

## 2013-09-09 NOTE — Telephone Encounter (Signed)
Patient states that the new medication that she has just started on advair/singulair has been giving her an "upset stomach". She would like to know what she should do for this.

## 2013-09-09 NOTE — Telephone Encounter (Signed)
Patient called back stating that she has been dehydrated since taking this medication

## 2013-09-10 NOTE — Telephone Encounter (Signed)
Please advise both questions?

## 2013-09-10 NOTE — Telephone Encounter (Signed)
Pt aware.

## 2013-09-10 NOTE — Telephone Encounter (Signed)
She needs to hydrate better and start ginger capsules 3 x a day while on these meds see if that helps

## 2013-09-12 ENCOUNTER — Encounter: Payer: Self-pay | Admitting: Family Medicine

## 2013-09-12 NOTE — Assessment & Plan Note (Signed)
Allowed a second course of Prednisone and started back on Advair. Continue Albuterol prn and if no improvement may need referral back to Pulmonology.

## 2013-09-12 NOTE — Progress Notes (Signed)
Patient ID: Mallory [name], female   DOB: 1965/11/19, 48 y.o.   MRN: [id number] [name] [id number] [birth year] 09/12/2013      Progress Note-Follow Up  Subjective  Chief Complaint  Chief Complaint  Patient presents with  . Cough    pt still have "crackling" sound in the chest, difficulty of breathing.She is on her last Prednisone today.    HPI  Patient is a 48 year old female today for routine medical care. She is in today for persistent congestion. She notes a recent course of antibiotics and steroids helped while she was on both but now that this generator her symptoms are worsening again. She has cough which is frequently productive. She has worsening nasal congestion and some wheezing most notably at night. She reports malaise and myalgias are present but denies fevers and chills. No GI or GU complaints. Continues to struggle with anxiety and depression is noted worsened slightly  Past Medical History  Diagnosis Date  . Anxiety   . Depression     no counseling  . GERD (gastroesophageal reflux disease)   . Allergy     year round  . Hyperlipidemia 2002  . IBS (irritable bowel syndrome) 2008    improved over last couple of years using Align  . Interstitial cystitis 2006    Dr Marcine Matar  . Pulmonary nodule 3/24/20101    Incidental finding of CT Chest ordered for chest pain  . Personal history of kidney stones 1986  . Asthma     mild, intermittent, allergy triggered  . Obesity   . Fibromyalgia 1997    dx by rheumatology, no recent flares  . Hiatal hernia   . NECK PAIN 12/15/2009  . GERD 02/10/2009  . DEPRESSION 02/10/2009  . Chronic laryngitis 04/26/2009  . ANXIETY 02/10/2009  . ALLERGIC RHINITIS 02/10/2009  . Pedal edema 02/11/2011  . Endometriosis 03/20/2011  . Headache(784.0) 04/30/2011  . Fasciculations of muscle 08/07/2011  . Elevated BP 08/07/2011    NO LONGER A PROBLEM AND WAS NEVER ON B/P MEDICATION  . Atypical chest pain 08/07/2011    FOLLOW UP NUCLEAR  STRESS TEST 08/16/11- NORMAL  . Fatigue 03/13/2012  . Chronic cholecystitis with calculus 09/2012    cholecystectomy 10/2012  . Arthritis     PAIN AT TIMES IN BOTH KNEES-? ARTHRITIS  . Varicose veins 05/09/2013  . H. pylori infection 07/19/2013  . Diarrhea 07/19/2013    Past Surgical History  Procedure Laterality Date  . Dilation and curettage of uterus  2002  . Tonsillectomy    . Polypectomy      uterine  . Cholecystectomy N/A 10/28/2012    Procedure: LAPAROSCOPIC CHOLECYSTECTOMY WITH INTRAOPERATIVE CHOLANGIOGRAM;  Surgeon: Almond Lint, MD;  Location: [facility];  Service: General;  Laterality: N/A;    Family History  Problem Relation Age of Onset  . Diabetes Mother   . Hyperlipidemia Mother   . Hypertension Mother   . Breast cancer Mother   . Cancer Mother     breast/ ovarian  . Cancer Maternal Aunt     colon  . Arthritis Paternal Grandmother   . Heart disease Paternal Grandmother   . Stroke Father 5  . Migraines Sister   . Cancer Maternal Grandmother   . Cancer Maternal Grandfather     History   Social History  . Marital Status: Married    Spouse Name: N/A    Number of Children: 2  . Years of Education: N/A   Occupational History  .  unemployed    Social History Main Topics  . Smoking status: Former Smoker    Types: Cigarettes    Quit date: 06/24/1985  . Smokeless tobacco: Never Used     Comment: when a teenager  . Alcohol Use: No  . Drug Use: No  . Sexual Activity: Yes    Partners: Male   Other Topics Concern  . Not on file   Social History Narrative   Married, has one grown son and one grown daughter.   Has 2 grandchildren.   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    Current Outpatient Prescriptions on File Prior to Visit  Medication Sig Dispense Refill  . acetaminophen (TYLENOL) 500 MG tablet Take 500 mg by mouth every 6 (six) hours as needed for pain.      Marland Kitchen. albuterol (PROVENTIL HFA;VENTOLIN HFA) 108 (90 BASE) MCG/ACT  inhaler Inhale 2 puffs into the lungs every 6 (six) hours as needed for wheezing.  1 Inhaler  2  . ALPRAZolam (XANAX) 0.5 MG tablet Take 1 tablet (0.5 mg total) by mouth 2 (two) times daily as needed for sleep or anxiety.  60 tablet  2  . amoxicillin-clavulanate (AUGMENTIN) 875-125 MG per tablet Take 1 tablet by mouth 2 (two) times daily.  20 tablet  0  . chlorpheniramine-HYDROcodone (TUSSIONEX PENNKINETIC ER) 10-8 MG/5ML LQCR Take 5 mLs by mouth every 12 (twelve) hours as needed for cough.  115 mL  0  . Cholecalciferol (VITAMIN D) 2000 UNITS tablet Take 2,000 Units by mouth every evening.      . clidinium-chlordiazePOXIDE (LIBRAX) 2.5-5 MG per capsule Take 1 capsule by mouth as needed.      . fluconazole (DIFLUCAN) 150 MG tablet Take 1 tablet (150 mg total) by mouth as directed. Take 1 tablet every week X 2 weeks  2 tablet  1  . fluticasone (FLONASE) 50 MCG/ACT nasal spray       . hydrocortisone (ANUSOL-HC) 25 MG suppository Place 1 suppository (25 mg total) rectally at bedtime as needed for hemorrhoids or itching.  20 suppository  1  . levocetirizine (XYZAL) 5 MG tablet TAKE 1 TABLET BY MOUTH DAILY  90 tablet  1  . Norethindrone-Ethinyl Estradiol-Fe Biphas (LO LOESTRIN FE) 1 MG-10 MCG / 10 MCG tablet Take 1 tablet by mouth daily. TAKES AT BEDTIME      . pantoprazole (PROTONIX) 40 MG tablet TAKE 1 TABLET BY MOUTH TWICE DAILY  180 tablet  1  . Probiotic Product (ALIGN) 4 MG CAPS Take 4 mg by mouth daily. [facility]      . promethazine (PHENERGAN) 25 MG tablet Take 1 tablet (25 mg total) by mouth every 6 (six) hours as needed for nausea.  30 tablet  0  . ranitidine (ZANTAC) 300 MG tablet Take 1 tablet (300 mg total) by mouth at bedtime.  30 tablet  3  . sertraline (ZOLOFT) 50 MG tablet Take 50 mg by mouth daily.      Marland Kitchen. triamcinolone cream (KENALOG) 0.1 % Apply topically 2 (two) times daily as needed. urticaria  85.2 g  1   No current facility-administered medications on file prior to  visit.    Allergies  Allergen Reactions  . Cymbalta [Duloxetine Hcl] Nausea Only    Nausea & Fatigue  . Simvastatin     Muscle ache in legs  . Venlafaxine     REACTION: bad dreams    Review of Systems  Review of Systems  Constitutional: Negative  for fever and malaise/fatigue.  HENT: Positive for congestion, sore throat and tinnitus.   Eyes: Negative for discharge.  Respiratory: Positive for cough, sputum production, shortness of breath and wheezing.   Cardiovascular: Negative for chest pain, palpitations and leg swelling.  Gastrointestinal: Negative for nausea, abdominal pain and diarrhea.  Genitourinary: Negative for dysuria.  Musculoskeletal: Negative for falls.  Skin: Negative for rash.  Neurological: Negative for loss of consciousness and headaches.  Endo/Heme/Allergies: Negative for polydipsia.  Psychiatric/Behavioral: Negative for depression and suicidal ideas. The patient is nervous/anxious. The patient does not have insomnia.     Objective  BP 136/88  Pulse 80  Temp(Src) 98.3 F (36.8 C) (Oral)  Wt 259 lb 0.6 oz (117.5 kg)  SpO2 98%  Physical Exam  Physical Exam  Constitutional: She is oriented to person, place, and time and well-developed, well-nourished, and in no distress. No distress.  HENT:  Head: Normocephalic and atraumatic.  Eyes: Conjunctivae are normal.  Neck: Neck supple. No thyromegaly present.  Cardiovascular: Normal rate, regular rhythm and normal heart sounds.   No murmur heard. Pulmonary/Chest: Effort normal and breath sounds normal. She has no wheezes.  Decreased bs b/l bases  Abdominal: She exhibits no distension and no mass.  Musculoskeletal: She exhibits no edema.  Lymphadenopathy:    She has no cervical adenopathy.  Neurological: She is alert and oriented to person, place, and time.  Skin: Skin is warm and dry. No rash noted. She is not diaphoretic.  Psychiatric: Memory, affect and judgment normal.    Lab Results  Component  Value Date   TSH 1.385 07/02/2013   Lab Results  Component Value Date   WBC 7.7 07/02/2013   HGB 12.3 07/02/2013   HCT 36.4 07/02/2013   MCV 80.0 07/02/2013   PLT 288 07/02/2013   Lab Results  Component Value Date   CREATININE 0.91 07/02/2013   BUN 15 07/02/2013   NA 140 07/02/2013   K 4.4 07/02/2013   CL 105 07/02/2013   CO2 24 07/02/2013   Lab Results  Component Value Date   ALT 10 07/02/2013   AST 16 07/02/2013   ALKPHOS 108 07/02/2013   BILITOT 0.3 07/02/2013   Lab Results  Component Value Date   CHOL 252* 07/02/2013   Lab Results  Component Value Date   HDL 34* 07/02/2013   Lab Results  Component Value Date   LDLCALC 151* 07/02/2013   Lab Results  Component Value Date   TRIG 337* 07/02/2013   Lab Results  Component Value Date   CHOLHDL 7.4 07/02/2013     Assessment & Plan  Asthma with acute exacerbation Allowed a second course of Prednisone and started back on Advair. Continue Albuterol prn and if no improvement may need referral back to Pulmonology.  ALLERGIC RHINITIS Continue current meds daily  GERD Improving after treatment of H Pylori.

## 2013-09-12 NOTE — Assessment & Plan Note (Signed)
Improving after treatment of H Pylori.

## 2013-09-12 NOTE — Assessment & Plan Note (Signed)
Continue current meds daily

## 2013-09-14 ENCOUNTER — Other Ambulatory Visit: Payer: Self-pay

## 2013-09-14 DIAGNOSIS — K219 Gastro-esophageal reflux disease without esophagitis: Secondary | ICD-10-CM

## 2013-09-14 MED ORDER — RANITIDINE HCL 300 MG PO TABS: 300.0000 mg | ORAL_TABLET | Freq: Every day | ORAL | Status: DC

## 2013-09-15 ENCOUNTER — Other Ambulatory Visit: Payer: Self-pay | Admitting: Family Medicine

## 2013-09-16 ENCOUNTER — Telehealth: Payer: Self-pay | Admitting: Family Medicine

## 2013-09-16 ENCOUNTER — Ambulatory Visit: Payer: Managed Care, Other (non HMO) | Admitting: Obstetrics and Gynecology

## 2013-09-16 ENCOUNTER — Telehealth: Payer: Self-pay

## 2013-09-16 NOTE — Telephone Encounter (Signed)
Wrong question answered

## 2013-09-16 NOTE — Telephone Encounter (Signed)
Please advise?  She is still having stomach pains off and on through out the day. Does Dr B want her to go to a GI doc. She has some Carafate left and wondered if Dr B wants her to try that

## 2013-09-16 NOTE — Telephone Encounter (Signed)
She is still having stomach pains off and on through out the day.  Does Dr B want her to go to a GI doc.  She has some Carafate left and wondered if Dr B wants her to try that

## 2013-09-16 NOTE — Telephone Encounter (Signed)
Have her do the Guaifenasin 400 mg tabs 2 tabs bid and send her in an Advair 500/50 inhaler, 1 puff po bid and come in if worsens still

## 2013-09-16 NOTE — Telephone Encounter (Signed)
Have her try the carafate and yes since she has had the recent diarrhea and persistent abdominal pain she should go back to GI, would she like us to set up and whom does she want to use?

## 2013-09-16 NOTE — Telephone Encounter (Signed)
Please look at other note.

## 2013-09-18 ENCOUNTER — Other Ambulatory Visit: Payer: Self-pay | Admitting: Family Medicine

## 2013-09-18 DIAGNOSIS — R109 Unspecified abdominal pain: Secondary | ICD-10-CM

## 2013-09-18 DIAGNOSIS — K219 Gastro-esophageal reflux disease without esophagitis: Secondary | ICD-10-CM

## 2013-09-18 DIAGNOSIS — R197 Diarrhea, unspecified: Secondary | ICD-10-CM

## 2013-10-07 ENCOUNTER — Ambulatory Visit: Payer: Managed Care, Other (non HMO) | Admitting: Family Medicine

## 2013-10-13 ENCOUNTER — Telehealth: Payer: Self-pay | Admitting: Family Medicine

## 2013-10-13 MED ORDER — MONTELUKAST SODIUM 10 MG PO TABS: 10.0000 mg | ORAL_TABLET | Freq: Every day | ORAL | Status: DC

## 2013-10-13 NOTE — Telephone Encounter (Signed)
Refill-montelukast 90 day supply

## 2013-10-27 ENCOUNTER — Other Ambulatory Visit: Payer: Self-pay | Admitting: Family Medicine

## 2013-10-29 NOTE — Telephone Encounter (Signed)
RX printed for md to sign and fax  Last rx done on 03-17-13 quantity 60 with 2

## 2013-11-04 ENCOUNTER — Encounter: Payer: Self-pay | Admitting: Internal Medicine

## 2013-11-10 ENCOUNTER — Encounter: Payer: Self-pay | Admitting: *Deleted

## 2013-12-10 ENCOUNTER — Encounter: Payer: Self-pay | Admitting: Family Medicine

## 2013-12-14 ENCOUNTER — Ambulatory Visit: Payer: Managed Care, Other (non HMO) | Admitting: Internal Medicine

## 2013-12-22 ENCOUNTER — Other Ambulatory Visit: Payer: Self-pay | Admitting: Family Medicine

## 2013-12-22 DIAGNOSIS — B001 Herpesviral vesicular dermatitis: Secondary | ICD-10-CM

## 2013-12-22 NOTE — Telephone Encounter (Signed)
Patient is calling regarding this.

## 2013-12-22 NOTE — Telephone Encounter (Signed)
Left detailed message informing patient of this. °

## 2013-12-22 NOTE — Telephone Encounter (Signed)
Spoke with pt re: acyclovir refill. Pt states she is getting another cold sore and would like a refill. Please advise.

## 2013-12-22 NOTE — Telephone Encounter (Signed)
Refill sent to pharmacy.  Take as directed.

## 2013-12-30 ENCOUNTER — Other Ambulatory Visit: Payer: Self-pay

## 2013-12-30 MED ORDER — SERTRALINE HCL 50 MG PO TABS
50.0000 mg | ORAL_TABLET | Freq: Every day | ORAL | Status: DC
Start: 2013-12-30 — End: 2014-02-01

## 2013-12-31 ENCOUNTER — Ambulatory Visit: Payer: Managed Care, Other (non HMO) | Admitting: Internal Medicine

## 2014-02-01 ENCOUNTER — Other Ambulatory Visit: Payer: Self-pay

## 2014-02-01 MED ORDER — SERTRALINE HCL 25 MG PO TABS
12.5000 mg | ORAL_TABLET | Freq: Every day | ORAL | Status: DC
Start: 2014-02-01 — End: 2014-05-06

## 2014-02-01 NOTE — Telephone Encounter (Signed)
Patient left a message stating that the last 2 months she has been cutting the 50 mg Zoloft in half and now is getting ready to cut that in half.   Patient would like an RX for 25 mg called into the pharmacy and she needs 90 day supply .  RX sent

## 2014-02-16 ENCOUNTER — Telehealth: Payer: Self-pay

## 2014-02-16 ENCOUNTER — Telehealth: Payer: Self-pay | Admitting: Family Medicine

## 2014-02-16 MED ORDER — LEVOCETIRIZINE DIHYDROCHLORIDE 5 MG PO TABS: 5.0000 mg | ORAL_TABLET | Freq: Once | ORAL | Status: DC

## 2014-02-16 NOTE — Telephone Encounter (Signed)
Refill- levocetirizine  [facility] [id number]

## 2014-02-16 NOTE — Telephone Encounter (Signed)
Rx sent to pharmacy. Pt is due for f/u. Please call to schedule. LDM

## 2014-02-16 NOTE — Telephone Encounter (Signed)
Rx sent to pharmacy. Pt is due for a f/u LDM

## 2014-02-16 NOTE — Telephone Encounter (Signed)
Informed patient of medication refill and she states that she will have to call back tomorrow to schedule

## 2014-02-17 NOTE — Telephone Encounter (Signed)
Please advise 

## 2014-02-17 NOTE — Telephone Encounter (Signed)
I am willing to cover her meds for a couple months, she does not take much, just schedule her in the next 3 months

## 2014-02-17 NOTE — Telephone Encounter (Signed)
Please schedule follow up

## 2014-02-17 NOTE — Telephone Encounter (Signed)
She requires a 30 min appointment. When do you want me to get her in? Thanks!

## 2014-02-18 NOTE — Telephone Encounter (Signed)
Left message for patient to return my call.

## 2014-02-22 ENCOUNTER — Ambulatory Visit (INDEPENDENT_AMBULATORY_CARE_PROVIDER_SITE_OTHER): Payer: Managed Care, Other (non HMO) | Admitting: Internal Medicine

## 2014-02-22 ENCOUNTER — Encounter: Payer: Self-pay | Admitting: Family Medicine

## 2014-02-22 ENCOUNTER — Encounter: Payer: Self-pay | Admitting: Internal Medicine

## 2014-02-22 VITALS — BP 132/82 | HR 76 | Ht 71.0 in | Wt 261.0 lb

## 2014-02-22 DIAGNOSIS — T8189XA Other complications of procedures, not elsewhere classified, initial encounter: Secondary | ICD-10-CM

## 2014-02-22 DIAGNOSIS — K2100 Gastro-esophageal reflux disease with esophagitis, without bleeding: Secondary | ICD-10-CM

## 2014-02-22 DIAGNOSIS — K649 Unspecified hemorrhoids: Secondary | ICD-10-CM

## 2014-02-22 MED ORDER — DICYCLOMINE HCL 20 MG PO TABS: 20.0000 mg | ORAL_TABLET | ORAL | Status: DC

## 2014-02-22 MED ORDER — PANTOPRAZOLE SODIUM 40 MG PO TBEC: DELAYED_RELEASE_TABLET | ORAL | Status: DC

## 2014-02-22 MED ORDER — ALPRAZOLAM 0.5 MG PO TABS: ORAL_TABLET | ORAL | Status: DC

## 2014-02-22 MED ORDER — HYDROCORTISONE ACETATE 25 MG RE SUPP: 25.0000 mg | Freq: Every day | RECTAL | Status: DC

## 2014-02-22 NOTE — Patient Instructions (Signed)
We have sent the following medications to your pharmacy for you to pick up at your convenience: Xanax 0.5 mg-Take 1/2-1 tablet by mouth three times daily as needed for anxiety Anusol HC suppositories- 1 every night Bentyl 20 mg every morning  Please decrease Zoloft to 25 mg 1 tablet on Monday, Wednesday, Friday.  You have been scheduled for an appointment with Dr Dellia Cloud on Wednesday 03/02/14 at 10:00 am. [address] [place] [address]  CC:Dr Danise Edge

## 2014-02-22 NOTE — Progress Notes (Signed)
Mallory [name] [birth year] [id number]  Note: This dictation was prepared with Dragon digital system. Any transcriptional errors that result from this procedure are unintentional.   History of Present Illness:  This is a 48 year old white female who was previously followed by Dr.Magod, with a history of diarrhea predominant irritable bowel syndrome and gastroesophageal reflux disease controlled on Protonix 40 mg in the morning and ranitidine 300 mg at bedtime with frequent breakthrough symptoms. She is up-to-date on her colonoscopy which was last completed in January 2009 and was normal. Her last upper endoscopy in November 2004 showed mild esophagitis and duodenitis. She had H. pylori positive antibody and was treated with antibiotics and subsequently had an H. pylori breath test in March 2015 which was negative. She is now having acid reflux symptoms again especially since a laparoscopic cholecystectomy by Dr. Donell Beers in April 2014. She is having postcholecystectomy diarrhea. She has been morbidly obese. Her weight is 260 pounds and she is unable to lose weight. She blames the lack of weight loss on Zoloft 50 mg a day for anxiety and depression since  2003.Marland Kitchen She has been able to reduce the dose of Zoloft from 50 mg a day to 25 mg a day but is afraid to stop it. She has Xanax 0.5 mg in place of Zoloft which she takes during the day for anxiety. She is interested in psychological counseling for anxiety and depression. Her husband is seeing Dr.Gutterman and she is interested in an assessment as well. He can also help her taper off her Zoloft. She has a diagnosis of fibromyalgia and interstitial cystitis.    Past Medical History  Diagnosis Date  . Anxiety   . Depression     no counseling  . GERD (gastroesophageal reflux disease)   . Allergy     year round  . Hyperlipidemia 2002  . IBS (irritable bowel syndrome) 2008    improved over last couple of years using Align  . Interstitial cystitis 2006   Dr Marcine Matar  . Pulmonary nodule 3/24/20101    Incidental finding of CT Chest ordered for chest pain  . Personal history of kidney stones 1986  . Asthma     mild, intermittent, allergy triggered  . Obesity   . Fibromyalgia 1997    dx by rheumatology, no recent flares  . Hiatal hernia   . GERD   . DEPRESSION   . Chronic laryngitis   . ALLERGIC RHINITIS   . Pedal edema   . Endometriosis   . Headache(784.0)   . Elevated BP   . Chronic cholecystitis with calculus     cholecystectomy 10/2012  . Arthritis     PAIN AT TIMES IN BOTH KNEES-? ARTHRITIS  . Varicose veins   . H. pylori infection   . Esophagitis   . Internal and external hemorrhoids without complication   . Anal fissure   . Anxiety   . Kidney stones   . Pneumonia   . Urinary tract bacterial infections     Past Surgical History  Procedure Laterality Date  . Dilation and curettage of uterus  2002  . Tonsillectomy    . Polypectomy      uterine  . Cholecystectomy N/A 10/28/2012    Procedure: LAPAROSCOPIC CHOLECYSTECTOMY WITH INTRAOPERATIVE CHOLANGIOGRAM;  Surgeon: Almond Lint, MD;  Location: [facility];  Service: General;  Laterality: N/A;    Allergies  Allergen Reactions  . Cymbalta [Duloxetine Hcl] Nausea Only    Nausea & Fatigue  . Simvastatin  Muscle ache in legs  . Venlafaxine     REACTION: bad dreams    Family history and social history have been reviewed.  Review of Systems:  heartburn hoarseness and cough. Symptoms improved when she eliminates caffeine and so does   The remainder of the 10 point ROS is negative except as outlined in the H&P  Physical Exam: General Appearance Well developed, in no distress, morbidly obese  Eyes  Non icteric  HEENT  Non traumatic, normocephalic  Mouth No lesion, tongue papillated, no cheilosis, normal voice. No cough  Neck Supple without adenopathy, thyroid not enlarged, no carotid bruits, no JVD Lungs Clear to auscultation bilaterally COR Normal S1, normal  S2, regular rhythm, no murmur, quiet precordium Abdomen  obese soft nontender with normoactive bowel sounds. Liver edge is total at the costal margin. No ascites  Rectal  external hemorrhoidal tags. Normal rectal sphincter tone. Soft Hemoccult negative stool  Extremities  No pedal edema Skin No lesions Neurological Alert and oriented x 3 Psychological Normal mood and affect  Assessment and Plan:   Problem #51 48 year old white female with chronic gastroesophageal reflux with breakthrough symptoms only partially controlled on Protonix 40 mg every morning and ranitidine 300 mg p.m. We will alter her regimen and increase the Protonix to 40 mg twice a day in place of the ranitidine. We have discussed weight loss. She will try to take Zoloft  25 mg on Monday, Wednesday and Friday for 2-3 weeks and then discontinue. In its place, she will increase Xanax to 0.5 mg 1/2-1 tablet 3 times a day as needed. She will increase her level of activity in an attempt to lose weight. We will also arrange a psychological referral for her to see Dr Dellia Cloud or his associate. I also mentioned the possibility of gastric bypass. Her mother had one but she is reluctant to go consider  it.   Problem #2 Postcholecystectomy diarrhea. She will start on Bentyl 20 mg in the morning and anusol-HC suppositories for symptomatic hemorrhoids .Consider adding Questran.    Lina Sar 02/22/2014

## 2014-03-01 ENCOUNTER — Telehealth: Payer: Self-pay | Admitting: Family Medicine

## 2014-03-01 DIAGNOSIS — R05 Cough: Secondary | ICD-10-CM

## 2014-03-01 DIAGNOSIS — R059 Cough, unspecified: Secondary | ICD-10-CM

## 2014-03-01 DIAGNOSIS — J45901 Unspecified asthma with (acute) exacerbation: Secondary | ICD-10-CM

## 2014-03-01 MED ORDER — FLUTICASONE-SALMETEROL 250-50 MCG/DOSE IN AEPB: 1.0000 | INHALATION_SPRAY | Freq: Two times a day (BID) | RESPIRATORY_TRACT | Status: DC

## 2014-03-01 NOTE — Telephone Encounter (Signed)
Caller name: Lyndzie Relation to pt: self Call back number: [phone] Pharmacy: [facility]  Reason for call:   Patient states that her asthma/allergies are acting up again and would like to know if dr blyth would refill advair

## 2014-03-02 ENCOUNTER — Ambulatory Visit: Payer: Managed Care, Other (non HMO) | Admitting: Psychology

## 2014-03-14 ENCOUNTER — Encounter: Payer: Self-pay | Admitting: Nurse Practitioner

## 2014-03-14 ENCOUNTER — Ambulatory Visit (INDEPENDENT_AMBULATORY_CARE_PROVIDER_SITE_OTHER): Payer: Managed Care, Other (non HMO) | Admitting: Nurse Practitioner

## 2014-03-14 VITALS — BP 130/90 | Temp 97.5°F | Wt 262.8 lb

## 2014-03-14 DIAGNOSIS — R300 Dysuria: Secondary | ICD-10-CM

## 2014-03-14 DIAGNOSIS — Z23 Encounter for immunization: Secondary | ICD-10-CM

## 2014-03-14 LAB — POCT URINALYSIS DIPSTICK
Bilirubin, UA: NEGATIVE
Glucose, UA: NEGATIVE
KETONES UA: NEGATIVE
Nitrite, UA: NEGATIVE
PH UA: 6
SPEC GRAV UA: 1.02
Urobilinogen, UA: 0.2

## 2014-03-14 MED ORDER — PHENAZOPYRIDINE HCL 200 MG PO TABS: 200.0000 mg | ORAL_TABLET | Freq: Three times a day (TID) | ORAL | Status: DC | PRN

## 2014-03-14 MED ORDER — NITROFURANTOIN MONOHYD MACRO 100 MG PO CAPS: 100.0000 mg | ORAL_CAPSULE | Freq: Two times a day (BID) | ORAL | Status: DC

## 2014-03-14 NOTE — Patient Instructions (Signed)
Start antibiotic. Our office will call if we need to change the antibiotic. Take pyridium to relax bladder, caution: urine tears & sweat will be orange. Do not be alarmed! Sip hydrating fluids (water, juice, colorless soda, decaff tea) every hour to flush kidneys. Return in 1 month to recheck urine or sooner if symptoms do not improve or you feel worse.  Urinary Tract Infection Urinary tract infections (UTIs) can develop anywhere along your urinary tract. Your urinary tract is your body's drainage system for removing wastes and extra water. Your urinary tract includes two kidneys, two ureters, a bladder, and a urethra. Your kidneys are a pair of bean-shaped organs. Each kidney is about the size of your fist. They are located below your ribs, one on each side of your spine. CAUSES Infections are caused by microbes, which are microscopic organisms, including fungi, viruses, and bacteria. These organisms are so small that they can only be seen through a microscope. Bacteria are the microbes that most commonly cause UTIs. SYMPTOMS  Symptoms of UTIs may vary by age and gender of the patient and by the location of the infection. Symptoms in young women typically include a frequent and intense urge to urinate and a painful, burning feeling in the bladder or urethra during urination. Older women and men are more likely to be tired, shaky, and weak and have muscle aches and abdominal pain. A fever may mean the infection is in your kidneys. Other symptoms of a kidney infection include pain in your back or sides below the ribs, nausea, and vomiting. DIAGNOSIS To diagnose a UTI, your caregiver will ask you about your symptoms. Your caregiver also will ask to provide a urine sample. The urine sample will be tested for bacteria and white blood cells. White blood cells are made by your body to help fight infection. TREATMENT  Typically, UTIs can be treated with medication. Because most UTIs are caused by a bacterial  infection, they usually can be treated with the use of antibiotics. The choice of antibiotic and length of treatment depend on your symptoms and the type of bacteria causing your infection. HOME CARE INSTRUCTIONS  If you were prescribed antibiotics, take them exactly as your caregiver instructs you. Finish the medication even if you feel better after you have only taken some of the medication.  Drink enough water and fluids to keep your urine clear or pale yellow.  Avoid caffeine, tea, and carbonated beverages. They tend to irritate your bladder.  Empty your bladder often. Avoid holding urine for long periods of time.  Empty your bladder before and after sexual intercourse.  After a bowel movement, women should cleanse from front to back. Use each tissue only once. SEEK MEDICAL CARE IF:   You have back pain.  You develop a fever.  Your symptoms do not begin to resolve within 3 days. SEEK IMMEDIATE MEDICAL CARE IF:   You have severe back pain or lower abdominal pain.  You develop chills.  You have nausea or vomiting.  You have continued burning or discomfort with urination. MAKE SURE YOU:   Understand these instructions.  Will watch your condition.  Will get help right away if you are not doing well or get worse. Document Released: 03/20/2005 Document Revised: 12/10/2011 Document Reviewed: 07/19/2011 Exit[employer] Patient Information 2014 [employer].  

## 2014-03-14 NOTE — Progress Notes (Signed)
Pre visit review using our clinic review tool, if applicable. No additional management support is needed unless otherwise documented below in the visit note. 

## 2014-03-14 NOTE — Progress Notes (Signed)
   Subjective:    Patient ID: Mallory [name], female    DOB: [birth year], 48 y.o.   MRN: [id number]  Urinary Tract Infection  This is a new problem. The current episode started in the past 7 days. The problem occurs every urination. The problem has been gradually worsening. The quality of the pain is described as burning. The pain is mild. There has been no fever. Associated symptoms include frequency and hesitancy. Pertinent negatives include no chills, discharge, flank pain, hematuria, nausea, urgency or vomiting. Associated symptoms comments: Cloudy urine. She has tried nothing for the symptoms. ICS      Review of Systems  Constitutional: Negative for fever, chills and fatigue.  Gastrointestinal: Negative for nausea, vomiting and abdominal pain.  Genitourinary: Positive for hesitancy and frequency. Negative for urgency, hematuria and flank pain.       Cloudy urine  Musculoskeletal: Negative for back pain.       Objective:   Physical Exam  Vitals reviewed. Constitutional: She is oriented to person, place, and time. She appears well-developed and well-nourished.  HENT:  Head: Normocephalic and atraumatic.  Eyes: Conjunctivae are normal. Right eye exhibits no discharge. Left eye exhibits no discharge.  Cardiovascular: Normal rate.   Pulmonary/Chest: Effort normal. No respiratory distress.  Abdominal: Soft. Bowel sounds are normal. She exhibits no distension. There is no tenderness. There is no rebound and no guarding.  Musculoskeletal: She exhibits no tenderness (no CVA tenderness).  Neurological: She is alert and oriented to person, place, and time.  Skin: Skin is warm and dry.  Psychiatric: She has a normal mood and affect. Her behavior is normal. Thought content normal.          Assessment & Plan:  1. Dysuria - POCT Urinalysis Dipstick-pos leuks, blood, prot - phenazopyridine (PYRIDIUM) 200 MG tablet; Take 1 tablet (200 mg total) by mouth 3 (three) times daily as needed for  pain.  Dispense: 6 tablet; Refill: 0 - nitrofurantoin, macrocrystal-monohydrate, (MACROBID) 100 MG capsule; Take 1 capsule (100 mg total) by mouth 2 (two) times daily.  Dispense: 10 capsule; Refill: 0 - Urine culture  2. Need for prophylactic vaccination and inoculation against influenza

## 2014-03-15 ENCOUNTER — Ambulatory Visit: Payer: Managed Care, Other (non HMO) | Admitting: Family

## 2014-03-15 ENCOUNTER — Telehealth: Payer: Self-pay | Admitting: Internal Medicine

## 2014-03-15 LAB — URINE CULTURE: Colony Count: >=100000

## 2014-03-15 NOTE — Telephone Encounter (Signed)
Generic Librax is OK but it is going to be expensive. #90, 1 po tid ac, 2 refills.

## 2014-03-15 NOTE — Telephone Encounter (Signed)
Dr Juanda Chance, please advise.... Can patient switch back to Librax?

## 2014-03-16 ENCOUNTER — Telehealth: Payer: Self-pay

## 2014-03-16 MED ORDER — CILIDINIUM-CHLORDIAZEPOXIDE 2.5-5 MG PO CAPS: 1.0000 | ORAL_CAPSULE | Freq: Three times a day (TID) | ORAL | Status: DC

## 2014-03-16 NOTE — Telephone Encounter (Signed)
Rx sent for Librax. I have left a message for patient to call back.

## 2014-03-16 NOTE — Telephone Encounter (Signed)
Pt called asking about urine culture results. She states the Macrobid is making her stomach upset and was just wondering if she needed to change ABX. She has an appt on Monday. Please review results and advise.

## 2014-03-16 NOTE — Telephone Encounter (Signed)
I have spoken to patient to advise her of Dr Brodie's recommendations. She verbalizes understanding. 

## 2014-03-16 NOTE — Telephone Encounter (Signed)
LMOM message from [place].

## 2014-03-17 ENCOUNTER — Other Ambulatory Visit: Payer: Self-pay | Admitting: Family Medicine

## 2014-04-08 ENCOUNTER — Other Ambulatory Visit: Payer: Self-pay

## 2014-04-08 MED ORDER — LEVOCETIRIZINE DIHYDROCHLORIDE 5 MG PO TABS: 5.0000 mg | ORAL_TABLET | Freq: Every evening | ORAL | Status: DC

## 2014-04-12 ENCOUNTER — Other Ambulatory Visit: Payer: Self-pay

## 2014-04-12 MED ORDER — LEVOCETIRIZINE DIHYDROCHLORIDE 5 MG PO TABS: 5.0000 mg | ORAL_TABLET | Freq: Every evening | ORAL | Status: DC

## 2014-04-12 NOTE — Telephone Encounter (Signed)
Requesting 90 day supply of Levocetirizine 5 mg tab

## 2014-04-15 ENCOUNTER — Other Ambulatory Visit: Payer: Self-pay | Admitting: Family Medicine

## 2014-04-25 ENCOUNTER — Encounter: Payer: Self-pay | Admitting: Nurse Practitioner

## 2014-04-26 ENCOUNTER — Telehealth: Payer: Self-pay | Admitting: Family Medicine

## 2014-04-26 NOTE — Telephone Encounter (Signed)
Lm on vm to cb °

## 2014-04-26 NOTE — Telephone Encounter (Signed)
OK to switch 

## 2014-04-26 NOTE — Telephone Encounter (Signed)
Pt would like to know if she can switch to dr kim. Pt lives close to our office, and states every time she has appt there ,she must travel 25-30 min.  In addition to that, dr blythe has cut her schedule. Pt has an acute issue she needs to be seen for, and first available was 11/9. 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 Ok w/ you Dr Abner GreenspanBlyth?

## 2014-04-26 NOTE — Telephone Encounter (Signed)
OK with me to switch. 

## 2014-04-26 NOTE — Telephone Encounter (Signed)
Ok. Needs new patient visit - that visit is to review medical hx. Would advise f/u with current PCP until then if possible.

## 2014-05-02 ENCOUNTER — Ambulatory Visit: Payer: Managed Care, Other (non HMO) | Admitting: Family Medicine

## 2014-05-06 ENCOUNTER — Ambulatory Visit (INDEPENDENT_AMBULATORY_CARE_PROVIDER_SITE_OTHER): Payer: Managed Care, Other (non HMO) | Admitting: Family Medicine

## 2014-05-06 ENCOUNTER — Encounter: Payer: Self-pay | Admitting: Family Medicine

## 2014-05-06 VITALS — BP 118/80 | HR 79 | Temp 97.5°F | Ht 71.0 in | Wt 262.8 lb

## 2014-05-06 DIAGNOSIS — E785 Hyperlipidemia, unspecified: Secondary | ICD-10-CM

## 2014-05-06 DIAGNOSIS — F411 Generalized anxiety disorder: Secondary | ICD-10-CM

## 2014-05-06 DIAGNOSIS — E669 Obesity, unspecified: Secondary | ICD-10-CM

## 2014-05-06 DIAGNOSIS — R739 Hyperglycemia, unspecified: Secondary | ICD-10-CM

## 2014-05-06 DIAGNOSIS — K589 Irritable bowel syndrome without diarrhea: Secondary | ICD-10-CM

## 2014-05-06 LAB — LIPID PANEL
CHOL/HDL RATIO: 9
Cholesterol: 292 mg/dL — ABNORMAL HIGH (ref 0–200)
HDL: 33.3 mg/dL — ABNORMAL LOW (ref >39.00)
LDL Cholesterol: 219 mg/dL — ABNORMAL HIGH (ref 0–99)
NONHDL: 258.7
Triglycerides: 198 mg/dL — ABNORMAL HIGH (ref 0.0–149.0)
VLDL: 39.6 mg/dL (ref 0.0–40.0)

## 2014-05-06 LAB — BASIC METABOLIC PANEL
BUN: 14 mg/dL (ref 6–23)
CHLORIDE: 105 meq/L (ref 96–112)
CO2: 25 mEq/L (ref 19–32)
Calcium: 9.2 mg/dL (ref 8.4–10.5)
Creatinine, Ser: 0.9 mg/dL (ref 0.4–1.2)
GFR: 69.04 mL/min (ref >60.00)
Glucose, Bld: 94 mg/dL (ref 70–99)
POTASSIUM: 4.2 meq/L (ref 3.5–5.1)
SODIUM: 140 meq/L (ref 135–145)

## 2014-05-06 LAB — HEMOGLOBIN A1C: Hgb A1c MFr Bld: 5.6 % (ref 4.6–6.5)

## 2014-05-06 NOTE — Patient Instructions (Addendum)
BEFORE YOU LEAVE: -schedule follow up in 1 month -labs   Schedule a visit with a counselor for Cognitive Behavioral Therapy  Wean off of the xanax  Continue the zoloft for now and maybe in 3-6 months after CBT and exercising on a regular basis can then try to wean off    Treatment Options for Obesity:  A comprehensive exercise and diet program are advised and are the initial and longterm/lifelong treatment for obesity.  This is a long and initially often difficult process and it will often take years to get to reach optimal health  As you get healthier you will build extra blood volume and lean tissue that is heavier then fatty tissue and you will hit plateaus in your weight - thus I ADVISE YOU DO NOT FOLLOW YOUR WEIGHT ON A SCALE   The safest and healthiest way to improve you physical and mental health is to: - eat a healthy diet consisting of lots of vegetables, fruits, beans, nuts, seeds, healthy meats such as white chicken and fish and whole grains.  - avoid fried foods, fast food, processed foods, sodas, red meet and other fattening foods.  - get a least 150-300 minutes of aerobic exercise per week.  -reduce stress - counseling, meditation, relaxation to balance other aspects of your life   Medication for Obesity: IF BMI >30 and you have not achieved weight loss through diet and exercise alone, we suggest pharmacologic therapy can be added to diet and exercise.  First Choice:  1)Orlistat (Xenical): -likely the safest in terms of heart and vascular side effects and helps cholesterol - usually recommended for 2 - 4 years -common and or serious side effects include but are not limited to: anaphylaxis, liver toxicity, gas, oily stools, loose stools, fatty stools -150 mg up to 3 times daily only during meals containing fat, diet should be <30% fat -not recommended in: pregnancy, malabsorption, some kidney stones, anorexia or bulemia history  2)Lorcaserin (Belviq) -no long term  safety data -to be discontinued if do not achieve 5% body weight reduction in 12 weeks -common and or serious side effects include but are not limited to: heart disease, anemia, abuse and dependency, depression, suicide, nausea and vomiting, headaches, low blood sugar, dizziness, anemia, fatigue, diarrhea, urinary tract infections, back pain, dry mouth, pain, cognitive impairment 10mg  twice daily -not recommended if pregnant, kidney disease, heart disease, diabetes, depression -I do not advise long term and advise weaning of if not effective and after 1-2 years  3)combination phentermine-topiramate (Qsymia)- for men or post menopausal women or monthly pregnancy test: -can cause fetal anomalies -to be discontinued GRADUALLY if do not achieve 5% body weight reduction in 12 weeks -adverse: -not recommended if: heart disease, breastfeeding, pregnant, hyperthyroid, glaucoma, drug abuse history, renal or liver problems, alcohol use, depression -common and or serious side effects include but are not limited to: metabolic acidosis, kidney stones, bone issues, electrolyte issues, heart attack, suicide, psychosis, dependency and abuse, severe reaction, seizure is stop suddenly, dry mouth, numbness, headache, blurred vision, diarrhea, fatigue, depression, anxiety, pain, poor focus, acid reflux, dry eyes, eye pain, heart arhythmia -I do not advise long term and advise weaning of if not effective and in 1-2 years  If diabetes:  1) Metformin, orlistat or  2)Liraglutide (Victoza) -can cause t cell tumors, anaphylaxis, kidney toxicity, pancreatitis, nausea, vomiting, diarrhea, headache -injected:0.6mg  daily for 1 week, then 1.2mg  daily  Surgery is also an option and [place] has many useful resources if you are  considering this.  Please check out this website if you wish:  [email or web address]       FOR IMPROVED SLEEP AND TO RESET YOUR SLEEP SCHEDULE: []  exercise 30  minutes daily  []  go to bed and wake up at the same time  []  keep bedroom cool, dark and quiet  []  reserve bed for sleep - do not read, watch TV, etc in bed  []  If you toss and turn more then 15-20 minutes get out of bed and list thoughts/do quite activity then go back to bed; repeat as needed; do not worry about when you eventually fall asleep - still get up at the same time and turn on lights and take shower  [] get counseling  []  some people find that a half dose of benadryl, melatonin, tylenol pm or unisom on a few nights per week is helpful initially for a few weeks  [] seek help and treat any depression or anxiety  [] prescription strength sleep medications should only be used in severe cases of insomnia if other measures fail and should be used sparingly

## 2014-05-06 NOTE — Progress Notes (Signed)
HPI:  Mallory [name] is here to establish care. Used to see Dr. Abner GreenspanBlyth. Last PCP and physical: sees a gynecologist for endometrial pain  Has the following chronic problems that require follow up and concerns today:  HLD/Prediabetes: -reports FASTING today and wants to recheck  GAD/Depression: -on paxil in the past but gained a lot of weight on this -she is very concerned with her weight -she is on 50mg  of zoloft daily now -also takes xanax 1/2 tablet about 3 nights per week  Obesity: -has struggled with this for a long time -feels like she doe snot eat much -very frustrated with her weight and reports was very offended that her gastroenterologist suggested bariatric surgery as she thought this is for people who eat too much -she reports she does not eat hardly anything -does not exercise  IBS/GERD: -seeing Dr. Juanda ChanceBrodie for this -librax prn -wants ttg check for celiacs  Mild intermittent asthma and allergies: -prn alb, advair in the past  ROS negative for unless reported above: fevers, unintentional weight loss, hearing or vision loss, chest pain, palpitations, struggling to breath, hemoptysis, melena, hematochezia, hematuria, falls, loc, si, thoughts of self harm  Past Medical History  Diagnosis Date  . Anxiety   . Depression     no counseling  . GERD (gastroesophageal reflux disease)   . Allergy     year round  . Hyperlipidemia 2002  . IBS (irritable bowel syndrome) 2008    improved over last couple of years using Align  . Interstitial cystitis 2006    Dr Marcine MatarStephen Dahlstedt  . Pulmonary nodule 3/24/20101    Incidental finding of CT Chest ordered for chest pain  . Personal history of kidney stones 1986  . Asthma     mild, intermittent, allergy triggered  . Obesity   . Fibromyalgia 1997    dx by rheumatology, no recent flares  . Hiatal hernia   . GERD   . DEPRESSION   . Chronic laryngitis   . ALLERGIC RHINITIS   . Pedal edema   . Endometriosis   .  Headache(784.0)   . Elevated BP   . Chronic cholecystitis with calculus     cholecystectomy 10/2012  . Arthritis     PAIN AT TIMES IN BOTH KNEES-? ARTHRITIS  . Varicose veins   . H. pylori infection   . Esophagitis   . Internal and external hemorrhoids without complication   . Anal fissure   . Anxiety   . Kidney stones   . Pneumonia   . Urinary tract bacterial infections     Past Surgical History  Procedure Laterality Date  . Dilation and curettage of uterus  2002  . Tonsillectomy    . Polypectomy      uterine  . Cholecystectomy N/A 10/28/2012    Procedure: LAPAROSCOPIC CHOLECYSTECTOMY WITH INTRAOPERATIVE CHOLANGIOGRAM;  Surgeon: Almond LintFaera Byerly, MD;  Location: [facility];  Service: General;  Laterality: N/A;    Family History  Problem Relation Age of Onset  . Diabetes Mother   . Hyperlipidemia Mother   . Hypertension Mother   . Breast cancer Mother   . Colon cancer Maternal Aunt   . Arthritis Paternal Grandmother   . Heart disease Paternal Grandmother   . Stroke Father [id number]  . Migraines Sister   . Colon cancer Maternal Grandmother   . Lung cancer Maternal Grandfather   . Ovarian cancer Mother   . Irritable bowel syndrome Mother     History   Social History  .  Marital Status: Married    Spouse Name: N/A    Number of Children: 2  . Years of Education: N/A   Occupational History  . Advertising account plannernsurance Agent    Social History Main Topics  . Smoking status: Former Smoker    Types: Cigarettes    Quit date: 06/24/1985  . Smokeless tobacco: Never Used     Comment: when a teenager  . Alcohol Use: No  . Drug Use: No  . Sexual Activity:    Partners: Male   Other Topics Concern  . None   Social History Narrative   Married, has one grown son and one grown daughter.   Has 2 grandchildren.   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    Current outpatient prescriptions: ALPRAZolam (XANAX) 0.5 MG tablet, Take 1/2-1 tablet by mouth three times daily as  needed for anxiety., Disp: 45 tablet, Rfl: 1;  Cholecalciferol (VITAMIN D) 2000 UNITS tablet, Take 2,000 Units by mouth every evening., Disp: , Rfl: ;  clidinium-chlordiazePOXIDE (LIBRAX) 5-2.5 MG per capsule, Take 1 capsule by mouth 3 (three) times daily before meals., Disp: 90 capsule, Rfl: 2 fluticasone (FLONASE) 50 MCG/ACT nasal spray, USE 2 SPRAYS IN BOTH NOSTRILS DAILY, Disp: 16 g, Rfl: 2;  hydrocortisone (ANUSOL-HC) 25 MG suppository, Place 1 suppository (25 mg total) rectally at bedtime., Disp: 12 suppository, Rfl: 0;  levocetirizine (XYZAL) 5 MG tablet, TAKE 1 TABLET BY MOUTH EVERY DAY, Disp: 30 tablet, Rfl: 0;  montelukast (SINGULAIR) 10 MG tablet, Take 1 tablet (10 mg total) by mouth at bedtime., Disp: 90 tablet, Rfl: 1 Norethindrone-Ethinyl Estradiol-Fe Biphas (LO LOESTRIN FE) 1 MG-10 MCG / 10 MCG tablet, Take 1 tablet by mouth daily. TAKES AT BEDTIME, Disp: , Rfl: ;  omega-3 acid ethyl esters (LOVAZA) 1 G capsule, Take 1 g by mouth at bedtime., Disp: , Rfl: ;  pantoprazole (PROTONIX) 40 MG tablet, TAKE 1 TABLET BY MOUTH TWICE DAILY, Disp: 60 tablet, Rfl: 2;  Probiotic Product (ALIGN) 4 MG CAPS, Take 4 mg by mouth daily. [employer] [facility], Disp: , Rfl:  promethazine (PHENERGAN) 25 MG tablet, Take 1 tablet (25 mg total) by mouth every 6 (six) hours as needed for nausea., Disp: 30 tablet, Rfl: 0;  ranitidine (ZANTAC) 300 MG tablet, Take 1 tablet (300 mg total) by mouth at bedtime., Disp: 90 tablet, Rfl: 1;  sertraline (ZOLOFT) 50 MG tablet, Take 50 mg by mouth daily., Disp: , Rfl: ;  Triamcinolone Acetonide (NASACORT ALLERGY 24HR NA), Place 2 sprays into the nose daily., Disp: , Rfl:  acyclovir (ZOVIRAX) 400 MG tablet, TAKE 1 TABLET BY MOUTH 5 TIMES DAILY, Disp: 20 tablet, Rfl: 0;  albuterol (PROVENTIL HFA;VENTOLIN HFA) 108 (90 BASE) MCG/ACT inhaler, Inhale 2 puffs into the lungs every 6 (six) hours as needed for wheezing., Disp: 1 Inhaler, Rfl: 2  EXAM:  Filed Vitals:   05/06/14 0934  BP:  118/80  Pulse: 79  Temp: 97.5 F (36.4 C)    Body mass index is 36.67 kg/(m^2).  GENERAL: vitals reviewed and listed above, alert, oriented, appears well hydrated and in no acute distress  HEENT: atraumatic, conjunttiva clear, no obvious abnormalities on inspection of external nose and ears  NECK: no obvious masses on inspection  LUNGS: clear to auscultation bilaterally, no wheezes, rales or rhonchi, good air movement  CV: HRRR, no peripheral edema  MS: moves all extremities without noticeable abnormality  PSYCH: pleasant and cooperative, no obvious depression or anxiety  ASSESSMENT AND PLAN:  Discussed the following assessment and plan:  Hyperglycemia - Plan: Hemoglobin A1c  Hyperlipemia - Plan: Lipid Panel  GAD (generalized anxiety disorder) -she wants to get off medications for this -advised CBT, 30 minutes of exercise daily, wean off xanax, and then in 3-6 months wean of lexapro  Obesity - Plan: Basic metabolic panel -lifestyle recs, other options for weight loss advised -needs regular healthy meals and regular exercise  IBS (irritable bowel syndrome) - Plan: t-Transglutaminase (tTG) IgG per her request  Complete review of history at follow up in 1 month - ran out of time this visit.  We reviewed the PMH, PSH, FH, SH, Meds and Allergies. -We provided refills for any medications we will prescribe as needed. -We addressed current concerns per orders and patient instructions. -We have asked for records for pertinent exams, studies, vaccines and notes from previous providers. -We have advised patient to follow up per instructions below.   -Patient advised to return or notify a doctor immediately if symptoms worsen or persist or new concerns arise.  Patient Instructions  BEFORE YOU LEAVE:  Treatment Options for Obesity:  A comprehensive exercise and diet program are advised and are the initial and longterm/lifelong treatment for obesity.  This is a long and  initially often difficult process and it will often take years to get to reach optimal health  As you get healthier you will build extra blood volume and lean tissue that is heavier then fatty tissue and you will hit plateaus in your weight - thus I ADVISE YOU DO NOT FOLLOW YOUR WEIGHT ON A SCALE   The safest and healthiest way to improve you physical and mental health is to: - eat a healthy diet consisting of lots of vegetables, fruits, beans, nuts, seeds, healthy meats such as white chicken and fish and whole grains.  - avoid fried foods, fast food, processed foods, sodas, red meet and other fattening foods.  - get a least 150-300 minutes of aerobic exercise per week.  -reduce stress - counseling, meditation, relaxation to balance other aspects of your life   Medication for Obesity: IF BMI >30 and you have not achieved weight loss through diet and exercise alone, we suggest pharmacologic therapy can be added to diet and exercise.  First Choice:  1)Orlistat (Xenical): -likely the safest in terms of heart and vascular side effects and helps cholesterol - usually recommended for 2 - 4 years -common and or serious side effects include but are not limited to: anaphylaxis, liver toxicity, gas, oily stools, loose stools, fatty stools -150 mg up to 3 times daily only during meals containing fat, diet should be <30% fat -not recommended in: pregnancy, malabsorption, some kidney stones, anorexia or bulemia history  2)Lorcaserin (Belviq) -no long term safety data -to be discontinued if do not achieve 5% body weight reduction in 12 weeks -common and or serious side effects include but are not limited to: heart disease, anemia, abuse and dependency, depression, suicide, nausea and vomiting, headaches, low blood sugar, dizziness, anemia, fatigue, diarrhea, urinary tract infections, back pain, dry mouth, pain, cognitive impairment 10mg  twice daily -not recommended if pregnant, kidney disease, heart  disease, diabetes, depression -I do not advise long term and advise weaning of if not effective and after 1-2 years  3)combination phentermine-topiramate (Qsymia)- for men or post menopausal women or monthly pregnancy test: -can cause fetal anomalies -to be discontinued GRADUALLY if do not achieve 5% body weight reduction in 12 weeks -adverse: -not recommended if: heart disease, breastfeeding, pregnant,  hyperthyroid, glaucoma, drug abuse history, renal or liver problems, alcohol use, depression -common and or serious side effects include but are not limited to: metabolic acidosis, kidney stones, bone issues, electrolyte issues, heart attack, suicide, psychosis, dependency and abuse, severe reaction, seizure is stop suddenly, dry mouth, numbness, headache, blurred vision, diarrhea, fatigue, depression, anxiety, pain, poor focus, acid reflux, dry eyes, eye pain, heart arhythmia -I do not advise long term and advise weaning of if not effective and in 1-2 years  If diabetes:  1) Metformin, orlistat or  2)Liraglutide (Victoza) -can cause t cell tumors, anaphylaxis, kidney toxicity, pancreatitis, nausea, vomiting, diarrhea, headache -injected:0.6mg  daily for 1 week, then 1.2mg  daily  Surgery is also an option and [facility] has many useful resources if you are considering this.  Please check out this website if you wish:  [email or web address]       FOR IMPROVED SLEEP AND TO RESET YOUR SLEEP SCHEDULE: []  exercise 30 minutes daily  []  go to bed and wake up at the same time  []  keep bedroom cool, dark and quiet  []  reserve bed for sleep - do not read, watch TV, etc in bed  []  If you toss and turn more then 15-20 minutes get out of bed and list thoughts/do quite activity then go back to bed; repeat as needed; do not worry about when you eventually fall asleep - still get up at the same time and turn on lights and take shower  [] get counseling  []  some  people find that a half dose of benadryl, melatonin, tylenol pm or unisom on a few nights per week is helpful initially for a few weeks  [] seek help and treat any depression or anxiety  [] prescription strength sleep medications should only be used in severe cases of insomnia if other measures fail and should be used sparingly      KIM, Dahlia Client R.

## 2014-05-06 NOTE — Progress Notes (Signed)
Pre visit review using our clinic review tool, if applicable. No additional management support is needed unless otherwise documented below in the visit note. 

## 2014-05-10 ENCOUNTER — Telehealth: Payer: Self-pay | Admitting: Family Medicine

## 2014-05-10 NOTE — Telephone Encounter (Signed)
Patient informed. 

## 2014-05-10 NOTE — Telephone Encounter (Signed)
Was waiting on her celiac screen results. These appear to still be pending. Other labs show elevated cholesterol - the best treatment for this is at least 300 minutes of CV exercise per week and a healthy low fat diet, along with a statin. I advise follow up to discuss treatment. It looks like this is already scheduled.

## 2014-05-10 NOTE — Telephone Encounter (Signed)
Pt would like blood work results °

## 2014-05-11 ENCOUNTER — Encounter: Payer: Self-pay | Admitting: Family Medicine

## 2014-05-27 ENCOUNTER — Ambulatory Visit: Payer: Managed Care, Other (non HMO) | Admitting: Family Medicine

## 2014-05-29 ENCOUNTER — Other Ambulatory Visit: Payer: Self-pay | Admitting: Family Medicine

## 2014-05-30 ENCOUNTER — Ambulatory Visit (INDEPENDENT_AMBULATORY_CARE_PROVIDER_SITE_OTHER): Payer: Managed Care, Other (non HMO) | Admitting: Family Medicine

## 2014-05-30 ENCOUNTER — Encounter: Payer: Self-pay | Admitting: Family Medicine

## 2014-05-30 VITALS — BP 118/78 | HR 79 | Temp 97.3°F | Ht 71.0 in | Wt 264.4 lb

## 2014-05-30 DIAGNOSIS — J4520 Mild intermittent asthma, uncomplicated: Secondary | ICD-10-CM | POA: Insufficient documentation

## 2014-05-30 DIAGNOSIS — E785 Hyperlipidemia, unspecified: Secondary | ICD-10-CM

## 2014-05-30 DIAGNOSIS — R350 Frequency of micturition: Secondary | ICD-10-CM | POA: Diagnosis not present

## 2014-05-30 LAB — POCT URINALYSIS DIPSTICK
BILIRUBIN UA: NEGATIVE
Glucose, UA: NEGATIVE
Ketones, UA: NEGATIVE
NITRITE UA: NEGATIVE
PH UA: 5
Protein, UA: NEGATIVE
Spec Grav, UA: 1.015
Urobilinogen, UA: 0.2

## 2014-05-30 MED ORDER — ATORVASTATIN CALCIUM 20 MG PO TABS: 20.0000 mg | ORAL_TABLET | Freq: Every day | ORAL | Status: DC

## 2014-05-30 MED ORDER — CIPROFLOXACIN HCL 500 MG PO TABS: 500.0000 mg | ORAL_TABLET | Freq: Two times a day (BID) | ORAL | Status: DC

## 2014-05-30 NOTE — Addendum Note (Signed)
Addended by: Johnella MoloneyFUNDERBURK, JO A on: 05/30/2014 08:29 AM   Modules accepted: Orders

## 2014-05-30 NOTE — Progress Notes (Addendum)
t HPI:  Acute visit for:  Dysuria: -started about 1 month ago, but has IC, so never knows if has UTI or not -symptoms: urinary frequency, dysuria, urgency -denies: fevers, vomiting, vaginal discharge, flank pain, hematuria -FDLMP: few years - sees gyn and on continuous hormones for endometriosis  HLD: -reports maybe leg cramps with simvastatin -agreeable to trial different statin -30 minutes stationary bike most days -diet is healthy  Depression and Anxiety: -has weaned off the xanax -on lexapro -occ panic attacks, Dr. Juanda Chance rxd -denies: depression, SI, thoughts self harm  Has appt set to see her gyn for annual exam  ROS: See pertinent positives and negatives per HPI.  Past Medical History  Diagnosis Date  . Anxiety   . Depression     no counseling  . GERD (gastroesophageal reflux disease)   . Allergy     year round  . Hyperlipidemia 2002  . IBS (irritable bowel syndrome) 2008    improved over last couple of years using Align  . Interstitial cystitis 2006    Dr Marcine Matar  . Pulmonary nodule 3/24/20101    Incidental finding of CT Chest ordered for chest pain  . Personal history of kidney stones 1986  . Asthma     mild, intermittent, allergy triggered  . Obesity   . Fibromyalgia 1997    dx by rheumatology, no recent flares  . Hiatal hernia   . Chronic laryngitis   . Pedal edema   . Endometriosis   . Headache(784.0)   . Elevated BP   . Chronic cholecystitis with calculus     cholecystectomy 10/2012  . Arthritis     PAIN AT TIMES IN BOTH KNEES-? ARTHRITIS  . Varicose veins   . H. pylori infection   . Esophagitis   . Internal and external hemorrhoids without complication   . Anal fissure   . Pneumonia   . Urinary tract bacterial infections     Past Surgical History  Procedure Laterality Date  . Dilation and curettage of uterus  2002  . Tonsillectomy    . Polypectomy      uterine  . Cholecystectomy N/A 10/28/2012    Procedure: LAPAROSCOPIC  CHOLECYSTECTOMY WITH INTRAOPERATIVE CHOLANGIOGRAM;  Surgeon: Almond Lint, MD;  Location: [facility];  Service: General;  Laterality: N/A;    Family History  Problem Relation Age of Onset  . Diabetes Mother   . Hyperlipidemia Mother   . Hypertension Mother   . Breast cancer Mother   . Colon cancer Maternal Aunt   . Arthritis Paternal Grandmother   . Heart disease Paternal Grandmother   . Stroke Father 110  . Migraines Sister   . Colon cancer Maternal Grandmother   . Lung cancer Maternal Grandfather   . Ovarian cancer Mother   . Irritable bowel syndrome Mother     History   Social History  . Marital Status: Married    Spouse Name: N/A    Number of Children: 2  . Years of Education: N/A   Occupational History  . Advertising account planner    Social History Main Topics  . Smoking status: Former Smoker    Types: Cigarettes    Quit date: 06/24/1985  . Smokeless tobacco: Never Used     Comment: when a teenager  . Alcohol Use: No  . Drug Use: No  . Sexual Activity:    Partners: Male   Other Topics Concern  . None   Social History Narrative   Married, has one grown son and  one grown daughter.   Has 2 grandchildren.   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    Current outpatient prescriptions: albuterol (PROVENTIL HFA;VENTOLIN HFA) 108 (90 BASE) MCG/ACT inhaler, Inhale 2 puffs into the lungs every 6 (six) hours as needed for wheezing., Disp: 1 Inhaler, Rfl: 2;  ALPRAZolam (XANAX) 0.5 MG tablet, Take 1/2-1 tablet by mouth three times daily as needed for anxiety., Disp: 45 tablet, Rfl: 1;  Cholecalciferol (VITAMIN D) 2000 UNITS tablet, Take 2,000 Units by mouth every evening., Disp: , Rfl:  clidinium-chlordiazePOXIDE (LIBRAX) 5-2.5 MG per capsule, Take 1 capsule by mouth 3 (three) times daily before meals., Disp: 90 capsule, Rfl: 2;  fluticasone (FLONASE) 50 MCG/ACT nasal spray, USE 2 SPRAYS IN BOTH NOSTRILS DAILY, Disp: 16 g, Rfl: 2;  hydrocortisone (ANUSOL-HC) 25  MG suppository, Place 1 suppository (25 mg total) rectally at bedtime., Disp: 12 suppository, Rfl: 0 levocetirizine (XYZAL) 5 MG tablet, TAKE 1 TABLET BY MOUTH EVERY DAY, Disp: 30 tablet, Rfl: 0;  montelukast (SINGULAIR) 10 MG tablet, Take 1 tablet (10 mg total) by mouth at bedtime., Disp: 90 tablet, Rfl: 1;  Norethindrone-Ethinyl Estradiol-Fe Biphas (LO LOESTRIN FE) 1 MG-10 MCG / 10 MCG tablet, Take 1 tablet by mouth daily. TAKES AT BEDTIME, Disp: , Rfl:  omega-3 acid ethyl esters (LOVAZA) 1 G capsule, Take 1 g by mouth at bedtime., Disp: , Rfl: ;  pantoprazole (PROTONIX) 40 MG tablet, TAKE 1 TABLET BY MOUTH TWICE DAILY, Disp: 60 tablet, Rfl: 2;  Probiotic Product (ALIGN) 4 MG CAPS, Take 4 mg by mouth daily. [facility], Disp: , Rfl: ;  promethazine (PHENERGAN) 25 MG tablet, Take 1 tablet (25 mg total) by mouth every 6 (six) hours as needed for nausea., Disp: 30 tablet, Rfl: 0 ranitidine (ZANTAC) 300 MG tablet, Take 1 tablet (300 mg total) by mouth at bedtime., Disp: 90 tablet, Rfl: 1;  sertraline (ZOLOFT) 50 MG tablet, Take 50 mg by mouth daily., Disp: , Rfl: ;  Zinc Sulfate (ZINC 15 PO), Take by mouth., Disp: , Rfl: ;  atorvastatin (LIPITOR) 20 MG tablet, Take 1 tablet (20 mg total) by mouth daily., Disp: 90 tablet, Rfl: 3 ciprofloxacin (CIPRO) 500 MG tablet, Take 1 tablet (500 mg total) by mouth 2 (two) times daily., Disp: 6 tablet, Rfl: 0  EXAM:  Filed Vitals:   05/30/14 0812  BP: 118/78  Pulse: 79  Temp: 97.3 F (36.3 C)    Body mass index is 36.89 kg/(m^2).  GENERAL: vitals reviewed and listed above, alert, oriented, appears well hydrated and in no acute distress  HEENT: atraumatic, conjunttiva clear, no obvious abnormalities on inspection of external nose and ears  NECK: no obvious masses on inspection  LUNGS: clear to auscultation bilaterally, no wheezes, rales or rhonchi, good air movement  CV: HRRR, no peripheral edema  ABD: BS+, soft, NTTP  MS: moves all  extremities without noticeable abnormality  PSYCH: pleasant and cooperative, no obvious depression or anxiety  ASSESSMENT AND PLAN:  Discussed the following assessment and plan:  Mild intermittent asthma, uncomplicated  Urinary frequency - Plan: POC Urinalysis Dipstick, ciprofloxacin (CIPRO) 500 MG tablet, Culture, Urine  Hyperlipemia - Plan: atorvastatin (LIPITOR) 20 MG tablet  -udip with blood and leuks -tx with cipro emirically, culture pending -ok to keep small amount benzo on hand for rare panic attacks - she reports uses <10 per year and Dr. Juanda ChanceBrodie rxd this year -discussed options for tx of HLD and risks, opted for trial of different  statin and close follow up in 3 months, every other day dosing if leg cramps -also stressed importance 300 minutes per week CV exercise and healthy diet - she is trying to cut out sodas, sweets, red meats and eat a more plant based diet -Patient advised to return or notify a doctor immediately if symptoms worsen or persist or new concerns arise.  There are no Patient Instructions on file for this visit.   Kriste BasqueKIM, HANNAH R.

## 2014-05-30 NOTE — Addendum Note (Signed)
Addended by: Terressa KoyanagiKIM, Abdulmalik Darco R on: 05/30/2014 08:43 AM   Modules accepted: Orders, Level of Service

## 2014-05-30 NOTE — Progress Notes (Signed)
Pre visit review using our clinic review tool, if applicable. No additional management support is needed unless otherwise documented below in the visit note. 

## 2014-05-31 LAB — URINE CULTURE

## 2014-06-03 ENCOUNTER — Ambulatory Visit: Payer: Managed Care, Other (non HMO) | Admitting: Family Medicine

## 2014-06-07 ENCOUNTER — Encounter: Payer: Self-pay | Admitting: Cardiology

## 2014-06-19 ENCOUNTER — Encounter: Payer: Self-pay | Admitting: Family Medicine

## 2014-06-19 ENCOUNTER — Other Ambulatory Visit: Payer: Self-pay | Admitting: Physician Assistant

## 2014-06-20 MED ORDER — ACYCLOVIR 400 MG PO TABS: 400.0000 mg | ORAL_TABLET | Freq: Every day | ORAL | Status: DC

## 2014-06-20 NOTE — Telephone Encounter (Signed)
Rx done and pt notified via Mychart.

## 2014-06-20 NOTE — Telephone Encounter (Signed)
Medication Detail      Disp Refills Start End     acyclovir (ZOVIRAX) 400 MG tablet 20 tablet 3 06/20/2014     Sig - Route: Take 1 tablet (400 mg total) by mouth 5 (five) times daily. For episodic treatment of cold sores - Oral    E-Prescribing Status: Receipt confirmed by pharmacy (06/20/2014 9:17 AM EST)

## 2014-06-21 ENCOUNTER — Other Ambulatory Visit: Payer: Self-pay | Admitting: *Deleted

## 2014-06-21 MED ORDER — PANTOPRAZOLE SODIUM 40 MG PO TBEC: DELAYED_RELEASE_TABLET | ORAL | Status: DC

## 2014-06-26 IMAGING — CR DG CHEST 2V
2 series · 2 of 2 positions shown · non-contrast
Comparison: Chest radiograph June 11, 2009; chest CT January 23, 2011

CLINICAL DATA: Preoperative evaluation; history of previous
pulmonary nodular lesions

CHEST - 2 VIEW

[w chest pa]
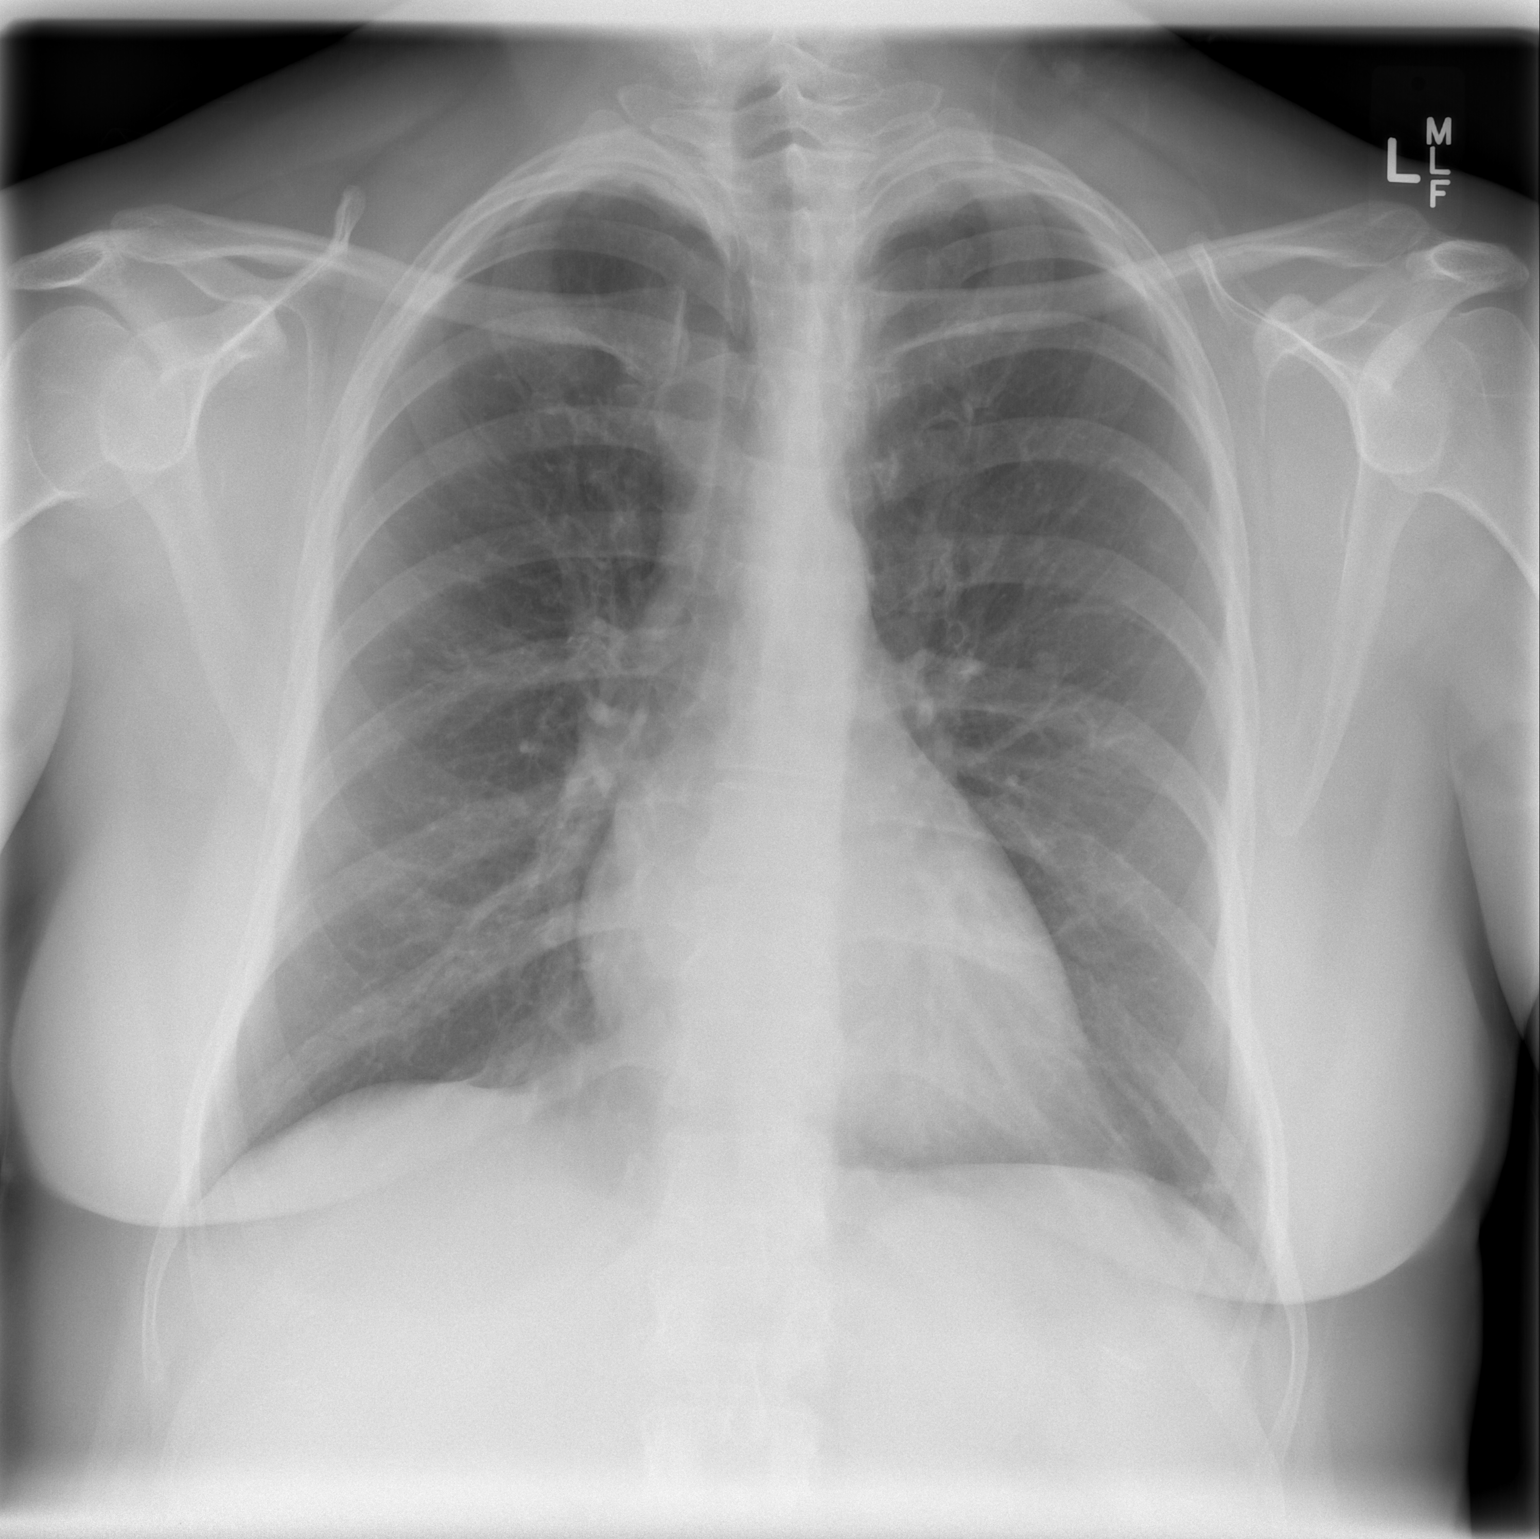

[w chest lat]
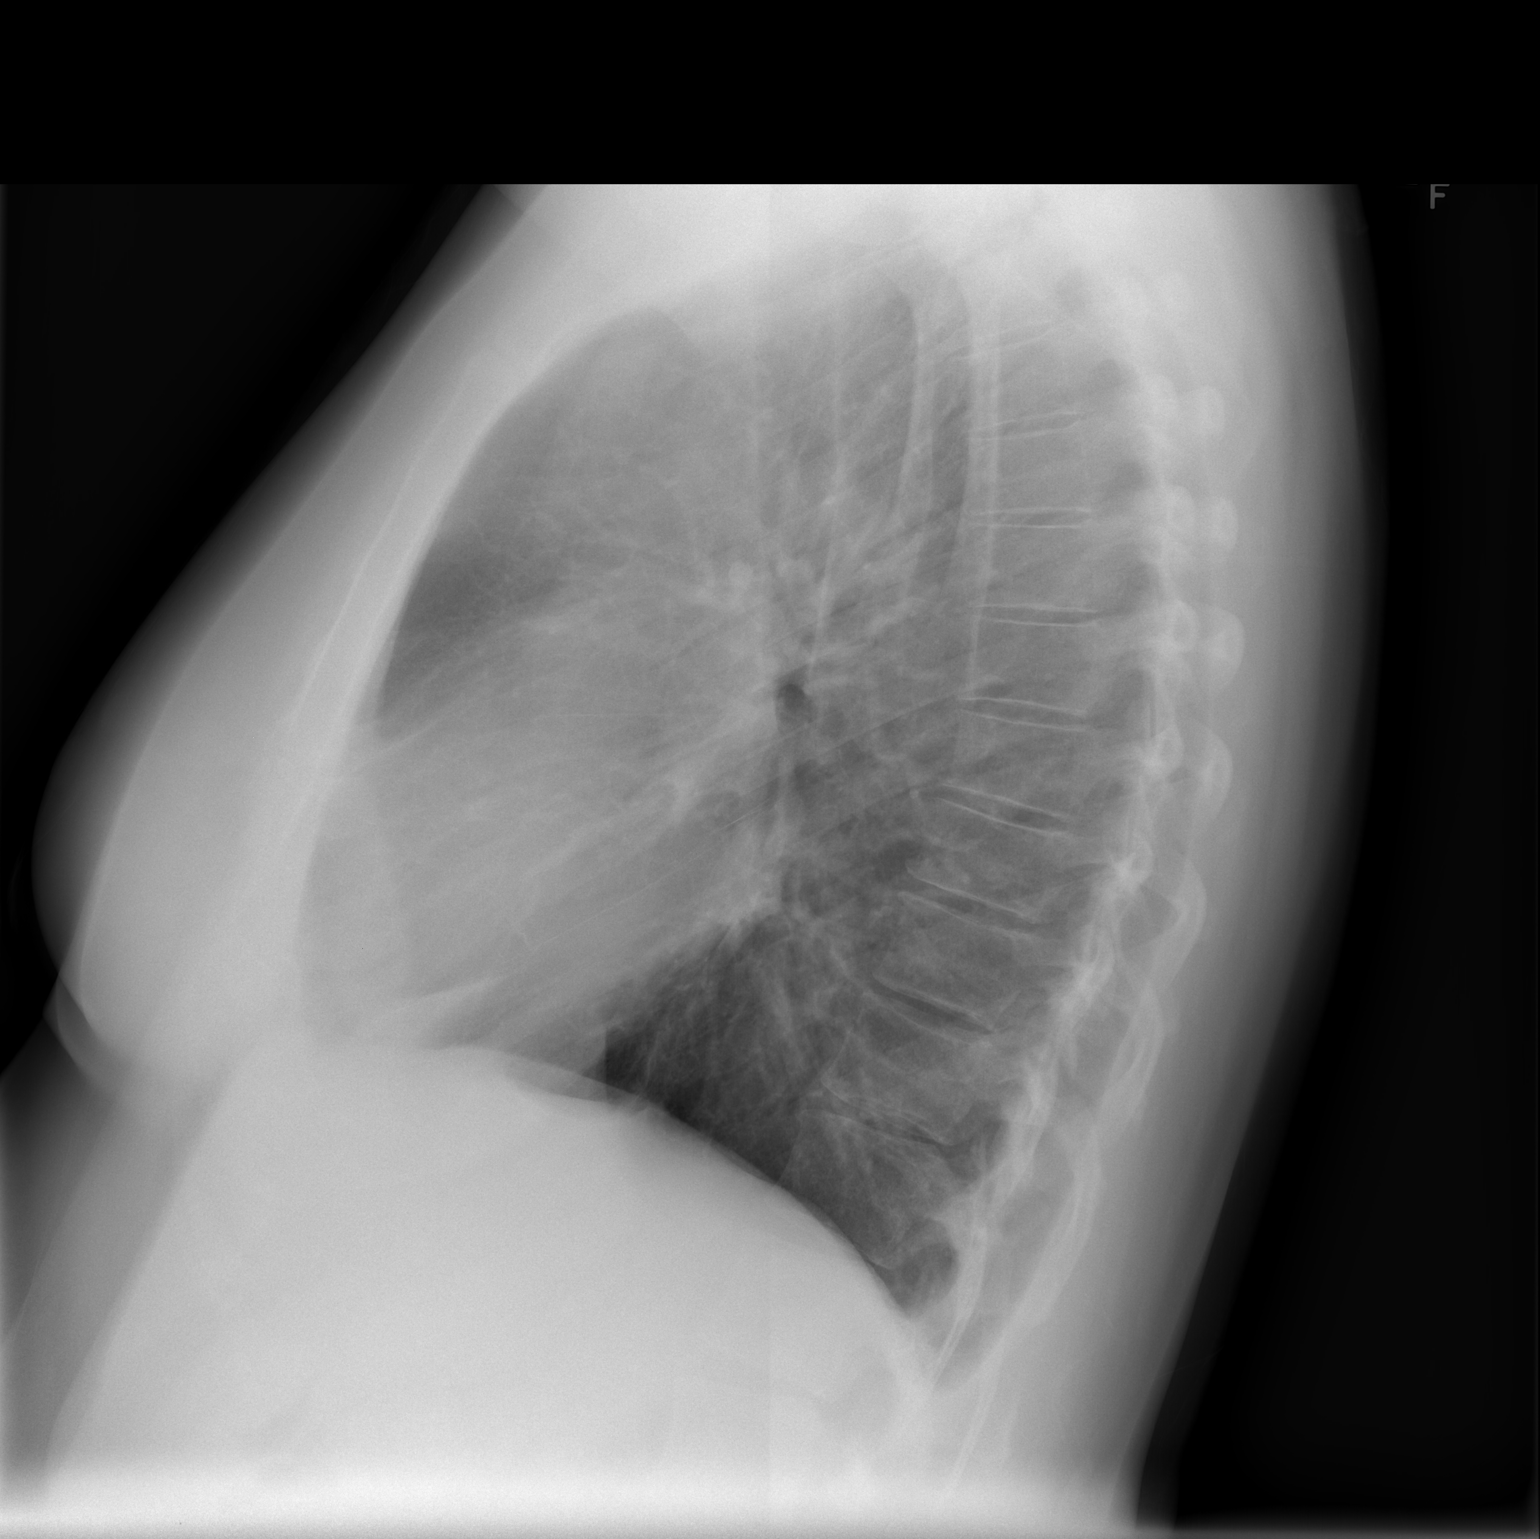

[2 of 2 positions shown; findings below may reference images not displayed]

FINDINGS: There is a stable 6 mm nodular opacity in the left
midlung lateral to the left hilum.  There is no edema or
consolidation.  Heart size and pulmonary vascularity are normal.
No adenopathy.  No bone lesions.
IMPRESSION: Stable nodular opacity left mid lung.  The stability of this lesion
over greater than 2-year time span is consistent with benign
etiology.  No edema or consolidation.

## 2014-07-02 IMAGING — RF DG CHOLANGIOGRAM OPERATIVE
1 series · 4 of 4 positions shown · non-contrast
Comparison: None.

CLINICAL DATA: Gallstones

INTRAOPERATIVE CHOLANGIOGRAM
TECHNIQUE: Cholangiographic images from the C-arm fluoroscopic
device were submitted for interpretation post-operatively.  Please
see the procedural report for the amount of contrast and the
fluoroscopy time utilized.

[Series 1: run · 4 of 82 frames shown]
[frame 13/82]
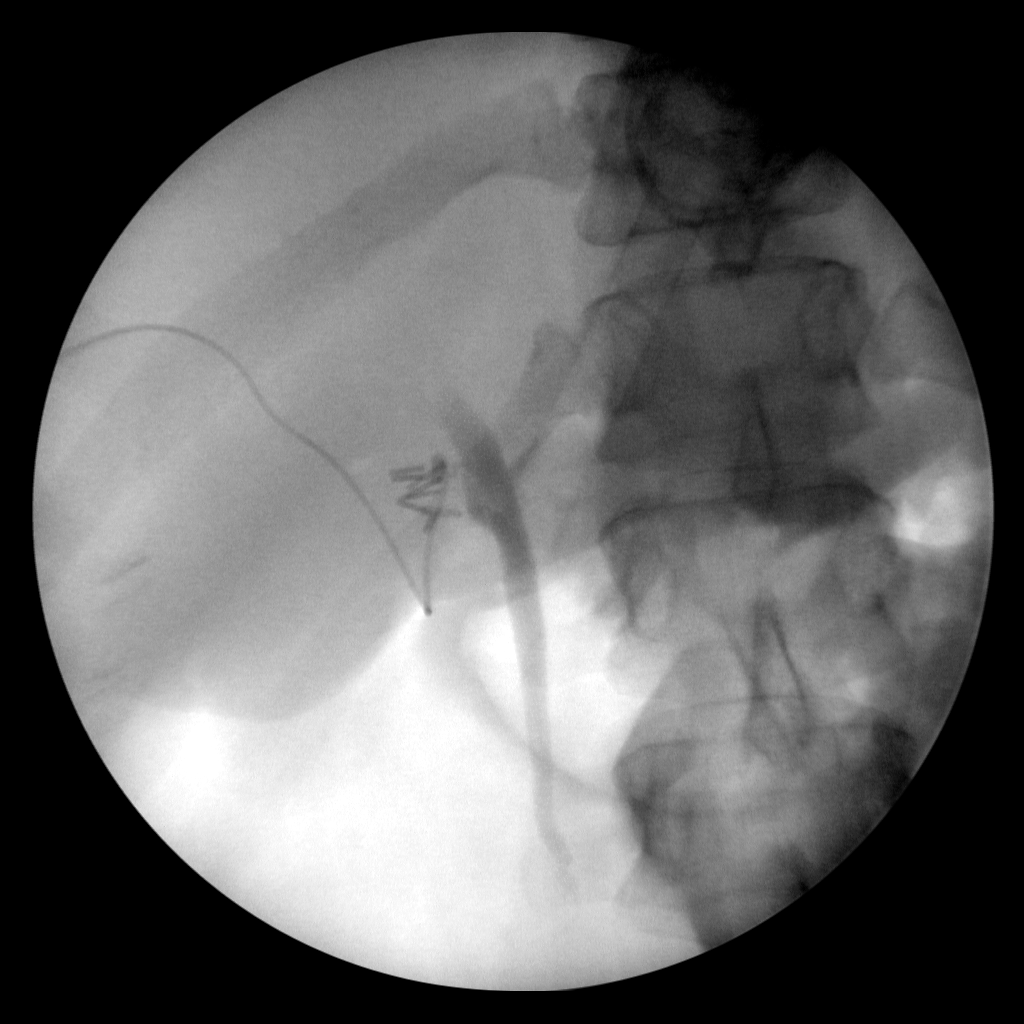
[frame 42/82]
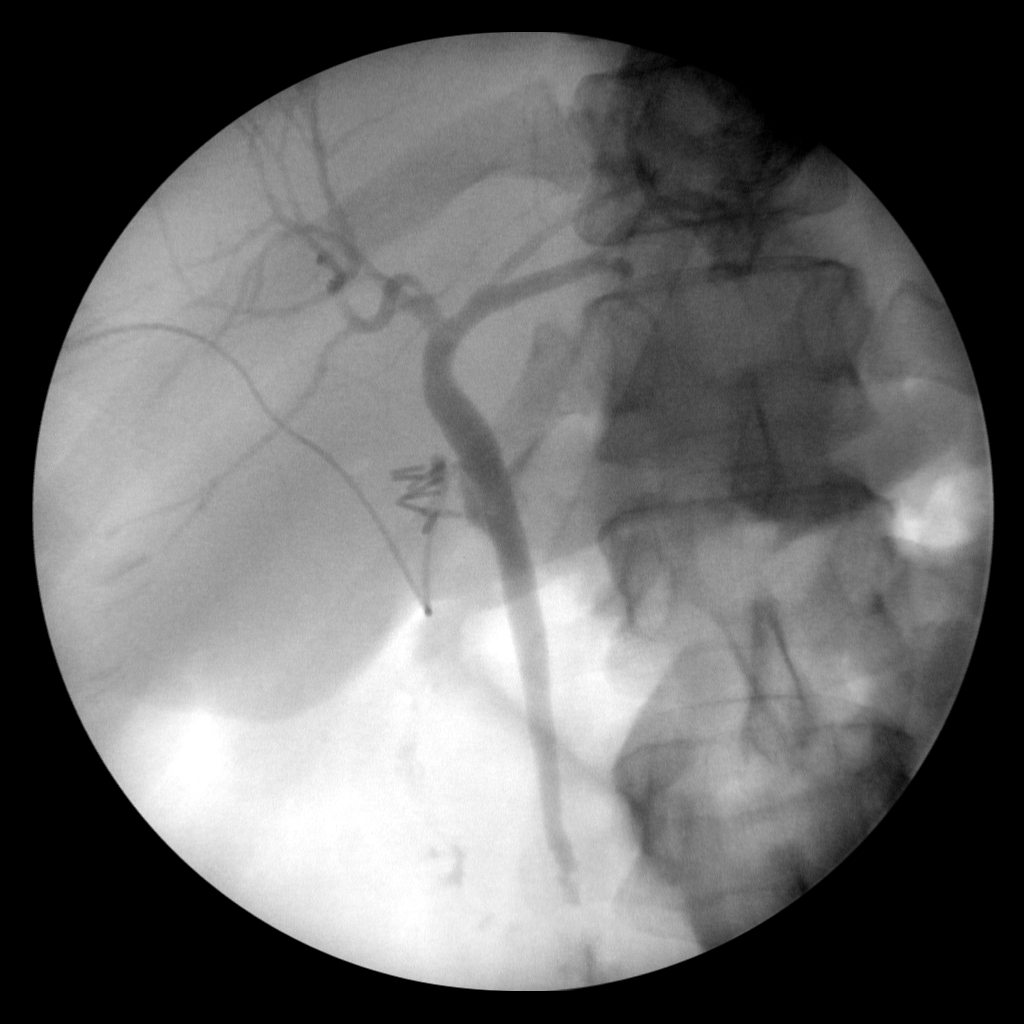
[frame 67/82]
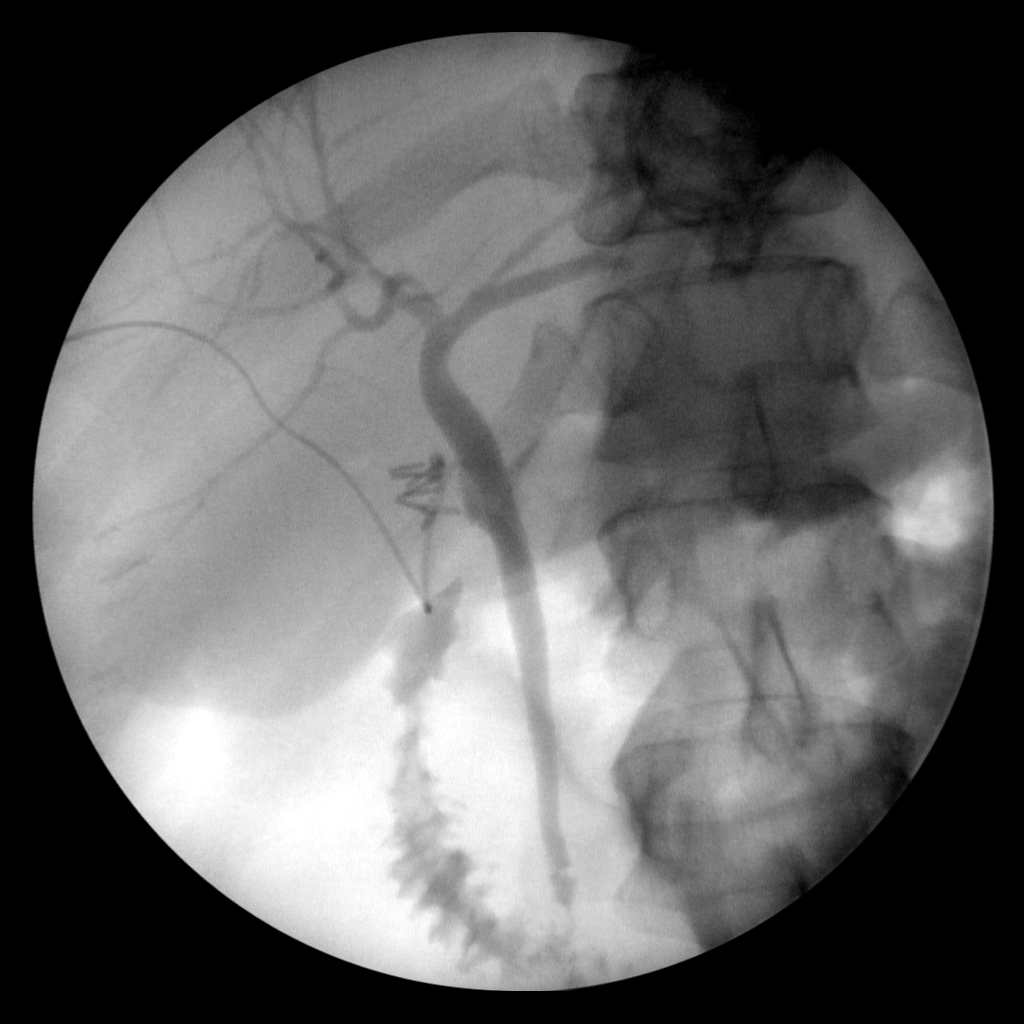
[frame 70/82]
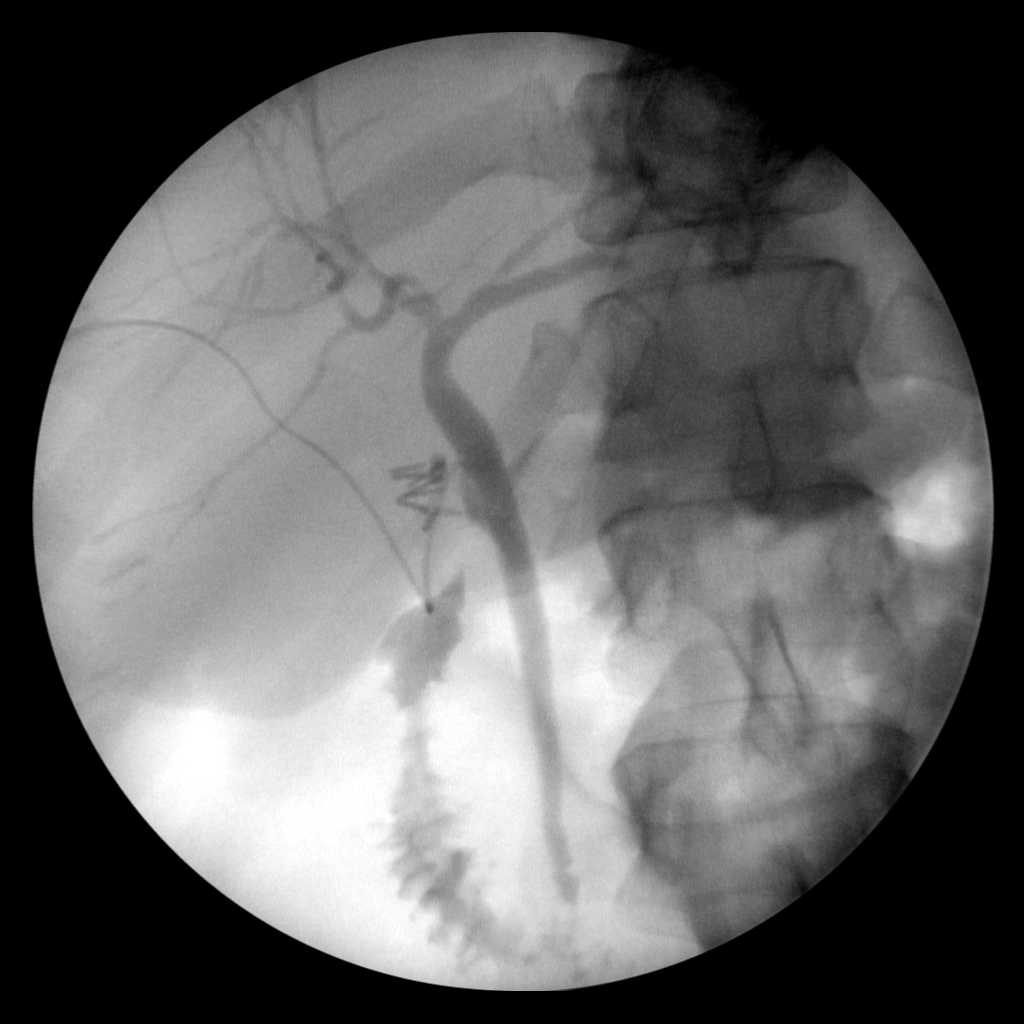

[4 of 4 positions shown; findings below may reference images not displayed]

FINDINGS: Contrast fills the biliary tree and duodenum compatible
with patency.  There is a tiny filling defect just above the
ampulla of Vater within the distal common bile duct.
IMPRESSION: Patent biliary system.  A small distal common bile duct stone is
not excluded.  See above.

## 2014-07-04 ENCOUNTER — Telehealth: Payer: Self-pay | Admitting: Family Medicine

## 2014-07-04 MED ORDER — PROMETHAZINE HCL 25 MG PO TABS: 25.0000 mg | ORAL_TABLET | Freq: Four times a day (QID) | ORAL | Status: DC | PRN

## 2014-07-04 NOTE — Addendum Note (Signed)
Addended by: FUNDERBURK, JO A on: 04/25/2015 10:51 AM   Modules accepted: Orders  

## 2014-07-04 NOTE — Telephone Encounter (Signed)
I think her gastroenterologist manages her GI issues. Ok to rx # 10 in interim. Thanks.

## 2014-07-04 NOTE — Telephone Encounter (Signed)
Rx done. 

## 2014-07-04 NOTE — Telephone Encounter (Signed)
RX refill request from pt's pharmacy for promethazine 25 mg tablets to Dr. Milinda CaveMcGowen.   This is not Dr. Milinda CaveMCGowen pt.  Please advise.

## 2014-07-09 IMAGING — CR DG CHEST 2V
2 series · 2 of 2 positions shown · non-contrast
Comparison: Chest x-ray of 10/22/2012 and

CLINICAL DATA: Laparoscopic cholecystectomy 1 week ago, chest and
back pain

CHEST - 2 VIEW

[w chest pa]
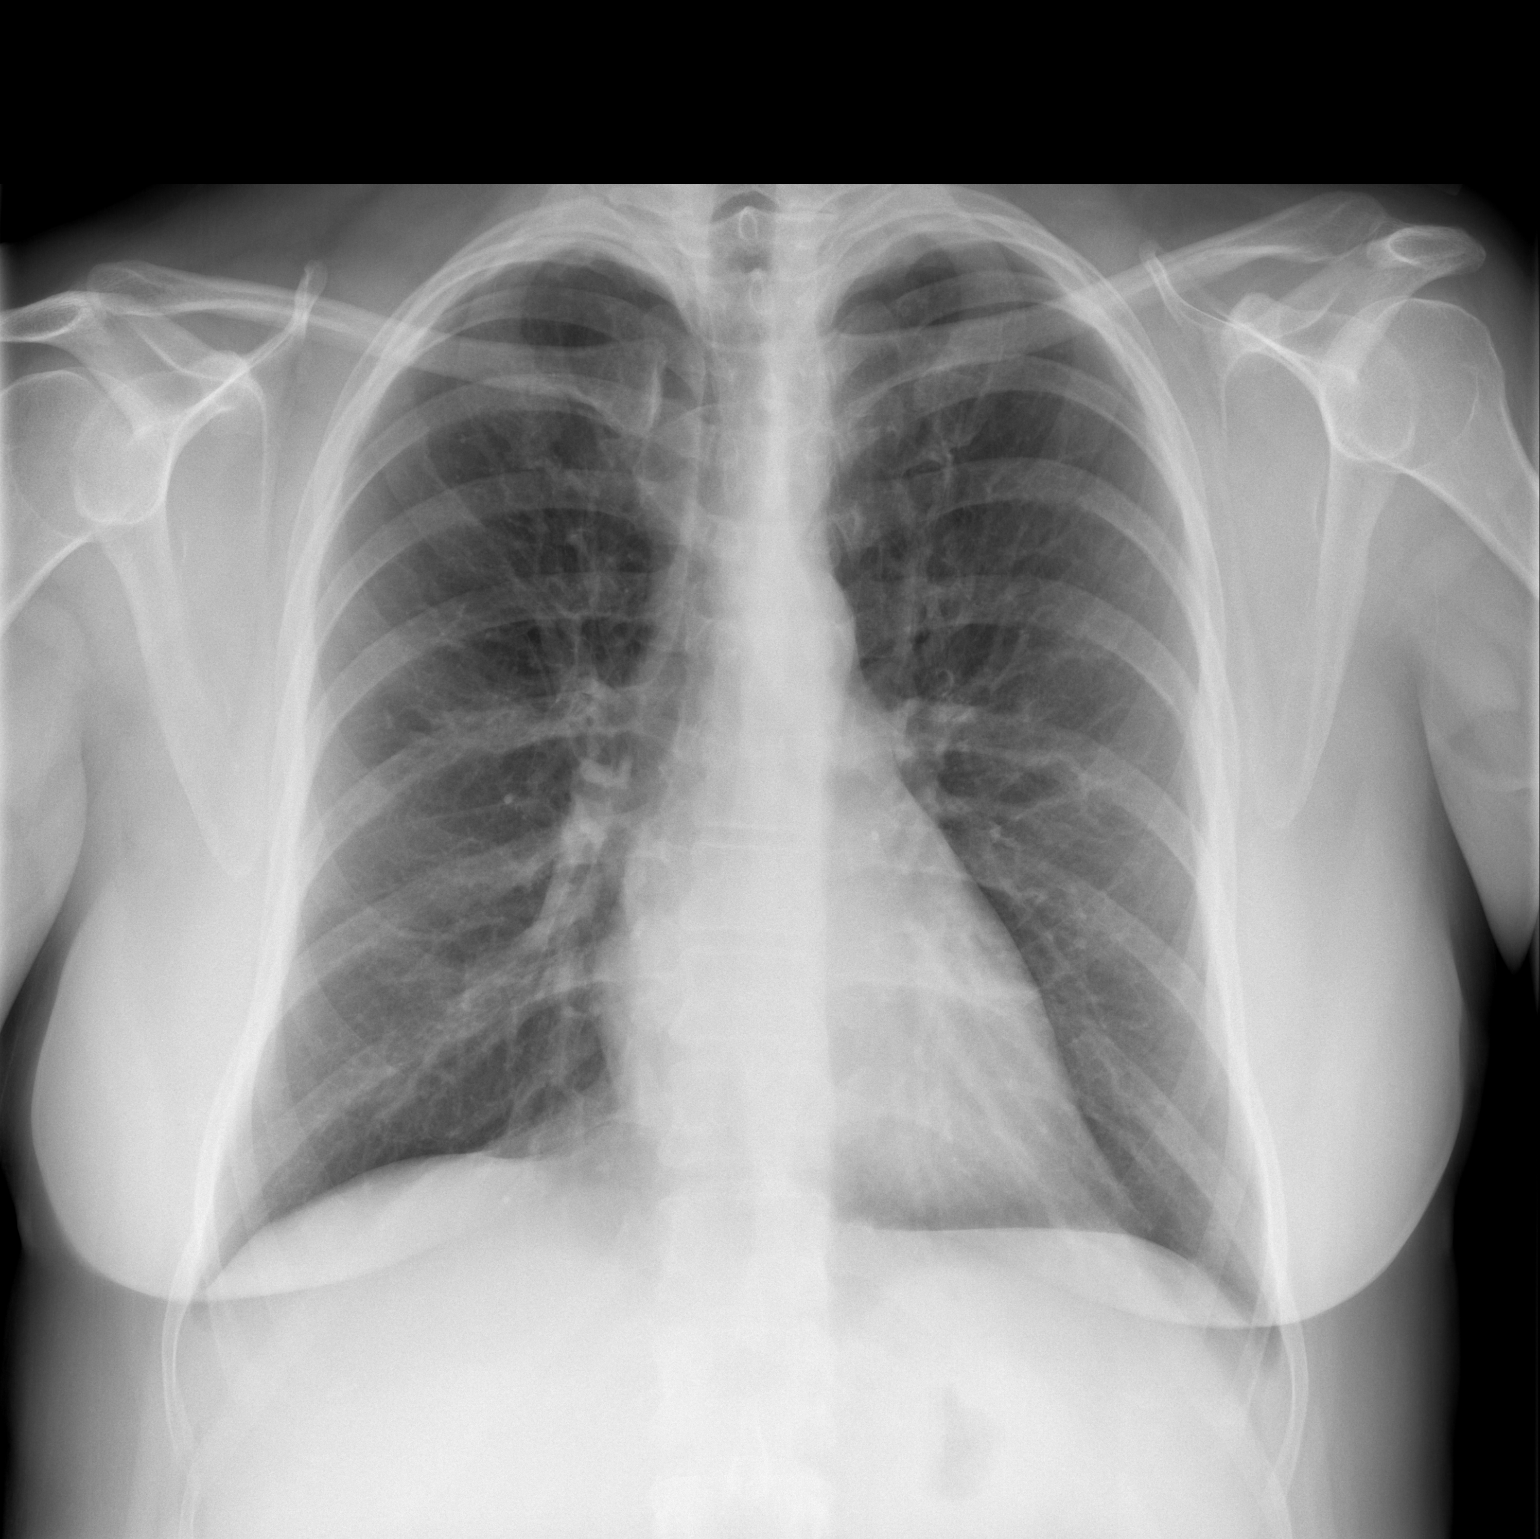

[w chest lat]
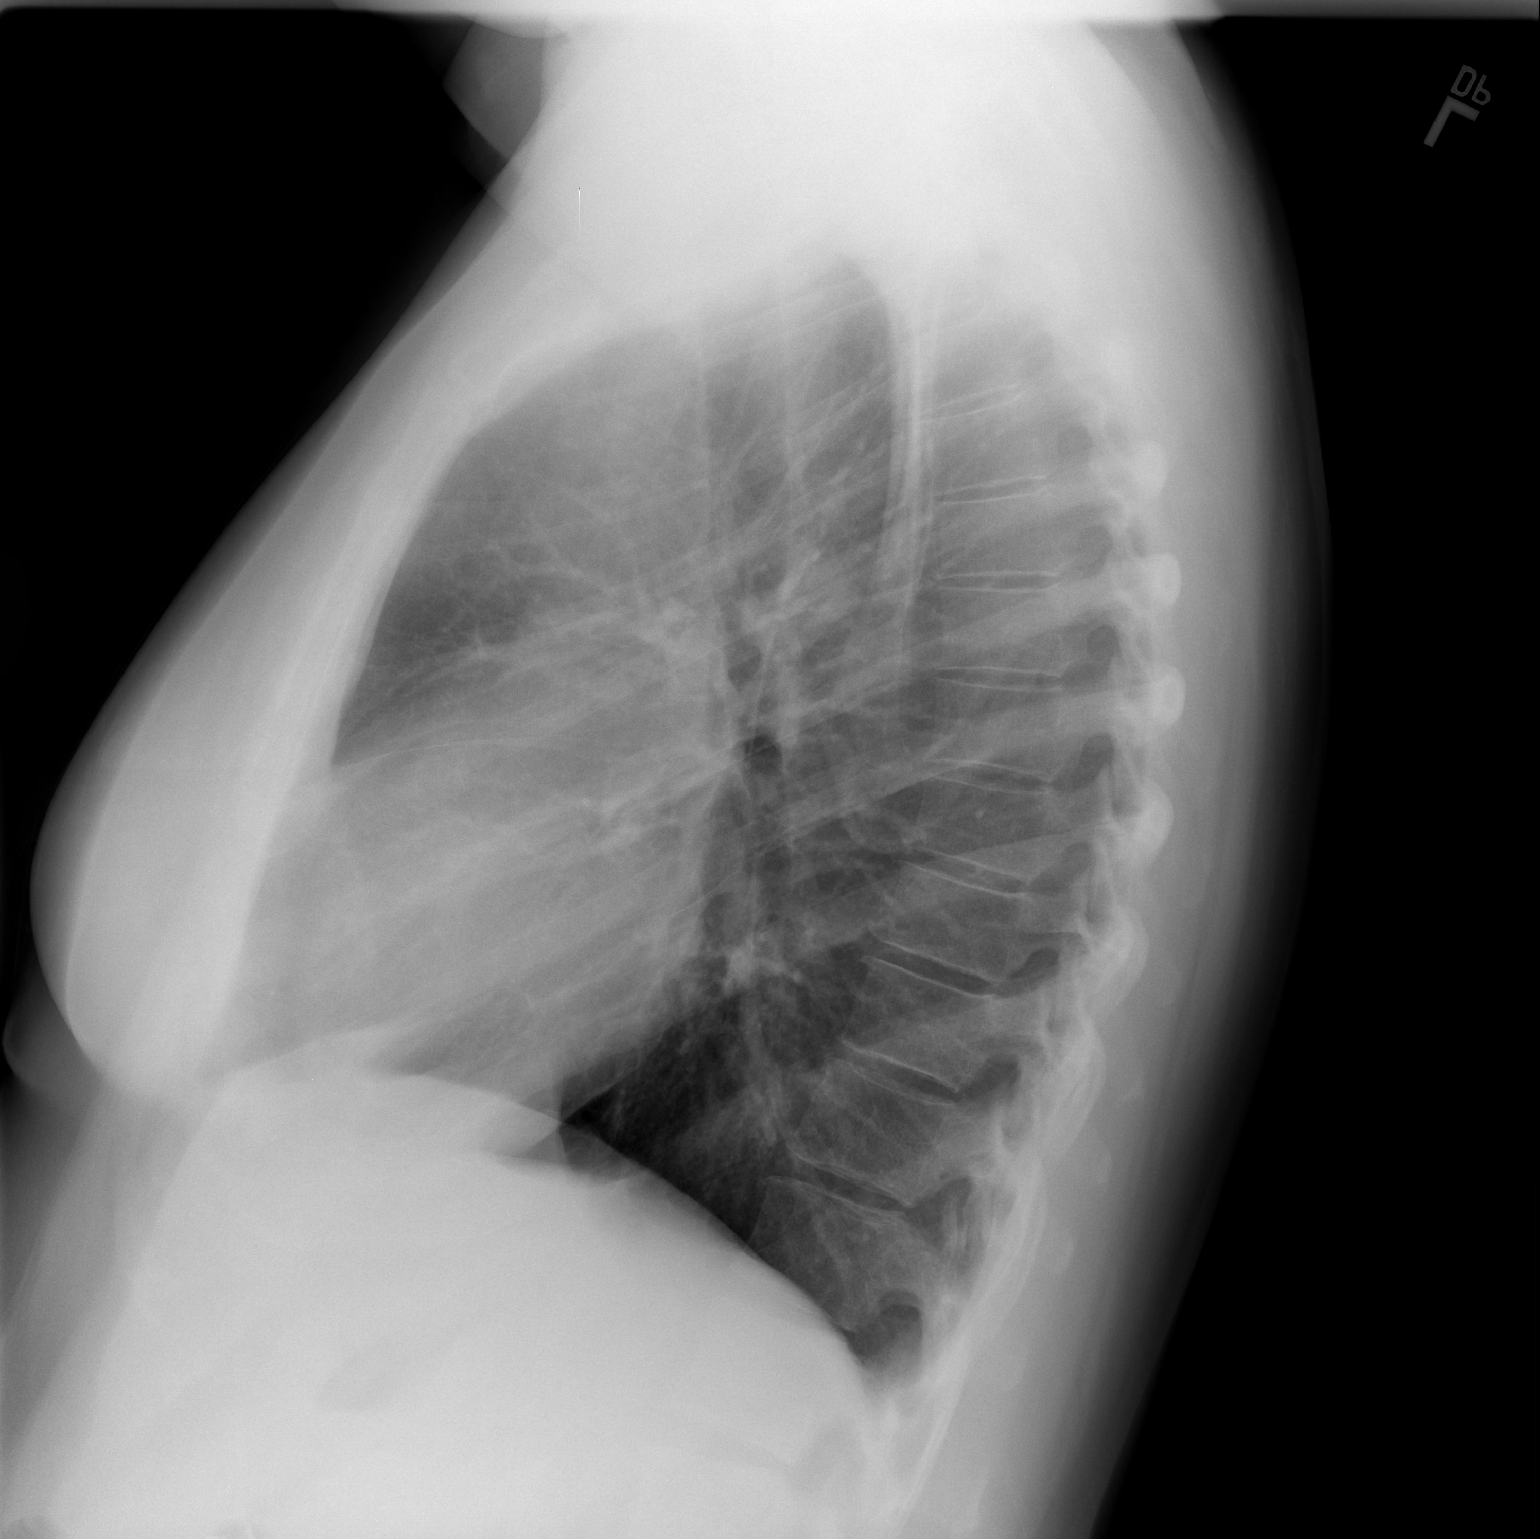

[2 of 2 positions shown; findings below may reference images not displayed]

FINDINGS: No active infiltrate or effusion is seen.  Mediastinal
contours appear normal.  Minimal peribronchial thickening is noted.
The heart is within normal limits in size.  No bony abnormality is
seen.
IMPRESSION: No active lung disease.  Minimal peribronchial thickening.

## 2014-07-09 IMAGING — CR DG ABDOMEN 1V
2 series · 2 of 2 positions shown · non-contrast
Comparison: Intraoperative cholangiogram of 10/28/2012

CLINICAL DATA: Laparoscopic cholecystectomy 1 week year, back pain
and chest pain

ABDOMEN - 1 VIEW

[t abdomen supine (1 of 2)]
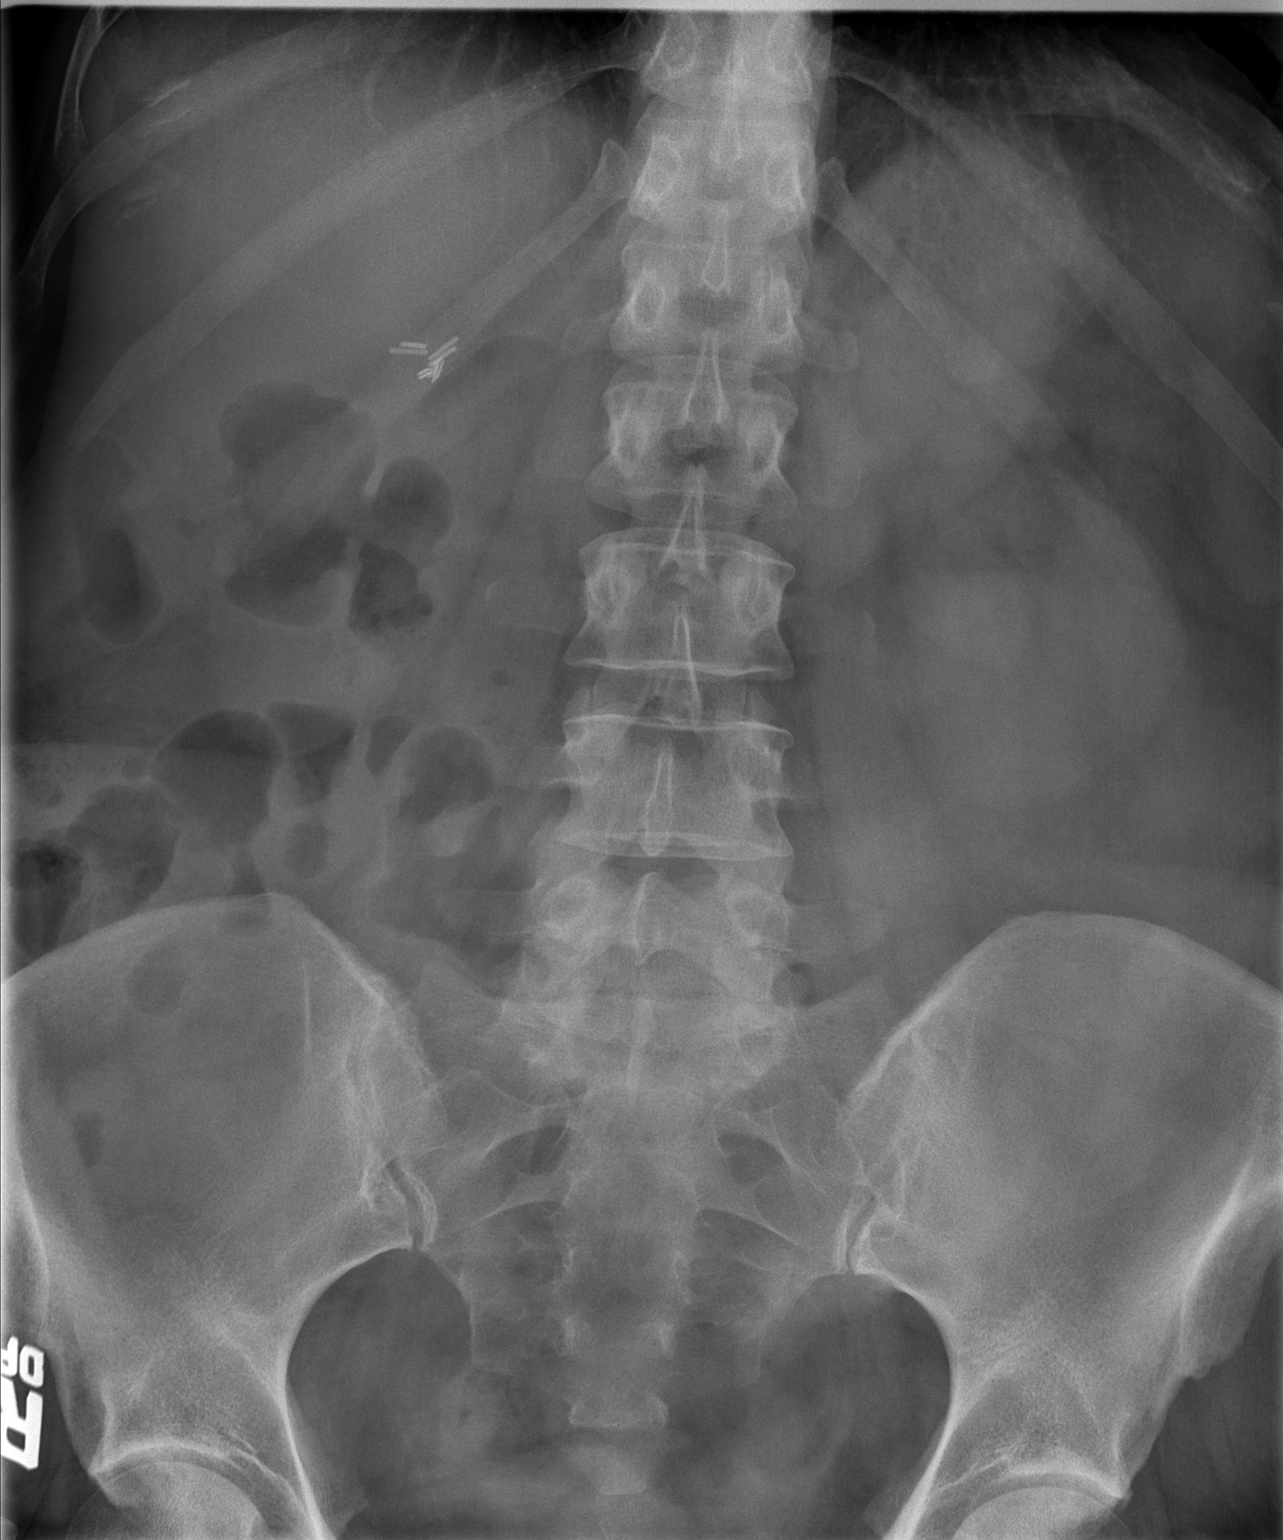

[t abdomen supine (2 of 2)]
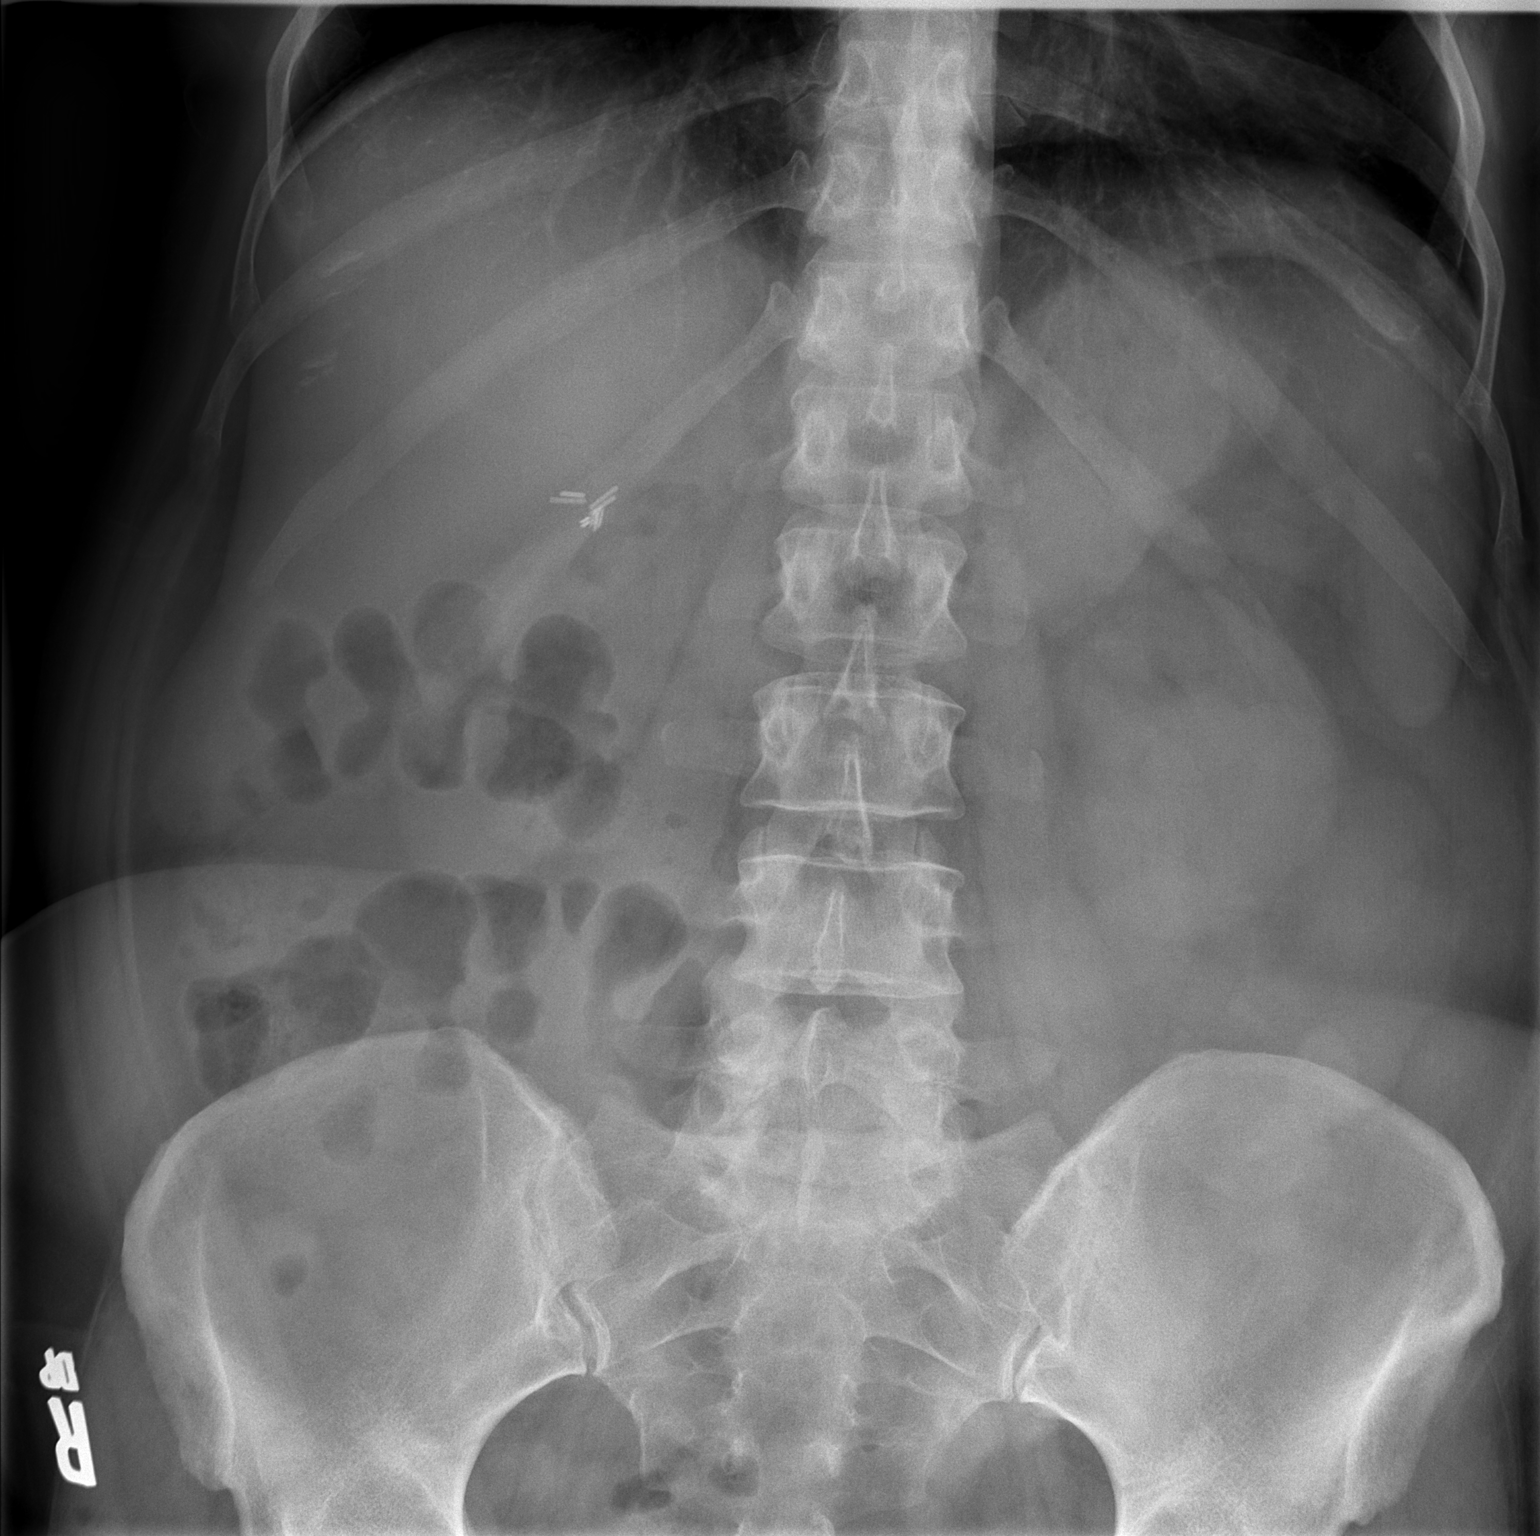

[2 of 2 positions shown; findings below may reference images not displayed]

FINDINGS: Surgical clips are noted in the right upper quadrant from
recent cholecystectomy.  The bowel gas pattern is nonspecific.  No
bowel obstruction is seen.  No opaque calculi are noted.
IMPRESSION: Nonspecific bowel gas pattern.

## 2014-07-15 ENCOUNTER — Other Ambulatory Visit: Payer: Self-pay | Admitting: Family Medicine

## 2014-07-18 NOTE — Telephone Encounter (Signed)
Rx sent to the pharmacy by e-script.//AB/CMA 

## 2014-07-21 ENCOUNTER — Ambulatory Visit (INDEPENDENT_AMBULATORY_CARE_PROVIDER_SITE_OTHER): Payer: Managed Care, Other (non HMO) | Admitting: Family Medicine

## 2014-07-21 ENCOUNTER — Other Ambulatory Visit: Payer: Self-pay | Admitting: Obstetrics & Gynecology

## 2014-07-21 ENCOUNTER — Encounter: Payer: Self-pay | Admitting: Family Medicine

## 2014-07-21 VITALS — BP 134/80 | HR 81 | Temp 97.4°F | Wt 265.0 lb

## 2014-07-21 DIAGNOSIS — J3489 Other specified disorders of nose and nasal sinuses: Secondary | ICD-10-CM

## 2014-07-21 NOTE — Patient Instructions (Signed)
Would consider leaving off Flonase and consider oral decongestant or Nasacort

## 2014-07-21 NOTE — Progress Notes (Signed)
Pre visit review using our clinic review tool, if applicable. No additional management support is needed unless otherwise documented below in the visit note. 

## 2014-07-21 NOTE — Progress Notes (Signed)
Subjective:    Patient ID: Mallory [name], female    DOB: 12/03/1965, 49 y.o.   MRN: [id number]  HPI Presents with pain bridge of the nose for about 3 days. Denies any injury. [name] not had any nosebleed. No visible swelling. No rashes. She has had some nasal congestion. [name] been using Flonase and that seemed to possibly irritate. She also has occasional ringing in both ears but no hearing changes. She took occasional phenylephrine for decongestant effect. She took some Motrin yesterday and her soreness seemed improved following that. [name] not had a loss of smell. No headaches.  No anosmia.   Past Medical History  Diagnosis Date  . Anxiety   . Depression     no counseling  . GERD (gastroesophageal reflux disease)   . Allergy     year round  . Hyperlipidemia 2002  . IBS (irritable bowel syndrome) 2008    improved over last couple of years using Align  . Interstitial cystitis 2006    Dr Marcine MatarStephen Dahlstedt  . Pulmonary nodule 3/24/20101    Incidental finding of CT Chest ordered for chest pain  . Personal history of kidney stones 1986  . Asthma     mild, intermittent, allergy triggered  . Obesity   . Fibromyalgia 1997    dx by rheumatology, no recent flares  . Hiatal hernia   . Chronic laryngitis   . Pedal edema   . Endometriosis   . Headache(784.0)   . Elevated BP   . Chronic cholecystitis with calculus     cholecystectomy 10/2012  . Arthritis     PAIN AT TIMES IN BOTH KNEES-? ARTHRITIS  . Varicose veins   . H. pylori infection   . Esophagitis   . Internal and external hemorrhoids without complication   . Anal fissure   . Pneumonia   . Urinary tract bacterial infections    Past Surgical History  Procedure Laterality Date  . Dilation and curettage of uterus  2002  . Tonsillectomy    . Polypectomy      uterine  . Cholecystectomy N/A 10/28/2012    Procedure: LAPAROSCOPIC CHOLECYSTECTOMY WITH INTRAOPERATIVE CHOLANGIOGRAM;  Surgeon: Almond LintFaera Byerly, MD;  Location: [facility];   Service: General;  Laterality: N/A;    reports that she quit smoking about 29 years ago. Her smoking use included Cigarettes. She has never used smokeless tobacco. She reports that she does not drink alcohol or use illicit drugs. family history includes Arthritis in her paternal grandmother; Breast cancer in her mother; Colon cancer in her maternal aunt and maternal grandmother; Diabetes in her mother; Heart disease in her paternal grandmother; Hyperlipidemia in her mother; Hypertension in her mother; Irritable bowel syndrome in her mother; Lung cancer in her maternal grandfather; Migraines in her sister; Ovarian cancer in her mother; Stroke (age of onset: 7971) in her father. Allergies  Allergen Reactions  . Cymbalta [Duloxetine Hcl] Nausea Only    Nausea & Fatigue  . Simvastatin     Muscle ache in legs  . Venlafaxine     REACTION: bad dreams      Review of Systems  Constitutional: Negative for fever, chills, appetite change and unexpected weight change.  HENT: Positive for congestion. Negative for ear pain, hearing loss and postnasal drip.   Neurological: Negative for headaches.       Objective:   Physical Exam  Constitutional: She appears well-developed and well-nourished.  HENT:  Right Ear: External ear normal.  Left Ear: External ear normal.  Nose: Nose normal.  Mouth/Throat: Oropharynx is clear and moist.  Nasal exam is normal. No visible swelling. No tenderness over the bridge of the nose. She does have some nasal mucosal erythema but no visible polyps. No bleeding. No purulent secretions  Cardiovascular: Normal rate and regular rhythm.   Pulmonary/Chest: Effort normal and breath sounds normal. No respiratory distress. She has no wheezes. She has no rales.          Assessment & Plan:  Nasal pain. She is describing what sounds like some soft tissue cartilage soreness but no visible swelling. She does have some frequent nasal congestive symptoms. We suggested leaving off  Flonase and consider Nasacort. She does not have any obvious intranasal lesions.

## 2014-08-08 ENCOUNTER — Other Ambulatory Visit: Payer: Self-pay | Admitting: Family Medicine

## 2014-08-08 MED ORDER — SERTRALINE HCL 50 MG PO TABS: 50.0000 mg | ORAL_TABLET | Freq: Every day | ORAL | Status: DC

## 2014-08-29 ENCOUNTER — Ambulatory Visit (INDEPENDENT_AMBULATORY_CARE_PROVIDER_SITE_OTHER): Payer: Managed Care, Other (non HMO) | Admitting: Family Medicine

## 2014-08-29 ENCOUNTER — Encounter: Payer: Self-pay | Admitting: Family Medicine

## 2014-08-29 VITALS — BP 116/80 | HR 75 | Temp 98.1°F | Ht 71.0 in | Wt 261.9 lb

## 2014-08-29 DIAGNOSIS — K589 Irritable bowel syndrome without diarrhea: Secondary | ICD-10-CM

## 2014-08-29 DIAGNOSIS — E785 Hyperlipidemia, unspecified: Secondary | ICD-10-CM

## 2014-08-29 DIAGNOSIS — J309 Allergic rhinitis, unspecified: Secondary | ICD-10-CM

## 2014-08-29 DIAGNOSIS — K2100 Gastro-esophageal reflux disease with esophagitis, without bleeding: Secondary | ICD-10-CM

## 2014-08-29 DIAGNOSIS — F418 Other specified anxiety disorders: Secondary | ICD-10-CM

## 2014-08-29 DIAGNOSIS — E669 Obesity, unspecified: Secondary | ICD-10-CM

## 2014-08-29 LAB — LIPID PANEL
CHOLESTEROL: 182 mg/dL (ref 0–200)
HDL: 39.2 mg/dL (ref >39.00)
LDL Cholesterol: 111 mg/dL — ABNORMAL HIGH (ref 0–99)
NONHDL: 142.8
TRIGLYCERIDES: 161 mg/dL — AB (ref 0.0–149.0)
Total CHOL/HDL Ratio: 5
VLDL: 32.2 mg/dL (ref 0.0–40.0)

## 2014-08-29 NOTE — Patient Instructions (Signed)
BEFORE YOU LEAVE: -labs -follow up 3-4 months  We recommend the following healthy lifestyle measures: - eat a healthy diet consisting of lots of vegetables, fruits, beans, nuts, seeds, healthy meats such as white chicken and fish and whole grains.  - avoid fried foods, fast food, processed foods, sodas, red meet and other fattening foods.  - get a least 150 minutes of aerobic exercise per week.

## 2014-08-29 NOTE — Progress Notes (Signed)
Pre visit review using our clinic review tool, if applicable. No additional management support is needed unless otherwise documented below in the visit note. 

## 2014-08-29 NOTE — Progress Notes (Signed)
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 Juanda ChanceBrodie -no blood in stools, change   ROS: See pertinent positives and negatives per HPI.  Past Medical History  Diagnosis Date  . Anxiety and depression   . GERD (gastroesophageal reflux disease)   . Allergy     year round  . Hyperlipidemia 2002  . IBS (irritable bowel syndrome) 2008    improved over last couple of years using Align  . Interstitial cystitis 2006    Dr Marcine MatarStephen Dahlstedt  . Pulmonary nodule 09/14/2008    follow up CT 2012 - no further eval advised  . Personal history of kidney stones 1986  . Asthma     mild, intermittent, allergy triggered  . Obesity   . Fibromyalgia 1997    dx by rheumatology, no recent flares  . Hiatal hernia   . Chronic laryngitis   . Pedal edema   . Endometriosis   . Headache(784.0)   . Elevated BP   . Arthritis     PAIN AT TIMES IN BOTH KNEES-? ARTHRITIS  . Varicose veins   . H. pylori infection   . Internal and external hemorrhoids without complication   . Anal fissure   . Pneumonia   . Urinary tract bacterial infections     Past Surgical History  Procedure Laterality Date  . Dilation and curettage of uterus  2002  . Tonsillectomy    . Polypectomy      uterine  . Cholecystectomy N/A 10/28/2012    Procedure: LAPAROSCOPIC CHOLECYSTECTOMY WITH INTRAOPERATIVE CHOLANGIOGRAM;  Surgeon: Almond LintFaera Byerly, MD;  Location: [facility];  Service: General;  Laterality: N/A;    Family  History  Problem Relation Age of Onset  . Diabetes Mother   . Hyperlipidemia Mother   . Hypertension Mother   . Breast cancer Mother   . Colon cancer Maternal Aunt   . Arthritis Paternal Grandmother   . Heart disease Paternal Grandmother   . Stroke Father [id number]  . Migraines Sister   . Colon cancer Maternal Grandmother   . Lung cancer Maternal Grandfather   . Ovarian cancer Mother   . Irritable bowel syndrome Mother     History   Social History  . Marital Status: Married    Spouse Name: N/A  . Number of Children: 2  . Years of Education: N/A   Occupational History  . Advertising account plannernsurance Agent    Social History Main Topics  . Smoking status: Former Smoker    Types: Cigarettes    Quit date: 06/24/1985  . Smokeless tobacco: Never Used     Comment: when a teenager  . Alcohol Use: No  . Drug Use: No  . Sexual Activity:    Partners: Male   Other Topics Concern  . None   Social History Narrative   Married, has one grown son and one grown daughter.   Has 2 grandchildren.   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     Current outpatient prescriptions:  .  acyclovir (ZOVIRAX) 400 MG tablet,  Take 1 tablet (400 mg total) by mouth 5 (five) times daily. For episodic treatment of cold sores, Disp: 20 tablet, Rfl: 3 .  albuterol (PROVENTIL HFA;VENTOLIN HFA) 108 (90 BASE) MCG/ACT inhaler, Inhale 2 puffs into the lungs every 6 (six) hours as needed for wheezing., Disp: 1 Inhaler, Rfl: 2 .  ALPRAZolam (XANAX) 0.5 MG tablet, Take 1/2-1 tablet by mouth three times daily as needed for anxiety., Disp: 45 tablet, Rfl: 1 .  atorvastatin (LIPITOR) 20 MG tablet, Take 1 tablet (20 mg total) by mouth daily., Disp: 90 tablet, Rfl: 3 .  Cholecalciferol (VITAMIN D) 2000 UNITS tablet, Take 2,000 Units by mouth every evening., Disp: , Rfl:  .  clidinium-chlordiazePOXIDE (LIBRAX) 5-2.5 MG per capsule, Take 1 capsule by mouth 3 (three) times daily before meals., Disp: 90 capsule, Rfl:  2 .  Coenzyme Q10 (COQ10 PO), Take by mouth., Disp: , Rfl:  .  fluticasone (FLONASE) 50 MCG/ACT nasal spray, USE 2 SPRAYS IN BOTH NOSTRILS DAILY, Disp: 16 g, Rfl: 2 .  hydrocortisone (ANUSOL-HC) 25 MG suppository, Place 1 suppository (25 mg total) rectally at bedtime., Disp: 12 suppository, Rfl: 0 .  levocetirizine (XYZAL) 5 MG tablet, TAKE 1 TABLET (5 MG TOTAL) BY MOUTH EVERY EVENING., Disp: 90 tablet, Rfl: 1 .  omega-3 acid ethyl esters (LOVAZA) 1 G capsule, Take 1 g by mouth at bedtime., Disp: , Rfl:  .  pantoprazole (PROTONIX) 40 MG tablet, TAKE 1 TABLET BY MOUTH TWICE DAILY (Patient taking differently: daily. TAKE 1 TABLET BY MOUTH TWICE DAILY), Disp: 180 tablet, Rfl: 1 .  Probiotic Product (ALIGN) 4 MG CAPS, Take 4 mg by mouth daily. [facility], Disp: , Rfl:  .  promethazine (PHENERGAN) 25 MG tablet, Take 1 tablet (25 mg total) by mouth every 6 (six) hours as needed for nausea., Disp: 10 tablet, Rfl: 0 .  sertraline (ZOLOFT) 50 MG tablet, Take 1 tablet (50 mg total) by mouth daily., Disp: 90 tablet, Rfl: 0 .  Zinc Sulfate (ZINC 15 PO), Take by mouth., Disp: , Rfl:   EXAM:  Filed Vitals:   08/29/14 0806  BP: 116/80  Pulse: 75  Temp: 98.1 F (36.7 C)    Body mass index is 36.54 kg/(m^2).  GENERAL: vitals reviewed and listed above, alert, oriented, appears well hydrated and in no acute distress  HEENT: atraumatic, conjunttiva clear, no obvious abnormalities on inspection of external nose and ears  NECK: no obvious masses on inspection  LUNGS: clear to auscultation bilaterally, no wheezes, rales or rhonchi, good air movement  CV: HRRR, no peripheral edema  MS: moves all extremities without noticeable abnormality  PSYCH: pleasant and cooperative, no obvious depression or anxiety  ASSESSMENT AND PLAN:  Discussed the following assessment and plan:  Hyperlipemia - Plan: Lipid Panel  Allergic rhinitis, unspecified allergic rhinitis type  Obesity  Depression  with anxiety  Gastroesophageal reflux disease with esophagitis  IBS (irritable bowel syndrome)  -FASTING lipids -lifestyle recs -cont to work to simplify med list as tolerated as lifestyle improves -Patient advised to return or notify a doctor immediately if symptoms worsen or persist or new concerns arise.  Patient Instructions  BEFORE YOU LEAVE: -labs -follow up 3-4 months  We recommend the following healthy lifestyle measures: - eat a healthy diet consisting of lots of vegetables, fruits, beans, nuts, seeds, healthy meats such as white chicken and fish and whole grains.  - avoid fried foods, fast food, processed foods, sodas, red meet and other fattening foods.  -  get a least 150 minutes of aerobic exercise per week.       Colin Benton R.

## 2014-08-30 ENCOUNTER — Other Ambulatory Visit: Payer: Self-pay | Admitting: Family Medicine

## 2014-09-29 ENCOUNTER — Telehealth: Payer: Self-pay | Admitting: Family Medicine

## 2014-09-29 MED ORDER — NAPROXEN 500 MG PO TABS: 500.0000 mg | ORAL_TABLET | Freq: Two times a day (BID) | ORAL | Status: DC | PRN

## 2014-09-29 MED ORDER — CYCLOBENZAPRINE HCL 5 MG PO TABS: 5.0000 mg | ORAL_TABLET | Freq: Two times a day (BID) | ORAL | Status: DC | PRN

## 2014-09-29 NOTE — Telephone Encounter (Signed)
Patient informed. 

## 2014-09-29 NOTE — Telephone Encounter (Signed)
Please call pt: Sent muscle relaxer to pharmacy. Advise to use at night before bed. Do not use with the ativan if possible. Also sent naproxen to pharmacy. Follow up appt advised if persists or other symptoms.

## 2014-09-29 NOTE — Telephone Encounter (Signed)
Pt called to say that the was diagnosed 20 years ago with FIBROMYALGIA and that it only flares up when she is stress. She said she is having a flare up and is asking if Dr Selena BattenKim can rx her something for the pain ..Marland Kitchen

## 2014-10-07 ENCOUNTER — Other Ambulatory Visit: Payer: Self-pay | Admitting: Obstetrics & Gynecology

## 2014-10-10 LAB — CYTOLOGY - PAP

## 2014-11-06 ENCOUNTER — Other Ambulatory Visit: Payer: Self-pay | Admitting: Family Medicine

## 2014-11-07 ENCOUNTER — Other Ambulatory Visit: Payer: Self-pay | Admitting: Family Medicine

## 2014-12-29 ENCOUNTER — Ambulatory Visit: Payer: Managed Care, Other (non HMO) | Admitting: Family Medicine

## 2015-01-25 ENCOUNTER — Other Ambulatory Visit: Payer: Self-pay | Admitting: Family Medicine

## 2015-01-31 ENCOUNTER — Ambulatory Visit (INDEPENDENT_AMBULATORY_CARE_PROVIDER_SITE_OTHER): Payer: Managed Care, Other (non HMO) | Admitting: Family Medicine

## 2015-01-31 ENCOUNTER — Encounter: Payer: Self-pay | Admitting: Family Medicine

## 2015-01-31 VITALS — BP 116/78 | HR 76 | Temp 97.6°F | Ht 71.0 in | Wt 272.0 lb

## 2015-01-31 DIAGNOSIS — K219 Gastro-esophageal reflux disease without esophagitis: Secondary | ICD-10-CM | POA: Diagnosis not present

## 2015-01-31 DIAGNOSIS — R1314 Dysphagia, pharyngoesophageal phase: Secondary | ICD-10-CM | POA: Diagnosis not present

## 2015-01-31 DIAGNOSIS — F418 Other specified anxiety disorders: Secondary | ICD-10-CM

## 2015-01-31 DIAGNOSIS — J4520 Mild intermittent asthma, uncomplicated: Secondary | ICD-10-CM | POA: Diagnosis not present

## 2015-01-31 MED ORDER — SERTRALINE HCL 50 MG PO TABS: 50.0000 mg | ORAL_TABLET | Freq: Every day | ORAL | Status: DC

## 2015-01-31 MED ORDER — ALBUTEROL SULFATE HFA 108 (90 BASE) MCG/ACT IN AERS: 2.0000 | INHALATION_SPRAY | Freq: Four times a day (QID) | RESPIRATORY_TRACT | Status: AC | PRN

## 2015-01-31 NOTE — Progress Notes (Signed)
Pre visit review using our clinic review tool, if applicable. No additional management support is needed unless otherwise documented below in the visit note. 

## 2015-01-31 NOTE — Patient Instructions (Signed)
Call today for appointment with your gastroenterologist regarding the pain with swallowing - please take medications as advised by your gastroenterologist regarding your reflux  Set up counseling appointment. Let me know when you want to wean off of the zoloft  We recommend the following healthy lifestyle measures: - eat a healthy diet consisting of lots of vegetables, fruits, beans, nuts, seeds, healthy meats such as white chicken and fish and whole grains.  - avoid fried foods, fast food, processed foods, sodas, red meet and other fattening foods.  - get a least 150 minutes of aerobic exercise per week.

## 2015-01-31 NOTE — Progress Notes (Signed)
HPI:  Mallory [name] is a 49 yo F w/ a PMH sig for Anxiety, depression, GERD, hx  Esophagitis and duodenitis, Asthma, Fibromyalgia, HLD, Hiatal hernia, Obesity, hypothyroidism and IBS here for an acute visit for:  Heartburn/dyshagia: -had been doing better and cut down on protonix from bid to qd dosing the last few months -for the last 1-2 weeks notices substernal pain/discomfort while eating only and some heartburn/epigastic pain after meals - does not occur with activity, milder symptoms with liquids only - but is worse with solid food -denies:choking, nausea, vomiting, fevers, change in bowels, wheezing, weight loss, melena -not taking nsaids, but uses asa occ when on periods -note: on many medications for her IBS and GERD and sees Dr. Juanda Chance, on review of chart hx of esophagitis and duodenitis in the past on EGD 2004  Mood disorder: -wants to wean off zoloft due to weight concerns and feels no longer has issues with anxiety and depression -does not use xanax as feels not needed -not doing counseling -denies depression, anxiety, panic attacks  Mild asthma: Needs refill on alb, no symptoms  ROS: See pertinent positives and negatives per HPI.  Past Medical History  Diagnosis Date  . Anxiety and depression   . GERD (gastroesophageal reflux disease)   . Allergy     year round  . Hyperlipidemia 2002  . IBS (irritable bowel syndrome) 2008    improved over last couple of years using Align  . Interstitial cystitis 2006    Dr Marcine Matar  . Pulmonary nodule 09/14/2008    follow up CT 2012 - no further eval advised  . Personal history of kidney stones 1986  . Asthma     mild, intermittent, allergy triggered  . Obesity   . Fibromyalgia 1997    dx by rheumatology, no recent flares  . Hiatal hernia   . Chronic laryngitis   . Pedal edema   . Endometriosis   . Headache(784.0)   . Elevated BP   . Arthritis     PAIN AT TIMES IN BOTH KNEES-? ARTHRITIS  . Varicose veins   .  H. pylori infection   . Internal and external hemorrhoids without complication   . Anal fissure   . Pneumonia   . Urinary tract bacterial infections     Past Surgical History  Procedure Laterality Date  . Dilation and curettage of uterus  2002  . Tonsillectomy    . Polypectomy      uterine  . Cholecystectomy N/A 10/28/2012    Procedure: LAPAROSCOPIC CHOLECYSTECTOMY WITH INTRAOPERATIVE CHOLANGIOGRAM;  Surgeon: Almond Lint, MD;  Location: [facility];  Service: General;  Laterality: N/A;    Family History  Problem Relation Age of Onset  . Diabetes Mother   . Hyperlipidemia Mother   . Hypertension Mother   . Breast cancer Mother   . Colon cancer Maternal Aunt   . Arthritis Paternal Grandmother   . Heart disease Paternal Grandmother   . Stroke Father 80  . Migraines Sister   . Colon cancer Maternal Grandmother   . Lung cancer Maternal Grandfather   . Ovarian cancer Mother   . Irritable bowel syndrome Mother     History   Social History  . Marital Status: Married    Spouse Name: N/A  . Number of Children: 2  . Years of Education: N/A   Occupational History  . Advertising account planner    Social History Main Topics  . Smoking status: Former Smoker    Types: Cigarettes  Quit date: 06/24/1985  . Smokeless tobacco: Never Used     Comment: when a teenager  . Alcohol Use: No  . Drug Use: No  . Sexual Activity:    Partners: Male   Other Topics Concern  . None   Social History Narrative   Married, has one grown son and one grown daughter.   Has 2 grandchildren.   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     Current outpatient prescriptions:  .  acyclovir (ZOVIRAX) 400 MG tablet, Take 1 tablet (400 mg total) by mouth 5 (five) times daily. For episodic treatment of cold sores, Disp: 20 tablet, Rfl: 3 .  albuterol (PROVENTIL HFA;VENTOLIN HFA) 108 (90 BASE) MCG/ACT inhaler, Inhale 2 puffs into the lungs every 6 (six) hours as needed for wheezing., Disp: 1  Inhaler, Rfl: 2 .  ALPRAZolam (XANAX) 0.5 MG tablet, Take 1/2-1 tablet by mouth three times daily as needed for anxiety., Disp: 45 tablet, Rfl: 1 .  atorvastatin (LIPITOR) 20 MG tablet, Take 1 tablet (20 mg total) by mouth daily., Disp: 90 tablet, Rfl: 3 .  Cholecalciferol (VITAMIN D) 2000 UNITS tablet, Take 2,000 Units by mouth every evening., Disp: , Rfl:  .  clidinium-chlordiazePOXIDE (LIBRAX) 5-2.5 MG per capsule, Take 1 capsule by mouth 3 (three) times daily before meals., Disp: 90 capsule, Rfl: 2 .  Coenzyme Q10 (COQ10 PO), Take by mouth., Disp: , Rfl:  .  cyclobenzaprine (FLEXERIL) 5 MG tablet, Take 1 tablet (5 mg total) by mouth 2 (two) times daily as needed for muscle spasms., Disp: 20 tablet, Rfl: 0 .  fluticasone (FLONASE) 50 MCG/ACT nasal spray, 2 sprays in each nostril daily, Disp: 48 g, Rfl: 0 .  hydrocortisone (ANUSOL-HC) 25 MG suppository, Place 1 suppository (25 mg total) rectally at bedtime., Disp: 12 suppository, Rfl: 0 .  levocetirizine (XYZAL) 5 MG tablet, TAKE 1 TABLET (5 MG TOTAL) BY MOUTH EVERY EVENING., Disp: 90 tablet, Rfl: 1 .  Omega-3 Fatty Acids (FISH OIL PO), Take by mouth., Disp: , Rfl:  .  pantoprazole (PROTONIX) 40 MG tablet, TAKE 1 TABLET BY MOUTH TWICE DAILY (Patient taking differently: daily. TAKE 1 TABLET BY MOUTH TWICE DAILY), Disp: 180 tablet, Rfl: 1 .  Probiotic Product (PROBIOTIC DAILY PO), Take by mouth., Disp: , Rfl:  .  promethazine (PHENERGAN) 25 MG tablet, Take 1 tablet (25 mg total) by mouth every 6 (six) hours as needed for nausea., Disp: 10 tablet, Rfl: 0 .  sertraline (ZOLOFT) 50 MG tablet, Take 1 tablet (50 mg total) by mouth daily., Disp: 30 tablet, Rfl: 0 .  Zinc Sulfate (ZINC 15 PO), Take by mouth., Disp: , Rfl:   EXAM:  Filed Vitals:   01/31/15 0912  BP: 116/78  Pulse: 76  Temp: 97.6 F (36.4 C)    Body mass index is 37.95 kg/(m^2).  GENERAL: vitals reviewed and listed above, alert, oriented, appears well hydrated and in no acute  distress  HEENT: atraumatic, conjunttiva clear, no obvious abnormalities on inspection of external nose and ears  NECK: no obvious masses on inspection  LUNGS: clear to auscultation bilaterally, no wheezes, rales or rhonchi, good air movement  CV: HRRR, no peripheral edema  ABD: BS+, soft, NTTP  MS: moves all extremities without noticeable abnormality  PSYCH: pleasant and cooperative, no obvious depression or anxiety  ASSESSMENT AND PLAN:  Discussed the following assessment and plan:  Depression with anxiety - Plan: sertraline (ZOLOFT) 50 MG tablet -discussed options, not taking xanax  and wants to wean off zoloft as not currently having symptoms and has weight concerns -she opted to start SBT, then wean of zoloft -she plans to email me when ready to wean and ill send in lower dose, or if need refills  Gastroesophageal reflux disease, esophagitis presence not specified Dysphagia, pharyngoesophageal phase -needs eval with GI and likely EGD, advised to restart bid PPI given hx and call her GI doc about her symptoms and for follow up  Mild intermittent asthma, uncomplicated -refilled albuterol  -Patient advised to return or notify a doctor immediately if symptoms worsen or persist or new concerns arise.  Patient Instructions  Call today for appointment with your gastroenterologist regarding the pain with swallowing - please take medications as advised by your gastroenterologist regarding your reflux  Set up counseling appointment. Let me know when you want to wean off of the zoloft  We recommend the following healthy lifestyle measures: - eat a healthy diet consisting of lots of vegetables, fruits, beans, nuts, seeds, healthy meats such as white chicken and fish and whole grains.  - avoid fried foods, fast food, processed foods, sodas, red meet and other fattening foods.  - get a least 150 minutes of aerobic exercise per week.       Kriste Basque R.

## 2015-02-08 ENCOUNTER — Ambulatory Visit: Payer: Managed Care, Other (non HMO) | Admitting: Internal Medicine

## 2015-02-13 ENCOUNTER — Other Ambulatory Visit: Payer: Self-pay | Admitting: Internal Medicine

## 2015-02-15 ENCOUNTER — Encounter: Payer: Self-pay | Admitting: Internal Medicine

## 2015-02-15 ENCOUNTER — Ambulatory Visit (INDEPENDENT_AMBULATORY_CARE_PROVIDER_SITE_OTHER): Payer: Managed Care, Other (non HMO) | Admitting: Internal Medicine

## 2015-02-15 DIAGNOSIS — K2100 Gastro-esophageal reflux disease with esophagitis, without bleeding: Secondary | ICD-10-CM

## 2015-02-15 NOTE — Progress Notes (Signed)
Mallory [name] 15-Feb-1966 [id number]  Note: This dictation was prepared with Dragon digital system. Any transcriptional errors that result from this procedure are unintentional.   History of Present Illness: This is a 49 yo WF with hx of GERD, now with recurrent SSCP after she reduced Protonix from 40 mg bid, to 40 mg po qd. She increased the Protonix up to 40 mg po bid within last 3 days and has already noticed improvement. She had established esophagitis on EGD in 2004. UGI series in 09/2005 revealed moderate reflux to the level of cervical esophagus.    Past Medical History  Diagnosis Date  . Anxiety and depression   . GERD (gastroesophageal reflux disease)   . Allergy     year round  . Hyperlipidemia 2002  . IBS (irritable bowel syndrome) 2008    improved over last couple of years using Align  . Interstitial cystitis 2006    Dr Marcine Matar  . Pulmonary nodule 09/14/2008    follow up CT 2012 - no further eval advised  . Personal history of kidney stones 1986  . Asthma     mild, intermittent, allergy triggered  . Obesity   . Fibromyalgia 1997    dx by rheumatology, no recent flares  . Hiatal hernia   . Chronic laryngitis   . Pedal edema   . Endometriosis   . Headache(784.0)   . Elevated BP   . Arthritis     PAIN AT TIMES IN BOTH KNEES-? ARTHRITIS  . Varicose veins   . H. pylori infection   . Internal and external hemorrhoids without complication   . Anal fissure   . Pneumonia   . Urinary tract bacterial infections     Past Surgical History  Procedure Laterality Date  . Dilation and curettage of uterus  2002  . Tonsillectomy    . Polypectomy      uterine  . Cholecystectomy N/A 10/28/2012    Procedure: LAPAROSCOPIC CHOLECYSTECTOMY WITH INTRAOPERATIVE CHOLANGIOGRAM;  Surgeon: Almond Lint, MD;  Location: [facility];  Service: General;  Laterality: N/A;    Allergies  Allergen Reactions  . Cymbalta [Duloxetine Hcl] Nausea Only    Nausea & Fatigue  . Simvastatin      Muscle ache in legs  . Venlafaxine     REACTION: bad dreams    Family history and social history have been reviewed.  Review of Systems: heartburn, occasional dysphagia  The remainder of the 10 point ROS is negative except as outlined in the H&P  Physical Exam: General Appearance Well developed, in no distress Eyes  Non icteric  HEENT  Non traumatic, normocephalic  Mouth No lesion, tongue papillated, no cheilosis Neck Supple without adenopathy, thyroid not enlarged, no carotid bruits, no JVD Lungs Clear to auscultation bilaterally COR Normal S1, normal S2, regular rhythm, no murmur, quiet precordium Abdomen no tenderness, Rectal not done Extremities  No pedal edema Skin No lesions Neurological Alert and oriented x 3 Psychological Normal mood and affect  Assessment and Plan:   49 yo WF with established GERD, breakthrough symptoms when she reduced the PPI dose. She is concerned about the long term effects of PPI's. She will continue Protonix 40 mg in am and will start Ranitidine  hs. Gaviscon prn.    Lina Sar 02/15/2015

## 2015-02-15 NOTE — Patient Instructions (Signed)
Dr Kriste Basque DO

## 2015-02-16 ENCOUNTER — Other Ambulatory Visit: Payer: Self-pay | Admitting: Family Medicine

## 2015-02-16 MED ORDER — PANTOPRAZOLE SODIUM 40 MG PO TBEC: 40.0000 mg | DELAYED_RELEASE_TABLET | Freq: Every day | ORAL | Status: DC

## 2015-02-16 MED ORDER — RANITIDINE HCL 300 MG PO TABS: 300.0000 mg | ORAL_TABLET | Freq: Every day | ORAL | Status: DC

## 2015-02-16 NOTE — Addendum Note (Signed)
Addended by: Jeanine Luz on: 02/16/2015 09:10 AM   Modules accepted: Orders

## 2015-03-02 ENCOUNTER — Other Ambulatory Visit: Payer: Self-pay | Admitting: Family Medicine

## 2015-03-06 ENCOUNTER — Ambulatory Visit: Payer: Self-pay | Admitting: Family Medicine

## 2015-03-06 NOTE — Progress Notes (Signed)
No show

## 2015-03-07 ENCOUNTER — Ambulatory Visit (INDEPENDENT_AMBULATORY_CARE_PROVIDER_SITE_OTHER): Payer: Managed Care, Other (non HMO) | Admitting: Family Medicine

## 2015-03-07 ENCOUNTER — Encounter: Payer: Self-pay | Admitting: Family Medicine

## 2015-03-07 VITALS — BP 132/72 | HR 88 | Temp 98.0°F | Ht 71.0 in | Wt 269.8 lb

## 2015-03-07 DIAGNOSIS — D72829 Elevated white blood cell count, unspecified: Secondary | ICD-10-CM

## 2015-03-07 DIAGNOSIS — N946 Dysmenorrhea, unspecified: Secondary | ICD-10-CM | POA: Diagnosis not present

## 2015-03-07 DIAGNOSIS — G5601 Carpal tunnel syndrome, right upper limb: Secondary | ICD-10-CM | POA: Diagnosis not present

## 2015-03-07 DIAGNOSIS — Z23 Encounter for immunization: Secondary | ICD-10-CM

## 2015-03-07 DIAGNOSIS — B372 Candidiasis of skin and nail: Secondary | ICD-10-CM | POA: Diagnosis not present

## 2015-03-07 MED ORDER — FLUCONAZOLE 150 MG PO TABS: 150.0000 mg | ORAL_TABLET | Freq: Once | ORAL | Status: DC

## 2015-03-07 NOTE — Progress Notes (Signed)
HPI:  Mallory [name] is a 49 yo F w/ a PMH sig for Anxiety, depression, GERD, hx Esophagitis and duodenitis, Asthma, Fibromyalgia, HLD, Hiatal hernia, Obesity, hypothyroidism and IBS here for an acute visit for:  Pain in thumb and index fingers R: -lots of typing, throbbing pain at times -for several months -denies weakness, numbness, malaise, fevers, swelling of joints  Rash in Groin region: -intermittent this summer -itchy and burning at times -resolves then recurs -denies bleeding, vaginal discharge, rash elsewhere  "Brain Fog" -started: 2 months ago, only occurs with periods then resolved -on continuous OCPs for years, then stopped three months ago and has restarted periods the last few months -symptoms: mild dizziness or brain fog continuous with periods, then resolves -denies: heavy menstrual bleeding, depression, falls, vertigo  ROS: See pertinent positives and negatives per HPI.  Past Medical History  Diagnosis Date  . Anxiety and depression   . GERD (gastroesophageal reflux disease)   . Allergy     year round  . Hyperlipidemia 2002  . IBS (irritable bowel syndrome) 2008    improved over last couple of years using Align  . Interstitial cystitis 2006    Dr Marcine Matar  . Pulmonary nodule 09/14/2008    follow up CT 2012 - no further eval advised  . Personal history of kidney stones 1986  . Asthma     mild, intermittent, allergy triggered  . Obesity   . Fibromyalgia 1997    dx by rheumatology, no recent flares  . Hiatal hernia   . Chronic laryngitis   . Pedal edema   . Endometriosis   . Headache(784.0)   . Elevated BP   . Arthritis     PAIN AT TIMES IN BOTH KNEES-? ARTHRITIS  . Varicose veins   . H. pylori infection   . Internal and external hemorrhoids without complication   . Anal fissure   . Pneumonia   . Urinary tract bacterial infections     Past Surgical History  Procedure Laterality Date  . Dilation and curettage of uterus  2002  .  Tonsillectomy    . Polypectomy      uterine  . Cholecystectomy N/A 10/28/2012    Procedure: LAPAROSCOPIC CHOLECYSTECTOMY WITH INTRAOPERATIVE CHOLANGIOGRAM;  Surgeon: Almond Lint, MD;  Location: [facility];  Service: General;  Laterality: N/A;    Family History  Problem Relation Age of Onset  . Diabetes Mother   . Hyperlipidemia Mother   . Hypertension Mother   . Breast cancer Mother   . Colon cancer Maternal Aunt   . Arthritis Paternal Grandmother   . Heart disease Paternal Grandmother   . Stroke Father 67  . Migraines Sister   . Colon cancer Maternal Grandmother   . Lung cancer Maternal Grandfather   . Ovarian cancer Mother   . Irritable bowel syndrome Mother     Social History   Social History  . Marital Status: Married    Spouse Name: N/A  . Number of Children: 2  . Years of Education: N/A   Occupational History  . Advertising account planner    Social History Main Topics  . Smoking status: Former Smoker    Types: Cigarettes    Quit date: 06/24/1985  . Smokeless tobacco: Never Used     Comment: when a teenager  . Alcohol Use: No  . Drug Use: No  . Sexual Activity:    Partners: Male   Other Topics Concern  . None   Social History Narrative  Married, has one grown son and one grown daughter.   Has 2 grandchildren.   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     Current outpatient prescriptions:  .  acyclovir (ZOVIRAX) 400 MG tablet, Take 1 tablet (400 mg total) by mouth 5 (five) times daily. For episodic treatment of cold sores, Disp: 20 tablet, Rfl: 3 .  albuterol (PROVENTIL HFA;VENTOLIN HFA) 108 (90 BASE) MCG/ACT inhaler, Inhale 2 puffs into the lungs every 6 (six) hours as needed for wheezing., Disp: 1 Inhaler, Rfl: 2 .  ALPRAZolam (XANAX) 0.5 MG tablet, Take 1/2-1 tablet by mouth three times daily as needed for anxiety., Disp: 45 tablet, Rfl: 1 .  atorvastatin (LIPITOR) 20 MG tablet, TAKE 1 TABLET BY MOUTH DAILY, Disp: 90 tablet, Rfl: 1 .   Cholecalciferol (VITAMIN D) 2000 UNITS tablet, Take 2,000 Units by mouth every evening., Disp: , Rfl:  .  clidinium-chlordiazePOXIDE (LIBRAX) 5-2.5 MG per capsule, Take 1 capsule by mouth 3 (three) times daily before meals., Disp: 90 capsule, Rfl: 2 .  Coenzyme Q10 (COQ10 PO), Take by mouth., Disp: , Rfl:  .  cyclobenzaprine (FLEXERIL) 5 MG tablet, Take 1 tablet (5 mg total) by mouth 2 (two) times daily as needed for muscle spasms., Disp: 20 tablet, Rfl: 0 .  fluticasone (FLONASE) 50 MCG/ACT nasal spray, 2 sprays in each nostril daily, Disp: 48 g, Rfl: 0 .  hydrocortisone (ANUSOL-HC) 25 MG suppository, Place 1 suppository (25 mg total) rectally at bedtime., Disp: 12 suppository, Rfl: 0 .  levocetirizine (XYZAL) 5 MG tablet, TAKE 1 TABLET BY MOUTH EVERY EVENING, Disp: 90 tablet, Rfl: 1 .  Omega-3 Fatty Acids (FISH OIL PO), Take by mouth., Disp: , Rfl:  .  pantoprazole (PROTONIX) 40 MG tablet, Take 1 tablet (40 mg total) by mouth daily., Disp: 90 tablet, Rfl: 3 .  Probiotic Product (PROBIOTIC DAILY PO), Take by mouth., Disp: , Rfl:  .  promethazine (PHENERGAN) 25 MG tablet, Take 1 tablet (25 mg total) by mouth every 6 (six) hours as needed for nausea., Disp: 10 tablet, Rfl: 0 .  ranitidine (ZANTAC) 300 MG tablet, Take 1 tablet (300 mg total) by mouth at bedtime., Disp: 90 tablet, Rfl: 3 .  sertraline (ZOLOFT) 50 MG tablet, TAKE 1 TABLET BY MOUTH EVERY DAY, Disp: 30 tablet, Rfl: 0 .  Zinc Sulfate (ZINC 15 PO), Take by mouth., Disp: , Rfl:  .  fluconazole (DIFLUCAN) 150 MG tablet, Take 1 tablet (150 mg total) by mouth once., Disp: 3 tablet, Rfl: 0  EXAM:  Filed Vitals:   03/07/15 1552  BP: 132/72  Pulse: 88  Temp: 98 F (36.7 C)    Body mass index is 37.65 kg/(m^2).  GENERAL: vitals reviewed and listed above, alert, oriented, appears well hydrated and in no acute distress  HEENT: atraumatic, conjunttiva clear, no obvious abnormalities on inspection of external nose and ears  NECK: no  obvious masses on inspection  LUNGS: clear to auscultation bilaterally, no wheezes, rales or rhonchi, good air movement  CV: HRRR, no peripheral edema  SKIN: erythematous mildly macerated skin R inguinal region with a few satellite lesions and milder finding L inguinal region  MS: moves all extremities without noticeable abnormality. + Phalens R, neg tinels, no swelling or erythema of hands or wrists, normal strength and sennsation, normal radial pulses  PSYCH: pleasant and cooperative, no obvious depression or anxiety  ASSESSMENT AND PLAN:  Discussed the following assessment and plan:  Intertriginous candidiasis - Plan: Hemoglobin  A1c -see instruction and orders -hgba1c -follow up prn  Carpal tunnel syndrome of right wrist -cock up brace at night -follow up prn  Dysmenorrhea - Plan: CBC (no diff) -ck cbc, follow up with gyn if persists  -Patient advised to return or notify a doctor immediately if symptoms worsen or persist or new concerns arise.  Patient Instructions  BEFORE YOU LEAVE: -flu shot -labs -follow up in 3 months, sooner if needed  FOR THE RASH: -diflucan once daily for three days -then clotrimazole cream with hydrocortisone cream (both available over the counter) twice daily to the area until gone -loose cotton clothing and underwear -avoid starches, sweets, white starchy foods, yeasty foods  FOR THE WRIST: -cock up brace at night       Darlena Koval R.

## 2015-03-07 NOTE — Patient Instructions (Signed)
BEFORE YOU LEAVE: -flu shot -labs -follow up in 3 months, sooner if needed  FOR THE RASH: -diflucan once daily for three days -then clotrimazole cream with hydrocortisone cream (both available over the counter) twice daily to the area until gone -loose cotton clothing and underwear -avoid starches, sweets, white starchy foods, yeasty foods  FOR THE WRIST: -cock up brace at night

## 2015-03-07 NOTE — Progress Notes (Signed)
Pre visit review using our clinic review tool, if applicable. No additional management support is needed unless otherwise documented below in the visit note. 

## 2015-03-08 LAB — CBC
HEMATOCRIT: 37.8 % (ref 36.0–46.0)
Hemoglobin: 12.4 g/dL (ref 12.0–15.0)
MCHC: 32.7 g/dL (ref 30.0–36.0)
MCV: 82.9 fl (ref 78.0–100.0)
PLATELETS: 294 10*3/uL (ref 150.0–400.0)
RBC: 4.56 Mil/uL (ref 3.87–5.11)
RDW: 15.2 % (ref 11.5–15.5)
WBC: 11.2 10*3/uL — AB (ref 4.0–10.5)

## 2015-03-08 LAB — HEMOGLOBIN A1C: Hgb A1c MFr Bld: 5.7 % (ref 4.6–6.5)

## 2015-03-09 ENCOUNTER — Ambulatory Visit: Payer: Managed Care, Other (non HMO) | Admitting: Family Medicine

## 2015-03-10 NOTE — Addendum Note (Signed)
Addended by: Johnella Moloney on: 03/10/2015 04:34 PM   Modules accepted: Orders

## 2015-03-14 ENCOUNTER — Encounter: Payer: Self-pay | Admitting: Family Medicine

## 2015-03-24 ENCOUNTER — Other Ambulatory Visit (INDEPENDENT_AMBULATORY_CARE_PROVIDER_SITE_OTHER): Payer: Managed Care, Other (non HMO)

## 2015-03-24 DIAGNOSIS — D72829 Elevated white blood cell count, unspecified: Secondary | ICD-10-CM

## 2015-03-24 LAB — CBC WITH DIFFERENTIAL/PLATELET
BASOS PCT: 0.4 % (ref 0.0–3.0)
Basophils Absolute: 0 10*3/uL (ref 0.0–0.1)
EOS PCT: 3 % (ref 0.0–5.0)
Eosinophils Absolute: 0.3 10*3/uL (ref 0.0–0.7)
HEMATOCRIT: 36.3 % (ref 36.0–46.0)
HEMOGLOBIN: 11.9 g/dL — AB (ref 12.0–15.0)
LYMPHS PCT: 36.1 % (ref 12.0–46.0)
Lymphs Abs: 3.7 10*3/uL (ref 0.7–4.0)
MCHC: 32.9 g/dL (ref 30.0–36.0)
MCV: 81.6 fl (ref 78.0–100.0)
Monocytes Absolute: 0.7 10*3/uL (ref 0.1–1.0)
Monocytes Relative: 6.4 % (ref 3.0–12.0)
NEUTROS ABS: 5.6 10*3/uL (ref 1.4–7.7)
Neutrophils Relative %: 54.1 % (ref 43.0–77.0)
PLATELETS: 292 10*3/uL (ref 150.0–400.0)
RBC: 4.45 Mil/uL (ref 3.87–5.11)
RDW: 15.4 % (ref 11.5–15.5)
WBC: 10.3 10*3/uL (ref 4.0–10.5)

## 2015-04-02 ENCOUNTER — Other Ambulatory Visit: Payer: Self-pay | Admitting: Family Medicine

## 2015-04-10 ENCOUNTER — Other Ambulatory Visit: Payer: Self-pay

## 2015-04-10 ENCOUNTER — Other Ambulatory Visit: Payer: Self-pay | Admitting: Family Medicine

## 2015-04-24 ENCOUNTER — Other Ambulatory Visit: Payer: Self-pay | Admitting: Family Medicine

## 2015-04-28 ENCOUNTER — Ambulatory Visit: Payer: Managed Care, Other (non HMO) | Admitting: Family Medicine

## 2015-06-12 ENCOUNTER — Other Ambulatory Visit: Payer: Self-pay | Admitting: Family Medicine

## 2015-07-26 ENCOUNTER — Ambulatory Visit: Payer: Managed Care, Other (non HMO) | Admitting: Gastroenterology

## 2015-09-06 ENCOUNTER — Telehealth: Payer: Self-pay | Admitting: Gastroenterology

## 2015-09-06 NOTE — Telephone Encounter (Signed)
Patient with a history of GERD and esophagitis. She was on Protonix BID until last September when it was decreased to daily. Ranitidine was added in the evenings. She has been symptom free until the last 2 to 3 weeks. She has had the "burning mouth" or hot brash taste in her mouth and a burning sensation in her chest. She is taking Tums now in addition to the Protonix and Ranitidine. She has taken Carafate in the past with relief from similar symptoms in the past. She has an appointment 10/13/15. What should she do until the appointment?

## 2015-09-06 NOTE — Telephone Encounter (Signed)
Okay to go back to Protonix twice daily given she has significant heartburn and is not responding to over-the-counter Tums with PPI once daily

## 2015-09-07 NOTE — Telephone Encounter (Signed)
Patient will go to BID dosing with the Protonix. She will call for the new Rx SIG before she runs out of medication.

## 2015-09-12 ENCOUNTER — Telehealth: Payer: Self-pay | Admitting: Gastroenterology

## 2015-09-12 MED ORDER — PANTOPRAZOLE SODIUM 40 MG PO TBEC
40.0000 mg | DELAYED_RELEASE_TABLET | Freq: Two times a day (BID) | ORAL | Status: DC
Start: 2015-09-12 — End: 2015-12-19

## 2015-09-12 NOTE — Telephone Encounter (Signed)
Patient informed that protonix rx sent , she requested a 3 month supply at a time.  Ok per phone note dated 09/06/15.

## 2015-09-15 ENCOUNTER — Other Ambulatory Visit: Payer: Self-pay | Admitting: *Deleted

## 2015-09-15 MED ORDER — CILIDINIUM-CHLORDIAZEPOXIDE 2.5-5 MG PO CAPS: 1.0000 | ORAL_CAPSULE | Freq: Three times a day (TID) | ORAL | Status: DC

## 2015-10-07 ENCOUNTER — Other Ambulatory Visit: Payer: Self-pay | Admitting: Family Medicine

## 2015-10-13 ENCOUNTER — Encounter: Payer: Self-pay | Admitting: Gastroenterology

## 2015-10-13 ENCOUNTER — Ambulatory Visit (INDEPENDENT_AMBULATORY_CARE_PROVIDER_SITE_OTHER): Payer: Managed Care, Other (non HMO) | Admitting: Gastroenterology

## 2015-10-13 VITALS — BP 130/80 | HR 92 | Ht 72.0 in | Wt 270.0 lb

## 2015-10-13 DIAGNOSIS — K6289 Other specified diseases of anus and rectum: Secondary | ICD-10-CM

## 2015-10-13 DIAGNOSIS — K641 Second degree hemorrhoids: Secondary | ICD-10-CM | POA: Diagnosis not present

## 2015-10-13 DIAGNOSIS — K625 Hemorrhage of anus and rectum: Secondary | ICD-10-CM | POA: Diagnosis not present

## 2015-10-13 DIAGNOSIS — K219 Gastro-esophageal reflux disease without esophagitis: Secondary | ICD-10-CM

## 2015-10-13 DIAGNOSIS — K602 Anal fissure, unspecified: Secondary | ICD-10-CM | POA: Diagnosis not present

## 2015-10-13 MED ORDER — AMBULATORY NON FORMULARY MEDICATION: Status: DC

## 2015-10-13 NOTE — Patient Instructions (Signed)
Use Benefiber 1 tablespoon three times  We will give you a prescription of Nitroglycerin ointment rectal cream to use three times a day Follow up in 3 months, please contact the office to schedule that appointment

## 2015-10-13 NOTE — Progress Notes (Signed)
Mallory [name]    [id number]    04/28/1966  Primary Care Physician:BEAL, Lavonna Rua, PA-C  Referring Physician: Terressa Koyanagi, DO [address] [place], [place] [address]  Chief complaint:  GERD, BRBPR  HPI: 50 year old female with history of GERD previously followed by Dr. Juanda Chance is here for follow-up visit Protonix BID and Zantac at bedtime is helping with reflux symptoms, she has tried to decrease Protonix to once daily and has noticed some breakthrough symptoms when she does that. Denies any dysphagia or odynophagia. She also complained of having intermittent bright red blood per rectum mostly when she wipes and also has pain with defecation and immediately following defecation. She can point to a particular spot in her anal canal where she always has burning sensation and also has pain post defecation. She denies having hard stools or constipation. Per patient she has had 2-3 colonoscopies in the past with a different GI group, which were normal. In epic I could see a sigmoidoscopy in 2005 which was normal and possible colonoscopy in 2009 data not available during this office visit  Outpatient Encounter Prescriptions as of 10/13/2015  Medication Sig  . acyclovir (ZOVIRAX) 400 MG tablet Take 1 tablet (400 mg total) by mouth 5 (five) times daily. For episodic treatment of cold sores  . albuterol (PROVENTIL HFA;VENTOLIN HFA) 108 (90 BASE) MCG/ACT inhaler Inhale 2 puffs into the lungs every 6 (six) hours as needed for wheezing.  Marland Kitchen ALPRAZolam (XANAX) 0.5 MG tablet Take 1/2-1 tablet by mouth three times daily as needed for anxiety.  Marland Kitchen atorvastatin (LIPITOR) 20 MG tablet TAKE 1 TABLET BY MOUTH DAILY  . Cholecalciferol (VITAMIN D) 2000 UNITS tablet Take 2,000 Units by mouth every evening.  . clidinium-chlordiazePOXIDE (LIBRAX) 5-2.5 MG capsule Take 1 capsule by mouth 3 (three) times daily before meals. (Patient taking differently: Take 1 capsule by mouth as needed. )  . Coenzyme Q10  (COQ10 PO) Take by mouth.  . cyclobenzaprine (FLEXERIL) 5 MG tablet Take 1 tablet (5 mg total) by mouth 2 (two) times daily as needed for muscle spasms.  . fluticasone (FLONASE) 50 MCG/ACT nasal spray USE 2 SPRAYS IN EACH NOSTRIL DAILY  . hydrocortisone (ANUSOL-HC) 2.5 % rectal cream Place 1 application rectally 2 (two) times daily.  Marland Kitchen levocetirizine (XYZAL) 5 MG tablet TAKE 1 TABLET BY MOUTH EVERY EVENING  . magnesium 30 MG tablet Take 30 mg by mouth 2 (two) times daily.  . Omega-3 Fatty Acids (FISH OIL PO) Take by mouth.  . pantoprazole (PROTONIX) 40 MG tablet Take 1 tablet (40 mg total) by mouth 2 (two) times daily.  . Probiotic Product (PROBIOTIC DAILY PO) Take by mouth.  . promethazine (PHENERGAN) 25 MG tablet Take 1 tablet (25 mg total) by mouth every 6 (six) hours as needed for nausea.  . ranitidine (ZANTAC) 300 MG tablet Take 1 tablet (300 mg total) by mouth at bedtime.  . sertraline (ZOLOFT) 25 MG tablet Take 25 mg by mouth daily.  . Vortioxetine HBr (TRINTELLIX) 10 MG TABS Take 1 tablet by mouth daily.  . Zinc Sulfate (ZINC 15 PO) Take by mouth.  . [DISCONTINUED] sertraline (ZOLOFT) 50 MG tablet TAKE 1 TABLET BY MOUTH EVERY DAY  . [DISCONTINUED] fluconazole (DIFLUCAN) 150 MG tablet Take 1 tablet (150 mg total) by mouth once.  . [DISCONTINUED] hydrocortisone (ANUSOL-HC) 25 MG suppository Place 1 suppository (25 mg total) rectally at bedtime.   No facility-administered encounter medications on file  as of 10/13/2015.    Allergies as of 10/13/2015 - Review Complete 10/13/2015  Allergen Reaction Noted  . Cymbalta [duloxetine hcl] Nausea Only 05/04/2013  . Simvastatin  01/26/2013  . Venlafaxine  07/10/2009    Past Medical History  Diagnosis Date  . Anxiety and depression   . GERD (gastroesophageal reflux disease)   . Allergy     year round  . Hyperlipidemia 2002  . IBS (irritable bowel syndrome) 2008    improved over last couple of years using Align  . Interstitial cystitis  2006    Dr Marcine MatarStephen Dahlstedt  . Pulmonary nodule 09/14/2008    follow up CT 2012 - no further eval advised  . Personal history of kidney stones 1986  . Asthma     mild, intermittent, allergy triggered  . Obesity   . Fibromyalgia 1997    dx by rheumatology, no recent flares  . Hiatal hernia   . Chronic laryngitis   . Pedal edema   . Endometriosis   . Headache(784.0)   . Elevated BP   . Arthritis     PAIN AT TIMES IN BOTH KNEES-? ARTHRITIS  . Varicose veins   . H. pylori infection   . Internal and external hemorrhoids without complication   . Anal fissure   . Pneumonia   . Urinary tract bacterial infections     Past Surgical History  Procedure Laterality Date  . Dilation and curettage of uterus  2002  . Tonsillectomy    . Polypectomy      uterine  . Cholecystectomy N/A 10/28/2012    Procedure: LAPAROSCOPIC CHOLECYSTECTOMY WITH INTRAOPERATIVE CHOLANGIOGRAM;  Surgeon: Almond LintFaera Byerly, MD;  Location: [facility];  Service: General;  Laterality: N/A;    Family History  Problem Relation Age of Onset  . Diabetes Mother   . Hyperlipidemia Mother   . Hypertension Mother   . Breast cancer Mother   . Colon cancer Maternal Aunt   . Arthritis Paternal Grandmother   . Heart disease Paternal Grandmother   . Stroke Father [id number]  . Migraines Sister   . Colon cancer Maternal Grandmother   . Lung cancer Maternal Grandfather   . Ovarian cancer Mother   . Irritable bowel syndrome Mother     Social History   Social History  . Marital Status: Married    Spouse Name: N/A  . Number of Children: 2  . Years of Education: N/A   Occupational History  . Advertising account plannernsurance Agent    Social History Main Topics  . Smoking status: Former Smoker    Types: Cigarettes    Quit date: 06/24/1985  . Smokeless tobacco: Never Used     Comment: when a teenager  . Alcohol Use: No  . Drug Use: No  . Sexual Activity:    Partners: Male   Other Topics Concern  . Not on file   Social History Narrative   Married,  has one grown son and one grown daughter.   Has 2 grandchildren.   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      Review of systems: Review of Systems  Constitutional: Negative for fever and chills.  HENT: Negative.   Eyes: Negative for blurred vision.  Respiratory: Negative for cough, shortness of breath and wheezing.   Cardiovascular: Negative for chest pain and palpitations.  Gastrointestinal: as per HPI Genitourinary: Negative for dysuria, urgency, frequency and hematuria.  Musculoskeletal: Negative for myalgias, back pain and joint pain.  Skin: Negative for itching and  rash.  Neurological: Negative for dizziness, tremors, focal weakness, seizures and loss of consciousness.  Endo/Heme/Allergies: Negative for environmental allergies.  Psychiatric/Behavioral: Negative for depression, suicidal ideas and hallucinations.  All other systems reviewed and are negative.   Physical Exam: Filed Vitals:   10/13/15 1450  BP: 130/80  Pulse: 92   Gen:      No acute distress HEENT:  EOMI, sclera anicteric Neck:     No masses; no thyromegaly Lungs:    Clear to auscultation bilaterally; normal respiratory effort CV:         Regular rate and rhythm; no murmurs Abd:      + bowel sounds; soft, non-tender; no palpable masses, no distension Ext:    No edema; adequate peripheral perfusion Skin:      Warm and dry; no rash Neuro: alert and oriented x 3 Psych: normal mood and affect Rectal exam: Increased anal sphincter tone, and point tenderness anteriorly in the anal canal suggestive of possible anal fissure, no external hemorrhoids. Anoscopy was not performed  Data Reviewed:  Reviewed chart in epic   Assessment and Plan/Recommendations:  50 year old female with history of chronic GERD here for follow-up Patient likely has anal fissure and also small volume hemorrhoidal bleed intermittently We'll first managed anal fissure prior to considering hemorrhoidal  banding Start Benefiber 1 tablespoon 3 times a day Pea-sized nitroglycerin ointment 0.125% apply in the rectum every 8 hours We will obtain records of prior colonoscopy to review Continue PPI and H2 blocker at bedtime Follow antireflux measures Follow-up in 2-3 months  K. Scherry Ran , MD [phone] Mon-Fri 8a-5p [phone] after 5p, weekends, holidays

## 2015-11-18 ENCOUNTER — Other Ambulatory Visit: Payer: Self-pay | Admitting: Family Medicine

## 2015-12-19 ENCOUNTER — Other Ambulatory Visit: Payer: Self-pay | Admitting: Gastroenterology

## 2016-01-23 ENCOUNTER — Telehealth: Payer: Self-pay | Admitting: Gastroenterology

## 2016-01-23 ENCOUNTER — Ambulatory Visit: Payer: Managed Care, Other (non HMO) | Admitting: Gastroenterology

## 2016-01-23 NOTE — Telephone Encounter (Signed)
No fee

## 2016-02-16 ENCOUNTER — Telehealth: Payer: Self-pay | Admitting: *Deleted

## 2016-02-16 MED ORDER — RANITIDINE HCL 300 MG PO TABS
300.0000 mg | ORAL_TABLET | Freq: Every day | ORAL | 3 refills | Status: DC
Start: 2016-02-16 — End: 2018-03-26

## 2016-02-16 NOTE — Telephone Encounter (Signed)
Ranitidine sent in electronically to pharmacy

## 2016-03-21 ENCOUNTER — Other Ambulatory Visit: Payer: Self-pay | Admitting: Gastroenterology

## 2016-04-01 ENCOUNTER — Ambulatory Visit (INDEPENDENT_AMBULATORY_CARE_PROVIDER_SITE_OTHER): Payer: Managed Care, Other (non HMO) | Admitting: Gastroenterology

## 2016-04-01 ENCOUNTER — Encounter: Payer: Self-pay | Admitting: Gastroenterology

## 2016-04-01 VITALS — BP 122/80 | HR 76 | Ht 71.0 in | Wt 273.2 lb

## 2016-04-01 DIAGNOSIS — K588 Other irritable bowel syndrome: Secondary | ICD-10-CM

## 2016-04-01 DIAGNOSIS — K625 Hemorrhage of anus and rectum: Secondary | ICD-10-CM | POA: Diagnosis not present

## 2016-04-01 DIAGNOSIS — K648 Other hemorrhoids: Secondary | ICD-10-CM

## 2016-04-01 MED ORDER — NA SULFATE-K SULFATE-MG SULF 17.5-3.13-1.6 GM/177ML PO SOLN: 1.0000 | Freq: Once | ORAL | 0 refills | Status: AC

## 2016-04-01 MED ORDER — HYDROCORTISONE ACETATE 25 MG RE SUPP: 25.0000 mg | Freq: Every evening | RECTAL | 1 refills | Status: DC | PRN

## 2016-04-01 NOTE — Progress Notes (Signed)
Mallory [name]    [id number]    05/15/1966  Primary Care Physician:BEAL, Lavonna Rua, PA-C  Referring Physician: Ladora Daniel, PA-C [address] [place], [place] [address]  Chief complaint:  GERD, IBS, BRBPR HPI: 50 year old female with history of GERD, IBS is here for follow-up visit. She was last seen 10/13/15. Protonix BID and Zantac at bedtime is helping with reflux symptoms, Denies any dysphagia or odynophagia. She is having intermittent bright red blood per rectum mostly when she wipes .  She denies having hard stools or constipation. Per patient she has had 2-3 colonoscopies in the past with a different GI group, which were normal. Per chart review she had a sigmoidoscopy in 2005 which was normal. She continues to have intermittent bloating and cramping.  Outpatient Encounter Prescriptions as of 04/01/2016  Medication Sig  . acyclovir (ZOVIRAX) 400 MG tablet Take 1 tablet (400 mg total) by mouth 5 (five) times daily. For episodic treatment of cold sores  . albuterol (PROVENTIL HFA;VENTOLIN HFA) 108 (90 BASE) MCG/ACT inhaler Inhale 2 puffs into the lungs every 6 (six) hours as needed for wheezing.  Marland Kitchen ALPRAZolam (XANAX) 0.5 MG tablet Take 1/2-1 tablet by mouth three times daily as needed for anxiety.  . AMBULATORY NON FORMULARY MEDICATION Medication Name: Nitroglycerin Ointment 0.125% use three times a day per rectum  . busPIRone (BUSPAR) 7.5 MG tablet Take 1 tablet by mouth 2 (two) times daily.  . Cholecalciferol (VITAMIN D) 2000 UNITS tablet Take 2,000 Units by mouth every evening.  . clidinium-chlordiazePOXIDE (LIBRAX) 5-2.5 MG capsule Take 1 capsule by mouth 3 (three) times daily before meals. (Patient taking differently: Take 1 capsule by mouth as needed. )  . cyclobenzaprine (FLEXERIL) 5 MG tablet Take 1 tablet (5 mg total) by mouth 2 (two) times daily as needed for muscle spasms.  . fluticasone (FLONASE) 50 MCG/ACT nasal spray USE 2 SPRAYS IN EACH NOSTRIL DAILY  .  hydrocortisone (ANUSOL-HC) 2.5 % rectal cream Place 1 application rectally 2 (two) times daily.  Marland Kitchen levocetirizine (XYZAL) 5 MG tablet TAKE 1 TABLET BY MOUTH EVERY EVENING  . magnesium 30 MG tablet Take 30 mg by mouth 2 (two) times daily.  . pantoprazole (PROTONIX) 40 MG tablet TAKE 1 TABLET (40 MG TOTAL) BY MOUTH 2 (TWO) TIMES DAILY.  . Probiotic Product (PROBIOTIC DAILY PO) Take by mouth.  . promethazine (PHENERGAN) 25 MG tablet Take 1 tablet (25 mg total) by mouth every 6 (six) hours as needed for nausea.  . ranitidine (ZANTAC) 300 MG tablet Take 1 tablet (300 mg total) by mouth at bedtime.  . Zinc Sulfate (ZINC 15 PO) Take by mouth.  . [DISCONTINUED] atorvastatin (LIPITOR) 20 MG tablet TAKE 1 TABLET BY MOUTH DAILY  . [DISCONTINUED] Coenzyme Q10 (COQ10 PO) Take by mouth.  . [DISCONTINUED] Omega-3 Fatty Acids (FISH OIL PO) Take by mouth.  . [DISCONTINUED] sertraline (ZOLOFT) 25 MG tablet Take 25 mg by mouth daily.  . [DISCONTINUED] Vortioxetine HBr (TRINTELLIX) 10 MG TABS Take 1 tablet by mouth daily.   No facility-administered encounter medications on file as of 04/01/2016.     Allergies as of 04/01/2016 - Review Complete 04/01/2016  Allergen Reaction Noted  . Cymbalta [duloxetine hcl] Nausea Only 05/04/2013  . Simvastatin  01/26/2013  . Venlafaxine  07/10/2009    Past Medical History:  Diagnosis Date  . Allergy    year round  . Anal fissure   . Anxiety and depression   .  Arthritis    PAIN AT TIMES IN BOTH KNEES-? ARTHRITIS  . Asthma    mild, intermittent, allergy triggered  . Chronic laryngitis   . Elevated BP   . Endometriosis   . Fibromyalgia 1997   dx by rheumatology, no recent flares  . GERD (gastroesophageal reflux disease)   . H. pylori infection   . Headache(784.0)   . Hiatal hernia   . Hyperlipidemia 2002  . IBS (irritable bowel syndrome) 2008   improved over last couple of years using Align  . Internal and external hemorrhoids without complication   .  Interstitial cystitis 2006   Dr Marcine MatarStephen Dahlstedt  . Obesity   . Pedal edema   . Personal history of kidney stones 1986  . Pneumonia   . Pulmonary nodule 09/14/2008   follow up CT 2012 - no further eval advised  . Urinary tract bacterial infections   . Varicose veins     Past Surgical History:  Procedure Laterality Date  . CHOLECYSTECTOMY N/A 10/28/2012   Procedure: LAPAROSCOPIC CHOLECYSTECTOMY WITH INTRAOPERATIVE CHOLANGIOGRAM;  Surgeon: Almond LintFaera Byerly, MD;  Location: [facility];  Service: General;  Laterality: N/A;  . DILATION AND CURETTAGE OF UTERUS  2002  . POLYPECTOMY     uterine  . TONSILLECTOMY      Family History  Problem Relation Age of Onset  . Diabetes Mother   . Hyperlipidemia Mother   . Hypertension Mother   . Breast cancer Mother   . Ovarian cancer Mother   . Irritable bowel syndrome Mother   . Arthritis Paternal Grandmother   . Heart disease Paternal Grandmother   . Stroke Father [id number]  . Migraines Sister   . Colon cancer Maternal Grandmother   . Lung cancer Maternal Grandfather   . Colon cancer Maternal Aunt     Social History   Social History  . Marital status: Married    Spouse name: N/A  . Number of children: 2  . Years of education: N/A   Occupational History  . Advertising account plannernsurance Agent    Social History Main Topics  . Smoking status: Former Smoker    Types: Cigarettes    Quit date: 06/24/1985  . Smokeless tobacco: Never Used     Comment: when a teenager  . Alcohol use No  . Drug use: No  . Sexual activity: Yes    Partners: Male   Other Topics Concern  . Not on file   Social History Narrative   Married, has one grown son and one grown daughter.   Has 2 grandchildren.   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      Review of systems: Review of Systems  Constitutional: Negative for fever and chills.  HENT: Negative.   Eyes: Negative for blurred vision.  Respiratory: Negative for cough, shortness of breath and wheezing.     Cardiovascular: Negative for chest pain and palpitations.  Gastrointestinal: as per HPI Genitourinary: Negative for dysuria, urgency, frequency and hematuria.  Musculoskeletal: Negative for myalgias, back pain and joint pain.  Skin: Negative for itching and rash.  Neurological: Negative for dizziness, tremors, focal weakness, seizures and loss of consciousness.  Endo/Heme/Allergies: Positive for seasonal allergies.  Psychiatric/Behavioral: Negative for depression, suicidal ideas and hallucinations.  All other systems reviewed and are negative.   Physical Exam: Vitals:   04/01/16 1341  BP: 122/80  Pulse: 76   Body mass index is 38.1 kg/m. Gen:      No acute distress HEENT:  EOMI, sclera  anicteric Neck:     No masses; no thyromegaly Lungs:    Clear to auscultation bilaterally; normal respiratory effort CV:         Regular rate and rhythm; no murmurs Abd:      + bowel sounds; soft, non-tender; no palpable masses, no distension Ext:    No edema; adequate peripheral perfusion Skin:      Warm and dry; no rash Neuro: alert and oriented x 3 Psych: normal mood and affect  Data Reviewed:  Reviewed labs, radiology imaging, old records and pertinent past GI work up   Assessment and Plan/Recommendations: 40 yr F with h/o chronic GERD, IBS and intermittent BRBPR here for follow up visit Low volume BRBPR likely secondary to internal hemorrhoids Patient is due for colorectal cancer screening Will schedule for colonosocopy first and then hemorrhoidal band ligation The risks and benefits as well as alternatives of endoscopic procedure(s) have been discussed and reviewed. All questions answered. The patient agrees to proceed. Anusol suppository at bedtime as needed  IBS: Trial of low fodmap diet and probiotic VSL #3 one capsule daily  GERD: Continue antireflux measures Protonix BID and Zantac at bedtime as needed  25 minutes was spent face-to-face with the patient. Greater than 50% of  the time used for counseling as well as treatment plan and follow-up. She had multiple questions which were answered to her satisfaction  K. Scherry Ran , MD [phone] Mon-Fri 8a-5p [phone] after 5p, weekends, holidays  CC: Ladora Daniel, PA-C

## 2016-04-01 NOTE — Patient Instructions (Addendum)
You have been scheduled for a colonoscopy. Please follow written instructions given to you at your visit today.  Please pick up your prep supplies at the pharmacy within the next 1-3 days. If you use inhalers (even only as needed), please bring them with you on the day of your procedure.   Low FODMAP Given  Use VSL #3 1/2 packet daily of the 450 units and 1 capsule daily of the 112 Units  We will send Anusol suppositories to your pharmacy to use at bedtime as needed    You will need to call back to have your hemorrhoidal banding scheduled after your colonoscopy, We had no available openings at this time

## 2016-04-09 ENCOUNTER — Telehealth: Payer: Self-pay | Admitting: Gastroenterology

## 2016-04-09 NOTE — Telephone Encounter (Signed)
Spoke with patient. She is scheduled for a procedure with her GYN 2 weeks after she has her colonoscopy. Her question is about anesthesia. Is it okay to have 2 events of sedation 2 weeks apart?

## 2016-04-09 NOTE — Telephone Encounter (Signed)
Left message for the patient to call back.

## 2016-04-11 NOTE — Telephone Encounter (Signed)
Patient is aware and thanks me for getting back with her.

## 2016-04-11 NOTE — Telephone Encounter (Signed)
We use mostly propofol and shouldn't interfere and is usually out of system very quickly. No problem from our perspective, she can check with her GYN if still has concerns. Thanks

## 2016-05-22 ENCOUNTER — Encounter: Payer: Managed Care, Other (non HMO) | Admitting: Gastroenterology

## 2016-06-18 ENCOUNTER — Other Ambulatory Visit: Payer: Self-pay | Admitting: Gastroenterology

## 2016-08-29 ENCOUNTER — Other Ambulatory Visit: Payer: Self-pay | Admitting: Gastroenterology

## 2016-09-04 ENCOUNTER — Telehealth: Payer: Self-pay | Admitting: Gastroenterology

## 2016-09-04 NOTE — Telephone Encounter (Signed)
sure

## 2016-09-04 NOTE — Telephone Encounter (Signed)
Unfortunately my practice is too busy at this time to accommodate intra-practice transfers. Sorry

## 2016-09-05 NOTE — Telephone Encounter (Signed)
Spoke with patient and notified her of Dr.Perry's decision. Patient states she would still like to be seen in the practice by any other doctor. DOD for 09/05/16 afternoon is Dr.Danis. Dr.Danis do you accept this patient who wants to transfer care from Dr.Nandigam?

## 2016-09-05 NOTE — Telephone Encounter (Signed)
I will accept on two conditions: I cannot offer treatment or advice until I have seen her, and she needs to reschedule the screening colonoscopy Dr. Lavon PaganiniNandigam recommended.  Patient canceled the August 2017 colonoscopy and has not yet rescheduled.

## 2016-09-05 NOTE — Telephone Encounter (Signed)
Left message for patient to call back to notify her of this.

## 2016-09-17 ENCOUNTER — Other Ambulatory Visit: Payer: Self-pay | Admitting: Gastroenterology

## 2016-10-06 ENCOUNTER — Other Ambulatory Visit: Payer: Self-pay | Admitting: Gastroenterology

## 2016-10-15 ENCOUNTER — Ambulatory Visit: Payer: Managed Care, Other (non HMO) | Admitting: Gastroenterology

## 2016-10-18 ENCOUNTER — Encounter: Payer: Self-pay | Admitting: Obstetrics & Gynecology

## 2016-10-18 ENCOUNTER — Ambulatory Visit (INDEPENDENT_AMBULATORY_CARE_PROVIDER_SITE_OTHER): Payer: 59 | Admitting: Obstetrics & Gynecology

## 2016-10-18 VITALS — BP 130/70 | Ht 71.0 in | Wt 265.0 lb

## 2016-10-18 DIAGNOSIS — Z30011 Encounter for initial prescription of contraceptive pills: Secondary | ICD-10-CM | POA: Diagnosis not present

## 2016-10-18 DIAGNOSIS — Z1151 Encounter for screening for human papillomavirus (HPV): Secondary | ICD-10-CM | POA: Diagnosis not present

## 2016-10-18 DIAGNOSIS — N921 Excessive and frequent menstruation with irregular cycle: Secondary | ICD-10-CM

## 2016-10-18 DIAGNOSIS — Z8041 Family history of malignant neoplasm of ovary: Secondary | ICD-10-CM | POA: Diagnosis not present

## 2016-10-18 DIAGNOSIS — Z01419 Encounter for gynecological examination (general) (routine) without abnormal findings: Secondary | ICD-10-CM

## 2016-10-18 MED ORDER — NORETHINDRONE 0.35 MG PO TABS: 1.0000 | ORAL_TABLET | Freq: Every day | ORAL | 4 refills | Status: DC

## 2016-10-18 NOTE — Progress Notes (Signed)
Mallory [name] 1966/03/21 [id number]   History:    51 y.o. G2P2 Married.  Son is 6 and Daughter 39 yo.  2 grand-children.  New patient presenting for annual gyn exam.  Still menstruating.  Had normal menses q month, but now spotting since LMP 07/2016.  No pelvic pain.  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  Breasts wnl.  Mother died at 41 yo from Stage 4 Ovarian Ca.  Her BrCa1-2 were neg.  Past medical history,surgical history, family history and social history were all reviewed and documented in the EPIC chart.  Gynecologic History No LMP recorded (within days). Contraception: condoms Last Pap: Not up to date, 2014. Results were: normal Last mammogram: Not up to date.   Obstetric History OB History  Gravida Para Term Preterm AB Living  _0 SAB TAB Ectopic Multiple Live Births          2    # Outcome Date GA Lbr Len/2nd Weight Sex Delivery Anes PTL Lv  2 Term 02/05/88 [redacted]w[redacted]d 7 lb 6 oz (3.345 kg) F Vag-Spont   LIV  1 Term 04/17/84 [id number] lb 6 oz (3.345 kg) M Vag-Spont   LIV       ROS: A ROS was performed and pertinent positives and negatives are included in the history.  GENERAL: No fevers or chills. HEENT: No change in vision, no earache, sore throat or sinus congestion. NECK: No pain or stiffness. CARDIOVASCULAR: No chest pain or pressure. No palpitations. PULMONARY: No shortness of breath, cough or wheeze. GASTROINTESTINAL: No abdominal pain, nausea, vomiting or diarrhea, melena or bright red blood per rectum. GENITOURINARY: No urinary frequency, urgency, hesitancy or dysuria. MUSCULOSKELETAL: No joint or muscle pain, no back pain, no recent trauma. DERMATOLOGIC: No rash, no itching, no lesions. ENDOCRINE: No polyuria, polydipsia, no heat or cold intolerance. No recent change in weight. HEMATOLOGICAL: No anemia or easy bruising or bleeding. NEUROLOGIC: No headache, seizures, numbness, tingling or weakness. PSYCHIATRIC: No  depression, no loss of interest in normal activity or change in sleep pattern.     Exam:   BP 130/70   Ht _1  (1.803 m)   Wt 265 lb (120.2 kg)   LMP  (Within Days) Comment: Feb 2018 - spotting since  BMI 36.96 kg/m   Body mass index is 36.96 kg/m.  General appearance : Well developed well nourished female. No acute distress HEENT: Eyes: no retinal hemorrhage or exudates,  Neck supple, trachea midline, no carotid bruits, no thyroidmegaly Lungs: Clear to auscultation, no rhonchi or wheezes, or rib retractions  Heart: Regular rate and rhythm, no murmurs or gallops Breast:Examined in sitting and supine position were symmetrical in appearance, no palpable masses or tenderness,  no skin retraction, no nipple inversion, no nipple discharge, no skin discoloration, no axillary or supraclavicular lymphadenopathy Abdomen: no palpable masses or tenderness, no rebound or guarding Extremities: no edema or skin discoloration or tenderness  Pelvic:  Bartholin, Urethra, Skene Glands: Within normal limits             Vagina: No gross lesions or discharge  Cervix: No gross lesions or discharge.  Pap done.  Uterus  AV, normal size, shape and consistency, non-tender and mobile  Adnexa  Without masses or tenderness  Anus and perineum  normal    Assessment/Plan:  [age].o. female for annual exam  1. Encounter for routine gynecological examination with Papanicolaou smear of  cervix Normal Gyn exam with Obesity.  Pap done.  Schedule screening Mammo and Colonoscopy. - PAP,TP IMGw/HPV RNA,rflx [id number],28/24  2. Metrorrhagia Probably Perimenopausal, but will f/u with a Pelvic US to r/o IU Pathology. Start on Progestin-only Pill to control cycle and contraception.  3. FH: ovarian cancer in first degree relative Mother with Stage 4 Ovarian Ca deceased at 51 yo.  BrCa1-2 neg. F/U Pelvic US to assess Ovaries.  4. Encounter for initial prescription of contraceptive pills OrthoMicronor.   Usage/risks/benefits reviewed.  5. Morbid obesity ([place]) Regular physical activity and low Carb diet reviewed in details.  [place] diet discussed and recommended.  6. Special screening examination for human papillomavirus (HPV) HR HPV done on Pap. - PAP,TP IMGw/HPV RNA,rflx [id number],45/91  Counseling >51% x 20 min on above issues.  Princess Bruins MD, 9:17 AM 10/18/2016

## 2016-10-19 NOTE — Patient Instructions (Signed)
1. Encounter for routine gynecological examination with Papanicolaou smear of cervix Normal Gyn exam with Obesity.  Pap done.  Schedule screening Mammo and Colonoscopy. - PAP,TP IMGw/HPV RNA,rflx [id number],80/99  2. Metrorrhagia Probably Perimenopausal, but will f/u with a Pelvic US to r/o IU Pathology. Start on Progestin-only Pill to control cycle and contraception.  3. FH: ovarian cancer in first degree relative Mother with Stage 4 Ovarian Ca deceased at 52 yo.  BrCa1-2 neg. F/U Pelvic US to assess Ovaries.  4. Encounter for initial prescription of contraceptive pills OrthoMicronor.  Usage/risks/benefits reviewed.  5. Morbid obesity ([place]) Regular physical activity and low Carb diet reviewed in details.  [place] diet discussed and recommended  It was a pleasure to meet you today, see you soon!

## 2016-10-21 ENCOUNTER — Other Ambulatory Visit: Payer: Self-pay | Admitting: Obstetrics & Gynecology

## 2016-10-21 DIAGNOSIS — N939 Abnormal uterine and vaginal bleeding, unspecified: Secondary | ICD-10-CM

## 2016-10-23 ENCOUNTER — Ambulatory Visit (INDEPENDENT_AMBULATORY_CARE_PROVIDER_SITE_OTHER): Payer: 59

## 2016-10-23 ENCOUNTER — Ambulatory Visit (INDEPENDENT_AMBULATORY_CARE_PROVIDER_SITE_OTHER): Payer: 59 | Admitting: Obstetrics & Gynecology

## 2016-10-23 ENCOUNTER — Other Ambulatory Visit: Payer: Self-pay | Admitting: Obstetrics & Gynecology

## 2016-10-23 DIAGNOSIS — N939 Abnormal uterine and vaginal bleeding, unspecified: Secondary | ICD-10-CM

## 2016-10-23 DIAGNOSIS — N83201 Unspecified ovarian cyst, right side: Secondary | ICD-10-CM | POA: Diagnosis not present

## 2016-10-23 DIAGNOSIS — R9389 Abnormal findings on diagnostic imaging of other specified body structures: Secondary | ICD-10-CM

## 2016-10-23 DIAGNOSIS — N7011 Chronic salpingitis: Secondary | ICD-10-CM | POA: Diagnosis not present

## 2016-10-23 DIAGNOSIS — R938 Abnormal findings on diagnostic imaging of other specified body structures: Secondary | ICD-10-CM

## 2016-10-23 DIAGNOSIS — N9489 Other specified conditions associated with female genital organs and menstrual cycle: Secondary | ICD-10-CM

## 2016-10-23 DIAGNOSIS — R87619 Unspecified abnormal cytological findings in specimens from cervix uteri: Secondary | ICD-10-CM

## 2016-10-23 DIAGNOSIS — N83202 Unspecified ovarian cyst, left side: Secondary | ICD-10-CM

## 2016-10-23 DIAGNOSIS — N949 Unspecified condition associated with female genital organs and menstrual cycle: Secondary | ICD-10-CM | POA: Diagnosis not present

## 2016-10-23 LAB — PAP, TP IMAGING W/ HPV RNA, RFLX HPV TYPE 16,18/45: HPV mRNA, High Risk: NOT DETECTED

## 2016-10-23 NOTE — Patient Instructions (Signed)
1. Atypical glandular cells of undetermined significance (AGUS) on cervical Pap smear EBx done today.  F/U to complete with Colposcopy/ECC.  Information given on AGUS/Colpo procedure. - Pathology Report  2. Thickened endometrium EBx done today.  No Cx. - Pathology Report  3. Cysts of both ovaries Probable small bilateral Hydrosalpinges.  Patient reassured.  Completing evaluation with Ca125 given findings and Mother's h/o Ovarian Ca. - CA 125  See you soon for Colposcopy/ECC.

## 2016-10-23 NOTE — Progress Notes (Signed)
    Mallory [name] [birth year] [id number]        51 y.o.  G2P2002   F/U Pelvic US for Metrorrhagia and Mother with Ovarian Cancer  Started on Progestin-only pill, well tolerated.  Past medical history,surgical history, problem list, medications, allergies, family history and social history were all reviewed and documented in the EPIC chart.  Directed ROS with pertinent positives and negatives documented in the history of present illness/assessment and plan.  Exam:  There were no vitals filed for this visit. General appearance:  Normal  Pelvic US today:  T/V Uterus Retroverted homogeneous.  Endometrial lining 29.7 mm, no increased blood flow by Doppler.  Rt Ovary with small follicle and probable small Hydrosalpinx 3.0 x 1.0 x 2.1 cm.  Lt Ovary with small follicle and probable Hydrosalpinx as well, 2.3 x 2.0 x 2.2 cm.  No increased blood flow to Cystic tubular structures by Doppler bilaterally.  No FF in CDS. Verbal consent for EBx obtained. Endometrial Bx:  Betadine prep on cervix.  Hurricane spray.  Uterus retroverted by [place].  Hysterometry 8 cm.  Easy insertion of Endometrial Bx canula.  Moderate specimen.  Sent to patho.  No Cx.  Well tolerated.  Assessment/Plan:  51 y.o. G2P2002   1. Atypical glandular cells of undetermined significance (AGUS) on cervical Pap smear EBx done today.  F/U to complete with Colposcopy/ECC.  Information given on AGUS/Colpo procedure. - Pathology Report  2. Thickened endometrium EBx done today.  No Cx. - Pathology Report  3. Cysts of both ovaries Probable small bilateral Hydrosalpinges.  Patient reassured.  Completing evaluation with Ca125 given findings and Mother's h/o Ovarian Ca. - CA 125  Counseling on above issues >50% x 15 minutes. Genia Del MD, 12:05 PM 10/23/2016

## 2016-10-24 LAB — PATHOLOGY

## 2016-10-24 LAB — CA 125: CA 125: 25 U/mL (ref <35)

## 2016-11-04 ENCOUNTER — Telehealth: Payer: Self-pay | Admitting: *Deleted

## 2016-11-04 NOTE — Telephone Encounter (Signed)
Pt informed will keep appointment

## 2016-11-04 NOTE — Telephone Encounter (Signed)
Yes, keep Colpo appointment.  Ok to take NSAIDS.  It will not make menstrual bleeding worse, can actually decrease it if taken regularly and for Colpo, minor procedure, will not make a difference for bleeding from biopsies.

## 2016-11-04 NOTE — Telephone Encounter (Signed)
1. Pt scheduled C&B on 11/05/16 started bleeding on Thursday pm, moderate bleeding asked if she should keep appointment?  2. Pt having cramping taking Aleve asked if okay to take this, her reason for asking is that she read that Nsaid could make bleeding worse?   Please advise

## 2016-11-05 ENCOUNTER — Encounter: Payer: Self-pay | Admitting: Obstetrics & Gynecology

## 2016-11-05 ENCOUNTER — Ambulatory Visit (INDEPENDENT_AMBULATORY_CARE_PROVIDER_SITE_OTHER): Payer: 59 | Admitting: Obstetrics & Gynecology

## 2016-11-05 VITALS — BP 138/86

## 2016-11-05 DIAGNOSIS — R87619 Unspecified abnormal cytological findings in specimens from cervix uteri: Secondary | ICD-10-CM | POA: Diagnosis not present

## 2016-11-05 DIAGNOSIS — N8501 Benign endometrial hyperplasia: Secondary | ICD-10-CM | POA: Diagnosis not present

## 2016-11-05 NOTE — Patient Instructions (Signed)
1. Atypical glandular cells of undetermined significance (AGUS) on cervical Pap smear  HPV HR neg.  R/O Cervical Dysplasia.  Pending Bxs/ECC.    2. Endometrial hyperplasia without atypia, simple EBx Hyperplasia without atypia and suggestive of Endometrial Polyp.  Decision to proceed with HSC/Resection/D+C.  Surgery and risks reviewed including trauma, infection, hemorrhage.  Pamphlet given.  Mallory [name], it was good to see you today!  I will inform you of your results as soon as available.  I sent a message to schedule your surgery, you will receive a phone call from my office soon.

## 2016-11-05 NOTE — Progress Notes (Signed)
     Viviano Simasammy L Ullman 07/09/1965 [id number]        51 y.o.  G2P2002   RP:  AGUS/HPV HR neg for Colposcopy.  Endometrial Bx done 10/23/2016:  Hyperplasia without atypia, suggestive of Endometrial Polyp.  Started on Norethindrone.  Ca125 normal.  Done for small Ovarian Cysts on Pelvic US and mother with h/o Ovarian Ca.  Past medical history,surgical history, problem list, medications, allergies, family history and social history were all reviewed and documented in the EPIC chart.  Directed ROS with pertinent positives and negatives documented in the history of present illness/assessment and plan.  Exam:  Vitals:   11/05/16 1042  BP: 138/86   General appearance:  Normal  Colposcopy Procedure Note Taviana L Rumore 11/05/2016  Indications:  AGUS/HPV HR neg  Procedure Details  The risks and benefits of the procedure and Verbal informed consent obtained.  Speculum placed in vagina and excellent visualization of cervix achieved, cervix swabbed x 3 with acetic acid solution.  Findings:  Cervix colposcopy:  AW with punctation at 12 and 5 O'clock.  Bxs taken.  ECC done.  Physical Exam  Genitourinary:      Vaginal colposcopy:  Normal   Vulvar colposcopy:  Normal  Perirectal colposcopy: Not done, grossely normal  Specimens: Cervical Bxs and ECC  Complications: None .  Assessment/Plan:    1. Atypical glandular cells of undetermined significance (AGUS) on cervical Pap smear  HPV HR neg.  R/O Cervical Dysplasia.  Pending Bxs/ECC.    2. Endometrial hyperplasia without atypia, simple EBx Hyperplasia without atypia and suggestive of Endometrial Polyp.  Decision to proceed with HSC/Resection/D+C.  Surgery and risks reviewed including trauma, infection, hemorrhage.  Pamphlet given.  Counseling on above issues >50% x 15 minutes.  Genia DelMarie-Lyne Tzion Wedel MD  11/05/2016 at 11:28 am

## 2016-11-07 ENCOUNTER — Other Ambulatory Visit: Payer: Self-pay | Admitting: Obstetrics & Gynecology

## 2016-11-07 ENCOUNTER — Encounter: Payer: Self-pay | Admitting: Obstetrics & Gynecology

## 2016-11-07 ENCOUNTER — Other Ambulatory Visit: Payer: Self-pay

## 2016-11-07 MED ORDER — NORETHINDRONE ACETATE 5 MG PO TABS: ORAL_TABLET | ORAL | 0 refills | Status: DC

## 2016-11-08 ENCOUNTER — Encounter: Payer: Self-pay | Admitting: Obstetrics & Gynecology

## 2016-11-08 LAB — PATHOLOGY

## 2016-11-11 ENCOUNTER — Encounter: Payer: Self-pay | Admitting: Anesthesiology

## 2016-11-13 ENCOUNTER — Encounter: Payer: Self-pay | Admitting: Obstetrics & Gynecology

## 2016-11-21 ENCOUNTER — Encounter (HOSPITAL_BASED_OUTPATIENT_CLINIC_OR_DEPARTMENT_OTHER): Payer: Self-pay | Admitting: *Deleted

## 2016-11-21 NOTE — Progress Notes (Signed)
NPO AFTER MN W/ EXCEPTION CLEAR LIQUIDS UNTIL 0700 (NO CREAM/ MILK PRODUCTS).  ARRIVE AT 1130.  NEEDS URINE PREG.  GETTING CBC DONE Friday 11-22-2016. WILL TAKE AM MEDS W/ SIPS OF WATER.

## 2016-11-22 ENCOUNTER — Encounter (HOSPITAL_BASED_OUTPATIENT_CLINIC_OR_DEPARTMENT_OTHER): Payer: Self-pay | Admitting: *Deleted

## 2016-11-22 DIAGNOSIS — F329 Major depressive disorder, single episode, unspecified: Secondary | ICD-10-CM | POA: Diagnosis not present

## 2016-11-22 DIAGNOSIS — M797 Fibromyalgia: Secondary | ICD-10-CM | POA: Diagnosis not present

## 2016-11-22 DIAGNOSIS — F419 Anxiety disorder, unspecified: Secondary | ICD-10-CM | POA: Diagnosis not present

## 2016-11-22 DIAGNOSIS — N8500 Endometrial hyperplasia, unspecified: Secondary | ICD-10-CM | POA: Diagnosis present

## 2016-11-22 DIAGNOSIS — K449 Diaphragmatic hernia without obstruction or gangrene: Secondary | ICD-10-CM | POA: Diagnosis not present

## 2016-11-22 DIAGNOSIS — K219 Gastro-esophageal reflux disease without esophagitis: Secondary | ICD-10-CM | POA: Diagnosis not present

## 2016-11-22 DIAGNOSIS — E785 Hyperlipidemia, unspecified: Secondary | ICD-10-CM | POA: Diagnosis not present

## 2016-11-22 DIAGNOSIS — Z87891 Personal history of nicotine dependence: Secondary | ICD-10-CM | POA: Diagnosis not present

## 2016-11-22 DIAGNOSIS — M1990 Unspecified osteoarthritis, unspecified site: Secondary | ICD-10-CM | POA: Diagnosis not present

## 2016-11-22 DIAGNOSIS — Z87442 Personal history of urinary calculi: Secondary | ICD-10-CM | POA: Diagnosis not present

## 2016-11-22 LAB — CBC
HEMATOCRIT: 36.4 % (ref 36.0–46.0)
Hemoglobin: 11.6 g/dL — ABNORMAL LOW (ref 12.0–15.0)
MCH: 25.9 pg — ABNORMAL LOW (ref 26.0–34.0)
MCHC: 31.9 g/dL (ref 30.0–36.0)
MCV: 81.3 fL (ref 78.0–100.0)
Platelets: 307 10*3/uL (ref 150–400)
RBC: 4.48 MIL/uL (ref 3.87–5.11)
RDW: 15.7 % — ABNORMAL HIGH (ref 11.5–15.5)
WBC: 9.3 10*3/uL (ref 4.0–10.5)

## 2016-11-28 ENCOUNTER — Ambulatory Visit (HOSPITAL_BASED_OUTPATIENT_CLINIC_OR_DEPARTMENT_OTHER): Payer: 59 | Admitting: Anesthesiology

## 2016-11-28 ENCOUNTER — Encounter (HOSPITAL_BASED_OUTPATIENT_CLINIC_OR_DEPARTMENT_OTHER): Payer: Self-pay

## 2016-11-28 ENCOUNTER — Encounter (HOSPITAL_BASED_OUTPATIENT_CLINIC_OR_DEPARTMENT_OTHER)
Admission: RE | Disposition: A | Discharge status: Home / Self Care | Payer: Self-pay | Source: Ambulatory Visit | Attending: Obstetrics & Gynecology

## 2016-11-28 ENCOUNTER — Ambulatory Visit (HOSPITAL_BASED_OUTPATIENT_CLINIC_OR_DEPARTMENT_OTHER)
Admission: RE | Admit: 2016-11-28 | Discharge: 2016-11-28 | Disposition: A | Discharge status: Home / Self Care | Payer: 59 | Source: Ambulatory Visit | Attending: Obstetrics & Gynecology | Admitting: Obstetrics & Gynecology

## 2016-11-28 DIAGNOSIS — K219 Gastro-esophageal reflux disease without esophagitis: Secondary | ICD-10-CM | POA: Insufficient documentation

## 2016-11-28 DIAGNOSIS — F329 Major depressive disorder, single episode, unspecified: Secondary | ICD-10-CM | POA: Insufficient documentation

## 2016-11-28 DIAGNOSIS — Z87442 Personal history of urinary calculi: Secondary | ICD-10-CM | POA: Insufficient documentation

## 2016-11-28 DIAGNOSIS — M1990 Unspecified osteoarthritis, unspecified site: Secondary | ICD-10-CM | POA: Insufficient documentation

## 2016-11-28 DIAGNOSIS — F419 Anxiety disorder, unspecified: Secondary | ICD-10-CM | POA: Insufficient documentation

## 2016-11-28 DIAGNOSIS — N8500 Endometrial hyperplasia, unspecified: Secondary | ICD-10-CM | POA: Diagnosis not present

## 2016-11-28 DIAGNOSIS — Z87891 Personal history of nicotine dependence: Secondary | ICD-10-CM | POA: Insufficient documentation

## 2016-11-28 DIAGNOSIS — M797 Fibromyalgia: Secondary | ICD-10-CM | POA: Insufficient documentation

## 2016-11-28 DIAGNOSIS — N8501 Benign endometrial hyperplasia: Secondary | ICD-10-CM | POA: Diagnosis not present

## 2016-11-28 DIAGNOSIS — N840 Polyp of corpus uteri: Secondary | ICD-10-CM | POA: Diagnosis not present

## 2016-11-28 DIAGNOSIS — E785 Hyperlipidemia, unspecified: Secondary | ICD-10-CM | POA: Insufficient documentation

## 2016-11-28 DIAGNOSIS — K449 Diaphragmatic hernia without obstruction or gangrene: Secondary | ICD-10-CM | POA: Insufficient documentation

## 2016-11-28 HISTORY — DX: Presence of spectacles and contact lenses: Z97.3

## 2016-11-28 HISTORY — DX: Stress incontinence (female) (male): N39.3

## 2016-11-28 HISTORY — DX: Personal history of other diseases of the digestive system: Z87.19

## 2016-11-28 HISTORY — DX: Mild intermittent asthma, uncomplicated: J45.20

## 2016-11-28 HISTORY — DX: Other allergic rhinitis: J30.89

## 2016-11-28 HISTORY — PX: DILATATION & CURETTAGE/HYSTEROSCOPY WITH MYOSURE: SHX6511

## 2016-11-28 HISTORY — DX: Endometrial hyperplasia, unspecified: N85.00

## 2016-11-28 HISTORY — DX: Personal history of other infectious and parasitic diseases: Z86.19

## 2016-11-28 LAB — POCT PREGNANCY, URINE: Preg Test, Ur: NEGATIVE

## 2016-11-28 SURGERY — DILATATION & CURETTAGE/HYSTEROSCOPY WITH MYOSURE
Anesthesia: General | Site: Vagina

## 2016-11-28 MED ORDER — MIDAZOLAM HCL 2 MG/2ML IJ SOLN
INTRAMUSCULAR | Status: AC
  Filled 2016-11-28: qty 2

## 2016-11-28 MED ORDER — DEXAMETHASONE SODIUM PHOSPHATE 4 MG/ML IJ SOLN
INTRAMUSCULAR | Status: DC | PRN
  Administered 2016-11-28: 10 mg via INTRAVENOUS

## 2016-11-28 MED ORDER — ONDANSETRON HCL 4 MG/2ML IJ SOLN
INTRAMUSCULAR | Status: DC | PRN
  Administered 2016-11-28: 4 mg via INTRAVENOUS

## 2016-11-28 MED ORDER — OXYCODONE HCL 5 MG PO TABS
5.0000 mg | ORAL_TABLET | Freq: Once | ORAL | Status: DC | PRN
Start: 2016-11-28 — End: 2016-11-28
  Filled 2016-11-28: qty 1

## 2016-11-28 MED ORDER — LACTATED RINGERS IV SOLN
INTRAVENOUS | Status: DC
  Administered 2016-11-28: 12:00:00 via INTRAVENOUS
  Filled 2016-11-28: qty 1000

## 2016-11-28 MED ORDER — LIDOCAINE HCL 1 % IJ SOLN
INTRAMUSCULAR | Status: DC | PRN
  Administered 2016-11-28: 100 mg via INTRADERMAL

## 2016-11-28 MED ORDER — FENTANYL CITRATE (PF) 100 MCG/2ML IJ SOLN
25.0000 ug | INTRAMUSCULAR | Status: DC | PRN
  Administered 2016-11-28: 50 ug via INTRAVENOUS
  Filled 2016-11-28: qty 1

## 2016-11-28 MED ORDER — PROPOFOL 10 MG/ML IV BOLUS
INTRAVENOUS | Status: AC
  Filled 2016-11-28: qty 40

## 2016-11-28 MED ORDER — ONDANSETRON HCL 4 MG/2ML IJ SOLN
4.0000 mg | Freq: Once | INTRAMUSCULAR | Status: DC | PRN
  Filled 2016-11-28: qty 2

## 2016-11-28 MED ORDER — OXYCODONE HCL 5 MG/5ML PO SOLN
5.0000 mg | Freq: Once | ORAL | Status: DC | PRN
  Filled 2016-11-28: qty 5

## 2016-11-28 MED ORDER — CHLOROPROCAINE HCL 1 % IJ SOLN
INTRAMUSCULAR | Status: DC | PRN
  Administered 2016-11-28: 20 mL

## 2016-11-28 MED ORDER — CEFAZOLIN SODIUM-DEXTROSE 2-4 GM/100ML-% IV SOLN
2.0000 g | INTRAVENOUS | Status: AC
  Administered 2016-11-28: 2 g via INTRAVENOUS
  Filled 2016-11-28: qty 100

## 2016-11-28 MED ORDER — PROPOFOL 10 MG/ML IV BOLUS
INTRAVENOUS | Status: DC | PRN
  Administered 2016-11-28: 140 mg via INTRAVENOUS

## 2016-11-28 MED ORDER — OXYCODONE-ACETAMINOPHEN 7.5-325 MG PO TABS: 1.0000 | ORAL_TABLET | ORAL | 0 refills | Status: DC | PRN

## 2016-11-28 MED ORDER — KETOROLAC TROMETHAMINE 30 MG/ML IJ SOLN
INTRAMUSCULAR | Status: DC | PRN
  Administered 2016-11-28: 30 mg via INTRAVENOUS

## 2016-11-28 MED ORDER — CEFAZOLIN SODIUM-DEXTROSE 2-4 GM/100ML-% IV SOLN
INTRAVENOUS | Status: AC
  Filled 2016-11-28: qty 100

## 2016-11-28 MED ORDER — FENTANYL CITRATE (PF) 100 MCG/2ML IJ SOLN
INTRAMUSCULAR | Status: AC
  Filled 2016-11-28: qty 2

## 2016-11-28 MED ORDER — SODIUM CHLORIDE 0.9 % IR SOLN
Status: DC | PRN
  Administered 2016-11-28: 2000 mL

## 2016-11-28 MED ORDER — LACTATED RINGERS IV SOLN
INTRAVENOUS | Status: DC
  Filled 2016-11-28: qty 1000

## 2016-11-28 MED ORDER — MIDAZOLAM HCL 5 MG/5ML IJ SOLN
INTRAMUSCULAR | Status: DC | PRN
  Administered 2016-11-28: 2 mg via INTRAVENOUS

## 2016-11-28 MED ORDER — ONDANSETRON HCL 4 MG/2ML IJ SOLN
INTRAMUSCULAR | Status: AC
  Filled 2016-11-28: qty 2

## 2016-11-28 MED ORDER — FENTANYL CITRATE (PF) 100 MCG/2ML IJ SOLN
INTRAMUSCULAR | Status: DC | PRN
Start: 2016-11-28 — End: 2016-11-28
  Administered 2016-11-28: 25 ug via INTRAVENOUS
  Administered 2016-11-28: 50 ug via INTRAVENOUS
  Administered 2016-11-28: 25 ug via INTRAVENOUS

## 2016-11-28 SURGICAL SUPPLY — 23 items
CANISTER SUCT 3000ML PPV (MISCELLANEOUS) ×3 IMPLANT
CATH ROBINSON RED A/P 16FR (CATHETERS) ×3 IMPLANT
DEVICE MYOSURE LITE (MISCELLANEOUS) ×3 IMPLANT
DEVICE MYOSURE REACH (MISCELLANEOUS) IMPLANT
DILATOR CANAL MILEX (MISCELLANEOUS) IMPLANT
ELECT REM PT RETURN 9FT ADLT (ELECTROSURGICAL)
ELECTRODE REM PT RTRN 9FT ADLT (ELECTROSURGICAL) IMPLANT
FILTER ARTHROSCOPY CONVERTOR (FILTER) ×3 IMPLANT
GLOVE BIO SURGEON STRL SZ 6.5 (GLOVE) ×2 IMPLANT
GLOVE BIO SURGEONS STRL SZ 6.5 (GLOVE) ×1
GLOVE BIOGEL PI IND STRL 7.0 (GLOVE) ×2 IMPLANT
GLOVE BIOGEL PI INDICATOR 7.0 (GLOVE) ×4
GOWN STRL REUS W/TWL LRG LVL3 (GOWN DISPOSABLE) ×6 IMPLANT
IV NS IRRIG 3000ML ARTHROMATIC (IV SOLUTION) ×6 IMPLANT
MYOSURE XL FIBROID REM (MISCELLANEOUS)
PACK VAGINAL MINOR WOMEN LF (CUSTOM PROCEDURE TRAY) ×3 IMPLANT
PAD OB MATERNITY 4.3X12.25 (PERSONAL CARE ITEMS) ×3 IMPLANT
PAD PREP 24X48 CUFFED NSTRL (MISCELLANEOUS) ×3 IMPLANT
SEAL ROD LENS SCOPE MYOSURE (ABLATOR) ×3 IMPLANT
SYSTEM TISS REMOVAL MYSR XL RM (MISCELLANEOUS) IMPLANT
TOWEL OR 17X24 6PK STRL BLUE (TOWEL DISPOSABLE) ×6 IMPLANT
TUBING AQUILEX INFLOW (TUBING) ×3 IMPLANT
TUBING AQUILEX OUTFLOW (TUBING) ×3 IMPLANT

## 2016-11-28 NOTE — Op Note (Signed)
   Operative Note  11/28/2016  1:43 PM  PATIENT:  Mallory [name]  51 y.o. female  PRE-OPERATIVE DIAGNOSIS:  Endometrial hyperplasia without Atypia, suggestive of a polyp  POST-OPERATIVE DIAGNOSIS:  Endometrial hyperplasia without Atypia, suggestive of a polyp  PROCEDURE:  Procedure(s): DILATATION & CURETTAGE/HYSTEROSCOPY RESECTION WITH MYOSURE  SURGEON:  Surgeon(s): Genia DelLavoie, Marie-Lyne, MD  ANESTHESIA:   general  FINDINGS:  Endometrial polyp on left anterior intrauterine wall  DESCRIPTION OF OPERATION:  Under general anesthesia with laryngeal mask the patient is in lithotomy position. She is prepped with Betadine on the suprapubic, vulvar and vaginal areas. She is draped as usual.  The vaginal exam reveals a retroverted uterus normal volume, no adnexal mass. The speculum is inserted in the vagina and the anterior lip of the cervix is grasped with a tenaculum. A paracervical block is done with Nesacaine 1% a total of 20 cc at 4 and 8:00.  The hysterometry is at 9 cm. Dilation of the cervix with Hegar dilators up to #29 without difficulty.  Insertion of the hysteroscope.  Visualization of the intrauterine cavity revealing 2 normal ostia, a 1 cm polyp is present on the left anterior intrauterine wall.  Pictures were taken. The Myosure device is added.  Resection of the intrauterine polyp.  Areas of the endometrium are slightly thickened and are also resected with the Myosure.  The hysteroscope is then removed. A small sharp curette is inserted in the intrauterine cavity in a systematic curettage is done on all endometrial walls. The curette is then removed.  Both specimens are sent together to pathology.  The tenaculum is removed from the cervix and the speculum is removed from the vagina.  The patient is brought to recovery room in good and stable status.  ESTIMATED BLOOD LOSS: 10 cc FLUID DEFICIT: 400 cc   Intake/Output Summary (Last 24 hours) at 11/28/16 1343 Last data filed at 11/28/16  1330  Gross per 24 hour  Intake                0 ml  Output               60 ml  Net              -60 ml     BLOOD ADMINISTERED:none   LOCAL MEDICATIONS USED:  Paracervical Block with Nesacaine 1% 20 cc  SPECIMEN:  Source of Specimen:  Endometrial Polyp resection material and Endometrial curettings  DISPOSITION OF SPECIMEN:  PATHOLOGY  COUNTS:  YES  PLAN OF CARE: Transfer to PACU  Marie-Lyne LavoieMD1:43 PM

## 2016-11-28 NOTE — Discharge Instructions (Addendum)
Hysteroscopy, Care After °Refer to this sheet in the next few weeks. These instructions provide you with information on caring for yourself after your procedure. Your health care provider may also give you more specific instructions. Your treatment has been planned according to current medical practices, but problems sometimes occur. Call your health care provider if you have any problems or questions after your procedure. °What can I expect after the procedure? °After your procedure, it is typical to have the following: °· You may have some cramping. This normally lasts for a couple days. °· You may have bleeding. This can vary from light spotting for a few days to menstrual-like bleeding for 3-7 days. ° °Follow these instructions at home: °· Rest for the first 1-2 days after the procedure. °· Only take over-the-counter or prescription medicines as directed by your health care provider. Do not take aspirin. It can increase the chances of bleeding. °· Take showers instead of baths for 2 weeks or as directed by your health care provider. °· Do not drive for 24 hours or as directed. °· Do not drink alcohol while taking pain medicine. °· Do not use tampons, douche, or have sexual intercourse for 2 weeks or until your health care provider says it is okay. °· Take your temperature twice a day for 4-5 days. Write it down each time. °· Follow your health care provider's advice about diet, exercise, and lifting. °· If you develop constipation, you may: °? Take a mild laxative if your health care provider approves. °? Add bran foods to your diet. °? Drink enough fluids to keep your urine clear or pale yellow. °· Try to have someone with you or available to you for the first 24-48 hours, especially if you were given a general anesthetic. °· Follow up with your health care provider as directed. °Contact a health care provider if: °· You feel dizzy or lightheaded. °· You feel sick to your stomach (nauseous). °· You have  abnormal vaginal discharge. °· You have a rash. °· You have pain that is not controlled with medicine. °Get help right away if: °· You have bleeding that is heavier than a normal menstrual period. °· You have a fever. °· You have increasing cramps or pain, not controlled with medicine. °· You have new belly (abdominal) pain. °· You pass out. °· You have pain in the tops of your shoulders (shoulder strap areas). °· You have shortness of breath. °This information is not intended to replace advice given to you by your health care provider. Make sure you discuss any questions you have with your health care provider. °Document Released: 03/31/2013 Document Revised: 11/16/2015 Document Reviewed: 01/07/2013 °Elsevier Interactive Patient Education © 2017 [employer] ° ° °Post Anesthesia Home Care Instructions ° °Activity: °Get plenty of rest for the remainder of the day. A responsible individual must stay with you for 24 hours following the procedure.  °For the next 24 hours, DO NOT: °-Drive a car °-Operate machinery °-Drink alcoholic beverages °-Take any medication unless instructed by your physician °-Make any legal decisions or sign important papers. ° °Meals: °Start with liquid foods such as gelatin or soup. Progress to regular foods as tolerated. Avoid greasy, spicy, heavy foods. If nausea and/or vomiting occur, drink only clear liquids until the nausea and/or vomiting subsides. Call your physician if vomiting continues. ° °Special Instructions/Symptoms: °Your throat may feel dry or sore from the anesthesia or the breathing tube placed in your throat during surgery. If this causes discomfort, gargle   warm salt water. The discomfort should disappear within 24 hours.  If you had a scopolamine patch placed behind your ear for the management of post- operative nausea and/or vomiting:  1. The medication in the patch is effective for 72 hours, after which it should be removed.  Wrap patch in a tissue and discard in  the trash. Wash hands thoroughly with soap and water. 2. You may remove the patch earlier than 72 hours if you experience unpleasant side effects which may include dry mouth, dizziness or visual disturbances. 3. Avoid touching the patch. Wash your hands with soap and water after contact with the pat

## 2016-11-28 NOTE — Anesthesia Procedure Notes (Signed)
Procedure Name: LMA Insertion Date/Time: 11/28/2016 1:16 PM Performed by: Tyrone NineSAUVE, Annita Ratliff F Pre-anesthesia Checklist: Patient identified, Timeout performed, Emergency Drugs available, Suction available and Patient being monitored Patient Re-evaluated:Patient Re-evaluated prior to inductionOxygen Delivery Method: Circle system utilized Preoxygenation: Pre-oxygenation with 100% oxygen Intubation Type: IV induction Ventilation: Mask ventilation without difficulty LMA: LMA inserted LMA Size: 4.0 Number of attempts: 1 Placement Confirmation: breath sounds checked- equal and bilateral and positive ETCO2 Tube secured with: Tape Dental Injury: Teeth and Oropharynx as per pre-operative assessment

## 2016-11-28 NOTE — Anesthesia Preprocedure Evaluation (Addendum)
Anesthesia Evaluation  Patient identified by MRN, date of birth, ID band Patient awake    Reviewed: Allergy & Precautions, NPO status , Patient's Chart, lab work & pertinent test results  Airway Mallampati: II  TM Distance: <3 FB Neck ROM: Full    Dental  (+) Teeth Intact, Dental Advisory Given   Pulmonary asthma , former smoker,    breath sounds clear to auscultation       Cardiovascular negative cardio ROS   Rhythm:Regular Rate:Normal     Neuro/Psych  Headaches,    GI/Hepatic Neg liver ROS, hiatal hernia, GERD  Medicated and Controlled,  Endo/Other    Renal/GU      Musculoskeletal negative musculoskeletal ROS (+) Arthritis , Fibromyalgia -  Abdominal (+) + obese,   Peds  Hematology negative hematology ROS (+)   Anesthesia Other Findings   Reproductive/Obstetrics                           Anesthesia Physical Anesthesia Plan  ASA: III  Anesthesia Plan: General   Post-op Pain Management:    Induction: Intravenous  PONV Risk Score and Plan: Ondansetron and Dexamethasone  Airway Management Planned: LMA  Additional Equipment:   Intra-op Plan:   Post-operative Plan:   Informed Consent: I have reviewed the patients History and Physical, chart, labs and discussed the procedure including the risks, benefits and alternatives for the proposed anesthesia with the patient or authorized representative who has indicated his/her understanding and acceptance.   Dental advisory given  Plan Discussed with: CRNA and Anesthesiologist  Anesthesia Plan Comments:         Anesthesia Quick Evaluation

## 2016-11-28 NOTE — Transfer of Care (Signed)
Immediate Anesthesia Transfer of Care Note  Patient: Mallory [name]  Procedure(s) Performed: Procedure(s): DILATATION & CURETTAGE/HYSTEROSCOPY WITH MYOSURE (N/A)  Patient Location: PACU  Anesthesia Type:General  Level of Consciousness: awake, alert , oriented and patient cooperative  Airway & Oxygen Therapy: Patient Spontanous Breathing and Patient connected to nasal cannula oxygen  Post-op Assessment: Report given to RN and Post -op Vital signs reviewed and stable  Post vital signs: Reviewed and stable  Last Vitals:  Vitals:   11/28/16 1141  BP: 130/67  Pulse: 82  Resp: 16  Temp: 36.3 C    Last Pain:  Vitals:   11/28/16 1141  TempSrc: Oral      Patients Stated Pain Goal: 8 (11/28/16 1141)  Complications: No apparent anesthesia complications

## 2016-11-28 NOTE — H&P (Signed)
Mallory [name] is an 51 y.o. female. G2P2  RP:  HSC Myosure resection/D+C for Hyperplasia without atypia and possible Endometrial Polyp on EBx  HPI: Endometrial Bx done 10/23/2016:  Hyperplasia without atypia, suggestive of Endometrial Polyp.  Started on Norethindrone.  Ca125 normal.  Done for small Ovarian Cysts on Pelvic US and mother with h/o Ovarian Ca.   Pertinent Gynecological History: No previous Gyn surgery Blood transfusions: none OB History: G2P2   Menstrual History:  Patient's last menstrual period was 08/07/2016 (approximate).    Past Medical History:  Diagnosis Date  . Anxiety and depression   . Arthritis    knees  . Endometrial hyperplasia   . Environmental and seasonal allergies   . Fibromyalgia dx 1997  . GERD (gastroesophageal reflux disease)   . Headache(784.0)   . Hiatal hernia   . History of anal fissures   . History of Helicobacter pylori infection 2012  . Hyperlipidemia 2002  . IBS (irritable bowel syndrome) 2008   improved over last couple of years using Align  . Internal and external hemorrhoids without complication   . Interstitial cystitis 2006   followed by Dr Marcine MatarStephen Dahlstedt  . Mild intermittent asthma    allegy triggered  . Personal history of kidney stones 1986   passed spontaneous  . Pulmonary nodule 09/14/2008   follow up CT 2012 - no further eval advised  . SUI (stress urinary incontinence, female)   . Varicose veins   . Wears glasses     Past Surgical History:  Procedure Laterality Date  . CARDIOVASCULAR STRESS TEST  08/15/2011   normal nuclear study w/ no ischemia/  normal LV function and wall motion , ef 72%  . CHOLECYSTECTOMY N/A 10/28/2012   Procedure: LAPAROSCOPIC CHOLECYSTECTOMY WITH INTRAOPERATIVE CHOLANGIOGRAM;  Surgeon: Almond LintFaera Byerly, MD;  Location: [facility];  Service: General;  Laterality: N/A;  . COLONOSCOPY  2009 approx.  . ESOPHAGOGASTRODUODENOSCOPY  10-08-2016    [facility]  . TONSILLECTOMY  age 89    Family History   Problem Relation Age of Onset  . Diabetes Mother   . Hyperlipidemia Mother   . Hypertension Mother   . Breast cancer Mother [id number]  . Ovarian cancer Mother [id number]  . Irritable bowel syndrome Mother   . Arthritis Paternal Grandmother   . Heart disease Paternal Grandmother   . Stroke Father 1971  . Migraines Sister   . Colon cancer Maternal Grandmother   . Lung cancer Maternal Grandfather   . Colon cancer Maternal Aunt     Social History:  reports that she quit smoking about 31 years ago. Her smoking use included Cigarettes. She has never used smokeless tobacco. She reports that she does not drink alcohol or use drugs.  Allergies:  Allergies  Allergen Reactions  . Cymbalta [Duloxetine Hcl] Nausea Only    Nausea & Fatigue  . Simvastatin     Muscle ache in legs  . Venlafaxine     REACTION: bad dreams    Prescriptions Prior to Admission  Medication Sig Dispense Refill Last Dose  . ALPRAZolam (XANAX) 0.5 MG tablet Take 1/2-1 tablet by mouth three times daily as needed for anxiety. 45 tablet 1 Past Month at Unknown time  . Cholecalciferol (VITAMIN D3) 5000 units CAPS Take 1 capsule by mouth every evening.   11/27/2016 at Unknown time  . doxepin (SINEQUAN) 10 MG capsule Take 10 mg by mouth every other day. TAKES AT BEDTIME   11/26/2016  . fluticasone (FLONASE) 50 MCG/ACT nasal spray USE  2 SPRAYS IN EACH NOSTRIL DAILY (Patient taking differently: USE 2 SPRAYS IN EACH NOSTRIL DAILY--- TAKES IN AM) 48 g 0 11/28/2016 at 0700  . hyoscyamine (LEVBID) 0.375 MG 12 hr tablet Take 0.375 mg by mouth 2 (two) times daily.    11/27/2016 at Unknown time  . levocetirizine (XYZAL) 5 MG tablet TAKE 1 TABLET BY MOUTH EVERY EVENING 90 tablet 0 11/27/2016 at Unknown time  . norethindrone (AYGESTIN) 5 MG tablet Take 2 tabs po q d until surgery. (Patient taking differently: Take 5 mg by mouth daily. Take 2 tabs po q d until surgery. NOW TAKING ONE TABLET IN PM UNTIL SURGERY 11-28-2016) 50 tablet 0 11/27/2016 at Unknown time  .  pantoprazole (PROTONIX) 40 MG tablet TAKE 1 TABLET (40 MG TOTAL) BY MOUTH 2 (TWO) TIMES DAILY. 180 tablet 0 11/28/2016 at 0700  . ranitidine (ZANTAC) 300 MG tablet Take 1 tablet (300 mg total) by mouth at bedtime. 90 tablet 3 11/27/2016 at Unknown time  . sertraline (ZOLOFT) 50 MG tablet Take 50 mg by mouth every morning.    11/28/2016 at 0700  . Zinc 50 MG CAPS Take 1 capsule by mouth every evening.   11/27/2016 at Unknown time  . acyclovir (ZOVIRAX) 400 MG tablet Take 1 tablet (400 mg total) by mouth 5 (five) times daily. For episodic treatment of cold sores (Patient taking differently: Take 400 mg by mouth 5 (five) times daily as needed. For episodic treatment of cold sores) 20 tablet 3 Unknown at Unknown time  . albuterol (PROVENTIL HFA;VENTOLIN HFA) 108 (90 BASE) MCG/ACT inhaler Inhale 2 puffs into the lungs every 6 (six) hours as needed for wheezing. 1 Inhaler 2 More than a month at Unknown time    ROS Neg  Blood pressure 130/67, pulse 82, temperature 97.3 F (36.3 C), temperature source Oral, resp. rate 16, height 5' 11.5" (1.816 m), weight 257 lb 5 oz (116.7 kg), last menstrual period 08/07/2016, SpO2 98 %. Physical Exam   See Office notes  Recent Colpo: Atypical glandular cells of undetermined significance (AGUS) on cervical Pap smear  HPV HR neg.  No Dysplasia on Cervical Bx.  Assessment/Plan:  Endometrial hyperplasia without atypia, simple EBx Hyperplasia without atypia and suggestive of Endometrial Polyp.  Decision to proceed with HSC/Resection/D+C.  Surgery and risks reviewed including trauma, infection, hemorrhage.  Pamphlet given.  Mallory [name] 11/28/2016, 12:05 PM

## 2016-11-29 ENCOUNTER — Encounter (HOSPITAL_BASED_OUTPATIENT_CLINIC_OR_DEPARTMENT_OTHER): Payer: Self-pay | Admitting: Obstetrics & Gynecology

## 2016-11-29 NOTE — Anesthesia Postprocedure Evaluation (Signed)
Anesthesia Post Note  Patient: Mallory [name]  Procedure(s) Performed: Procedure(s) (LRB): DILATATION & CURETTAGE/HYSTEROSCOPY WITH MYOSURE (N/A)     Patient location during evaluation: PACU Anesthesia Type: General Level of consciousness: awake and alert Pain management: pain level controlled Vital Signs Assessment: post-procedure vital signs reviewed and stable Respiratory status: spontaneous breathing, nonlabored ventilation, respiratory function stable and patient connected to nasal cannula oxygen Cardiovascular status: blood pressure returned to baseline and stable Postop Assessment: no signs of nausea or vomiting Anesthetic complications: no    Last Vitals:  Vitals:   11/28/16 1445 11/28/16 1517  BP: 125/67 118/63  Pulse: 77 79  Resp: 19 20  Temp:  36.8 C    Last Pain:  Vitals:   11/28/16 1500  TempSrc:   PainSc: 3                  Shelton SilvasKevin D Hollis

## 2016-12-02 ENCOUNTER — Encounter: Payer: Self-pay | Admitting: Obstetrics & Gynecology

## 2016-12-09 ENCOUNTER — Other Ambulatory Visit: Payer: Self-pay | Admitting: Obstetrics & Gynecology

## 2016-12-09 DIAGNOSIS — N951 Menopausal and female climacteric states: Secondary | ICD-10-CM

## 2016-12-10 ENCOUNTER — Encounter: Payer: Self-pay | Admitting: Obstetrics & Gynecology

## 2016-12-10 ENCOUNTER — Other Ambulatory Visit: Payer: 59

## 2016-12-10 ENCOUNTER — Other Ambulatory Visit: Payer: Self-pay | Admitting: Obstetrics & Gynecology

## 2016-12-10 DIAGNOSIS — N951 Menopausal and female climacteric states: Secondary | ICD-10-CM

## 2016-12-10 LAB — TSH: TSH: 0.33 mIU/L — ABNORMAL LOW

## 2016-12-11 LAB — T3, FREE: T3, Free: 3.2 pg/mL (ref 2.3–4.2)

## 2016-12-11 LAB — T4, FREE: FREE T4: 1.3 ng/dL (ref 0.8–1.8)

## 2016-12-11 LAB — FOLLICLE STIMULATING HORMONE: FSH: 64.5 m[IU]/mL

## 2016-12-15 LAB — THYROID STIMULATING IMMUNOGLOBULIN: TSI: <89 % baseline (ref <140)

## 2016-12-16 ENCOUNTER — Other Ambulatory Visit: Payer: Self-pay | Admitting: Obstetrics & Gynecology

## 2016-12-16 ENCOUNTER — Telehealth: Payer: Self-pay

## 2016-12-16 DIAGNOSIS — R7989 Other specified abnormal findings of blood chemistry: Secondary | ICD-10-CM

## 2016-12-16 NOTE — Telephone Encounter (Signed)
Patient had low TSH and you ordered additional labs.  These results are on your desk.  Please advise what to tell patient about results.

## 2016-12-16 NOTE — Telephone Encounter (Signed)
Patient informed. Recall placed for six mos. Order placed.

## 2016-12-16 NOTE — Telephone Encounter (Signed)
Of note, I informed her by leaving detailed message in voice mail per [facility] on file.

## 2016-12-16 NOTE — Telephone Encounter (Signed)
FT4, FT3 and TSI all normal.  Reassure patient.  Repeat full Thyroid panel in 6 months.

## 2016-12-19 ENCOUNTER — Encounter: Payer: Self-pay | Admitting: Obstetrics & Gynecology

## 2016-12-19 ENCOUNTER — Ambulatory Visit (INDEPENDENT_AMBULATORY_CARE_PROVIDER_SITE_OTHER): Payer: 59 | Admitting: Obstetrics & Gynecology

## 2016-12-19 VITALS — BP 130/86

## 2016-12-19 DIAGNOSIS — B373 Candidiasis of vulva and vagina: Secondary | ICD-10-CM

## 2016-12-19 DIAGNOSIS — Z09 Encounter for follow-up examination after completed treatment for conditions other than malignant neoplasm: Secondary | ICD-10-CM | POA: Diagnosis not present

## 2016-12-19 DIAGNOSIS — R350 Frequency of micturition: Secondary | ICD-10-CM | POA: Diagnosis not present

## 2016-12-19 DIAGNOSIS — B3731 Acute candidiasis of vulva and vagina: Secondary | ICD-10-CM

## 2016-12-19 LAB — URINALYSIS W MICROSCOPIC + REFLEX CULTURE
BILIRUBIN URINE: NEGATIVE
CRYSTALS: NONE SEEN [HPF]
Casts: NONE SEEN [LPF]
Hgb urine dipstick: NEGATIVE
NITRITE: NEGATIVE
RBC / HPF: NONE SEEN RBC/HPF (ref <=2)
Yeast: NONE SEEN [HPF]
pH: 5.5 (ref 5.0–8.0)

## 2016-12-19 MED ORDER — FLUCONAZOLE 150 MG PO TABS: 150.0000 mg | ORAL_TABLET | Freq: Every day | ORAL | 1 refills | Status: AC

## 2016-12-19 NOTE — Patient Instructions (Signed)
1. Status post gynecological surgery, follow-up exam Good postop evolution, no Cx.  Patho:  Benign endometrium, benign polyp.  Results discussed with patient, reassured.  2. Vulvovaginitis due to yeast Fluconazole prescribed.  Information given.  3. Urinary frequency R/O Cystitis.  U/A reflex done.  H/O Interstitial Cystitis.  Jessa, good to see you today!  Your thyroid function is normal, but the TSH was on the low side, so I recommend repeating the Thyroid tests in 6 months.

## 2016-12-19 NOTE — Addendum Note (Signed)
Addended by: Berna SpareASTILLO, Romilda Proby A on: 12/19/2016 12:23 PM   Modules accepted: Orders

## 2016-12-19 NOTE — Progress Notes (Signed)
    Mallory [name] 01/02/1966 [id number]        51 y.o.  [id number]   RP:  Postop HSC/Resection/D+C, Vaginal itching/Urinary frequency, Low TSH  HPI:  Good postop evolution.  No pelvic pain.  No vaginal bleeding.  C/O vulvar and vaginal itching as well as urinary frequency and difficulty initiating flow.  No fever.  TSH low at 0.33 on 12/10/2016.  Past medical history,surgical history, problem list, medications, allergies, family history and social history were all reviewed and documented in the EPIC chart.  Directed ROS with pertinent positives and negatives documented in the history of present illness/assessment and plan.  Exam:  Vitals:   12/19/16 0907  BP: 130/86   General appearance:  Normal  CVAT neg bilaterally  Gyn exam:  Vulva normal except for secretions c/w yeast.                    Speculum:  Cervix normal.  Increased vaginal d/c.                    Bimanual:  Uterus AV, normal volume, NT.  No adnexal mass.  FT4 1.3 normal FT3 3.2 normal TSI <89 normal  Assessment/Plan:  51 y.o. G2P2002  1. Status post gynecological surgery, follow-up exam Good postop evolution, no Cx.  Patho:  Benign endometrium, benign polyp.  Results discussed with patient, reassured.  2. Vulvovaginitis due to yeast Fluconazole prescribed.  Information given.  3. Urinary frequency R/O Cystitis.  U/A reflex done.  H/O Interstitial Cystitis.  Counseling on above issues >50% x 15 minutes.  [name] MD, 9:21 AM 12/19/2016

## 2016-12-20 LAB — URINE CULTURE

## 2017-06-09 ENCOUNTER — Encounter: Payer: Self-pay | Admitting: Obstetrics & Gynecology

## 2017-06-12 ENCOUNTER — Encounter: Payer: Self-pay | Admitting: Obstetrics & Gynecology

## 2017-06-12 ENCOUNTER — Other Ambulatory Visit: Payer: 59

## 2017-06-13 ENCOUNTER — Other Ambulatory Visit: Payer: Self-pay | Admitting: Obstetrics & Gynecology

## 2017-06-13 DIAGNOSIS — N939 Abnormal uterine and vaginal bleeding, unspecified: Secondary | ICD-10-CM

## 2017-06-13 MED ORDER — NORETHINDRONE ACETATE 5 MG PO TABS: ORAL_TABLET | ORAL | 0 refills | Status: DC

## 2017-06-18 ENCOUNTER — Other Ambulatory Visit: Payer: 59

## 2017-06-19 ENCOUNTER — Other Ambulatory Visit: Payer: 59

## 2017-06-23 ENCOUNTER — Other Ambulatory Visit: Payer: Self-pay

## 2017-06-24 HISTORY — PX: HEMORRHOID BANDING: SHX5850

## 2017-06-25 ENCOUNTER — Other Ambulatory Visit: Payer: 59

## 2017-06-25 ENCOUNTER — Other Ambulatory Visit: Payer: Self-pay | Admitting: Anesthesiology

## 2017-06-25 DIAGNOSIS — R946 Abnormal results of thyroid function studies: Secondary | ICD-10-CM

## 2017-06-25 NOTE — Progress Notes (Signed)
th

## 2017-06-26 LAB — THYROID PANEL WITH TSH
Free Thyroxine Index: 2.4 (ref 1.4–3.8)
T3 Uptake: 30 % (ref 22–35)
T4, Total: 8.1 ug/dL (ref 5.1–11.9)
TSH: 1.02 mIU/L

## 2017-06-27 ENCOUNTER — Telehealth: Payer: Self-pay | Admitting: *Deleted

## 2017-06-27 NOTE — Telephone Encounter (Signed)
Pt called asking if full bladder needed for ultrasound scheduled on 06/30/17. I called pt and told her no full bladder needed.

## 2017-06-30 ENCOUNTER — Encounter: Payer: Self-pay | Admitting: Obstetrics & Gynecology

## 2017-06-30 ENCOUNTER — Ambulatory Visit: Payer: 59 | Admitting: Obstetrics & Gynecology

## 2017-06-30 ENCOUNTER — Ambulatory Visit (INDEPENDENT_AMBULATORY_CARE_PROVIDER_SITE_OTHER): Payer: 59

## 2017-06-30 VITALS — BP 102/66

## 2017-06-30 DIAGNOSIS — N939 Abnormal uterine and vaginal bleeding, unspecified: Secondary | ICD-10-CM | POA: Diagnosis not present

## 2017-06-30 DIAGNOSIS — N921 Excessive and frequent menstruation with irregular cycle: Secondary | ICD-10-CM

## 2017-06-30 NOTE — Progress Notes (Signed)
    Mallory [name] 01/01/1966 [id number]        52 y.o.  G2P2002   RP:  BTB on Progestin-only pill  HPI: Patient had breakthrough bleeding on progestin only pill.  She added Aygestin 1-2 tablet of 5 mg daily for 10 days.  That dosage control her her bleeding and she is now doing well without vaginal bleeding.  No pelvic pain.  No vaginal discharge.  No fever.  Patient has a history of hysteroscopy with resection of endometrial polyp 11/2016.  Past medical history,surgical history, problem list, medications, allergies, family history and social history were all reviewed and documented in the EPIC chart.  Directed ROS with pertinent positives and negatives documented in the history of present illness/assessment and plan.  Exam:  Vitals:   06/30/17 1258  BP: 102/66   General appearance:  Normal  Pelvic US today: T/V and T/A retroverted uterus measuring 7.5 x 5.17 x 4.87 cm.  Endometrial lining at 3.1 mm.  Right ovary normal with a small follicle measuring 1.0 x 0.7 cm.  Left ovary normal.  No free fluid in the posterior cul-de-sac.   Assessment/Plan:  52 y.o. G2P2002   1. Breakthrough bleeding on birth control pills Pelvic ultrasound normal with endometrial lining at 3.1 mm.  No evidence of intrauterine lesion.  Pelvic ultrasound also showing normal ovaries.  Patient reassured.  Will continue on progestin only pill.  Follow-up next annual gynecologic exam.  Counseling on above issues more than 50% for 15 minutes.  Genia DelMarie-Lyne Sapphira Harjo MD, 1:20 PM 06/30/2017

## 2017-07-02 ENCOUNTER — Encounter: Payer: Self-pay | Admitting: Obstetrics & Gynecology

## 2017-07-02 ENCOUNTER — Encounter: Payer: Self-pay | Admitting: *Deleted

## 2017-07-02 MED ORDER — NORETHINDRONE ACETATE 5 MG PO TABS: ORAL_TABLET | ORAL | 1 refills | Status: DC

## 2017-07-02 NOTE — Patient Instructions (Signed)
  1. Breakthrough bleeding on birth control pills Pelvic ultrasound normal with endometrial lining at 3.1 mm.  No evidence of intrauterine lesion.  Pelvic ultrasound also showing normal ovaries.  Patient reassured.  Will continue on progestin only pill.  Follow-up next annual gynecologic exam.  Dietrich, it was a pleasure seeing you today!

## 2017-07-16 ENCOUNTER — Other Ambulatory Visit: Payer: 59

## 2017-07-16 ENCOUNTER — Ambulatory Visit: Payer: 59 | Admitting: Obstetrics & Gynecology

## 2018-01-06 ENCOUNTER — Encounter: Payer: Self-pay | Admitting: Obstetrics & Gynecology

## 2018-01-06 ENCOUNTER — Other Ambulatory Visit: Payer: Self-pay

## 2018-01-06 MED ORDER — NORETHINDRONE 0.35 MG PO TABS: 1.0000 | ORAL_TABLET | Freq: Every day | ORAL | 2 refills | Status: DC

## 2018-03-23 ENCOUNTER — Other Ambulatory Visit: Payer: Self-pay | Admitting: Obstetrics & Gynecology

## 2018-03-23 NOTE — Telephone Encounter (Signed)
CE scheduled 06/04/18  (r/s 04/20/18). I put a note on Rx that she must keep her CE appointment.   Last CE 10/18/2016.

## 2018-03-26 ENCOUNTER — Encounter: Payer: Self-pay | Admitting: Obstetrics & Gynecology

## 2018-03-26 ENCOUNTER — Ambulatory Visit: Payer: BLUE CROSS/BLUE SHIELD | Admitting: Obstetrics & Gynecology

## 2018-03-26 VITALS — BP 128/78 | Ht 72.0 in | Wt 269.0 lb

## 2018-03-26 DIAGNOSIS — Z8041 Family history of malignant neoplasm of ovary: Secondary | ICD-10-CM | POA: Diagnosis not present

## 2018-03-26 DIAGNOSIS — Z01419 Encounter for gynecological examination (general) (routine) without abnormal findings: Secondary | ICD-10-CM | POA: Diagnosis not present

## 2018-03-26 DIAGNOSIS — Z3041 Encounter for surveillance of contraceptive pills: Secondary | ICD-10-CM | POA: Diagnosis not present

## 2018-03-26 DIAGNOSIS — Z23 Encounter for immunization: Secondary | ICD-10-CM | POA: Diagnosis not present

## 2018-03-26 DIAGNOSIS — E6609 Other obesity due to excess calories: Secondary | ICD-10-CM | POA: Diagnosis not present

## 2018-03-26 DIAGNOSIS — Z6836 Body mass index (BMI) 36.0-36.9, adult: Secondary | ICD-10-CM

## 2018-03-26 MED ORDER — NORETHINDRONE 0.35 MG PO TABS: 1.0000 | ORAL_TABLET | Freq: Every day | ORAL | 4 refills | Status: DC

## 2018-03-26 NOTE — Progress Notes (Signed)
Mallory [name] Jul 23, 1965 [id number]   History:    52 y.o. G2P2L2  RP:  Established patient presenting for annual gyn exam   HPI: Well on progestin only birth control pill.  No abnormal bleeding.  No pelvic pain.  No pain with intercourse.  Urine and bowel movements normal.  Breasts normal.  Mother deceased of an ovarian cancer at age 65 (ovarian cancer diagnosed at 51).  Body mass index 36.48.  Exercising regularly.  Health labs with family physician.  Past medical history,surgical history, family history and social history were all reviewed and documented in the EPIC chart.  Gynecologic History No LMP recorded. (Menstrual status: Perimenopausal). Contraception: oral progesterone-only contraceptive Last Pap: 09/2016. Results were: AGUS.  Colpo:  ECC neg/Cervical Bx no dysplasia Last mammogram: May 2018. Results were: Negative Bone Density: Never Colonoscopy: 2009.  Will schedule this year.  Obstetric History OB History  Gravida Para Term Preterm AB Living  2 2 2     2   SAB TAB Ectopic Multiple Live Births          2    # Outcome Date GA Lbr Len/2nd Weight Sex Delivery Anes PTL Lv  2 Term 02/05/88 [redacted]w[redacted]d  7 lb 6 oz (3.345 kg) F Vag-Spont   LIV  1 Term 04/17/84 [redacted]w[redacted]d  7 lb 6 oz (3.345 kg) M Vag-Spont   LIV     ROS: A ROS was performed and pertinent positives and negatives are included in the history.  GENERAL: No fevers or chills. HEENT: No change in vision, no earache, sore throat or sinus congestion. NECK: No pain or stiffness. CARDIOVASCULAR: No chest pain or pressure. No palpitations. PULMONARY: No shortness of breath, cough or wheeze. GASTROINTESTINAL: No abdominal pain, nausea, vomiting or diarrhea, melena or bright red blood per rectum. GENITOURINARY: No urinary frequency, urgency, hesitancy or dysuria. MUSCULOSKELETAL: No joint or muscle pain, no back pain, no recent trauma. DERMATOLOGIC: No rash, no itching, no lesions. ENDOCRINE: No polyuria, polydipsia, no heat or cold  intolerance. No recent change in weight. HEMATOLOGICAL: No anemia or easy bruising or bleeding. NEUROLOGIC: No headache, seizures, numbness, tingling or weakness. PSYCHIATRIC: No depression, no loss of interest in normal activity or change in sleep pattern.     Exam:   BP 128/78   Ht 6' (1.829 m)   Wt 269 lb (122 kg)   BMI 36.48 kg/m   Body mass index is 36.48 kg/m.  General appearance : Well developed well nourished female. No acute distress HEENT: Eyes: no retinal hemorrhage or exudates,  Neck supple, trachea midline, no carotid bruits, no thyroidmegaly Lungs: Clear to auscultation, no rhonchi or wheezes, or rib retractions  Heart: Regular rate and rhythm, no murmurs or gallops Breast:Examined in sitting and supine position were symmetrical in appearance, no palpable masses or tenderness,  no skin retraction, no nipple inversion, no nipple discharge, no skin discoloration, no axillary or supraclavicular lymphadenopathy Abdomen: no palpable masses or tenderness, no rebound or guarding Extremities: no edema or skin discoloration or tenderness  Pelvic: Vulva: Normal             Vagina: No gross lesions or discharge  Cervix: No gross lesions or discharge.  Pap reflex done  Uterus  AV, normal size, shape and consistency, non-tender and mobile  Adnexa  Without masses or tenderness  Anus: Normal   Assessment/Plan:  52 y.o. female for annual exam   1. Encounter for routine gynecological examination with Papanicolaou smear of cervix Normal gynecologic exam.  Pap reflex done.  Breast exam normal.  Will schedule screening mammogram now.  Health labs with family physician.  Schedule screening colonoscopy this year.  2. Encounter for surveillance of contraceptive pills Continue with the progestin only birth control pill.  No contraindication.  Protection against ovarian cancer.  3. Family history of ovarian cancer Mother diagnosed with ovarian cancer at age 60 and deceased at age 25.   Patient will follow-up for pelvic ultrasound for screening. - US Transvaginal Non-OB; Future  4. Class 2 obesity due to excess calories without serious comorbidity with body mass index (BMI) of 36.0 to 36.9 in adult Recommend low calorie/low carb diet such as [employer].  Physical activity with aerobic activities 5 times a week and weightlifting every 2 days.  5. Flu vaccine need Flu shot given.  Genia Del MD, 3:14 PM 03/26/2018

## 2018-03-29 ENCOUNTER — Encounter: Payer: Self-pay | Admitting: Obstetrics & Gynecology

## 2018-03-29 NOTE — Patient Instructions (Signed)
1. Encounter for routine gynecological examination with Papanicolaou smear of cervix Normal gynecologic exam.  Pap reflex done.  Breast exam normal.  Will schedule screening mammogram now.  Health labs with family physician.  Schedule screening colonoscopy this year.  2. Encounter for surveillance of contraceptive pills Continue with the progestin only birth control pill.  No contraindication.  Protection against ovarian cancer.  3. Family history of ovarian cancer Mother diagnosed with ovarian cancer at age 53 and deceased at age 22.  Patient will follow-up for pelvic ultrasound for screening. - US Transvaginal Non-OB; Future  4. Class 2 obesity due to excess calories without serious comorbidity with body mass index (BMI) of 36.0 to 36.9 in adult Recommend low calorie/low carb diet such as [employer].  Physical activity with aerobic activities 5 times a week and weightlifting every 2 days.  5. Flu vaccine need Flu shot given.  Camelia, it was a pleasure seeing you today!  I will inform you of your results as soon as they are available

## 2018-03-31 LAB — PAP IG W/ RFLX HPV ASCU

## 2018-04-20 ENCOUNTER — Encounter: Payer: 59 | Admitting: Obstetrics & Gynecology

## 2018-05-05 ENCOUNTER — Other Ambulatory Visit: Payer: Self-pay | Admitting: Obstetrics & Gynecology

## 2018-05-05 ENCOUNTER — Ambulatory Visit: Payer: BLUE CROSS/BLUE SHIELD | Admitting: Obstetrics & Gynecology

## 2018-05-05 ENCOUNTER — Ambulatory Visit (INDEPENDENT_AMBULATORY_CARE_PROVIDER_SITE_OTHER): Payer: BLUE CROSS/BLUE SHIELD

## 2018-05-05 ENCOUNTER — Encounter: Payer: Self-pay | Admitting: Obstetrics & Gynecology

## 2018-05-05 VITALS — BP 134/86

## 2018-05-05 DIAGNOSIS — K5904 Chronic idiopathic constipation: Secondary | ICD-10-CM | POA: Diagnosis not present

## 2018-05-05 DIAGNOSIS — Z8041 Family history of malignant neoplasm of ovary: Secondary | ICD-10-CM

## 2018-05-05 DIAGNOSIS — R102 Pelvic and perineal pain: Secondary | ICD-10-CM | POA: Diagnosis not present

## 2018-05-05 DIAGNOSIS — N83201 Unspecified ovarian cyst, right side: Secondary | ICD-10-CM

## 2018-05-05 DIAGNOSIS — R140 Abdominal distension (gaseous): Secondary | ICD-10-CM

## 2018-05-05 DIAGNOSIS — N83202 Unspecified ovarian cyst, left side: Secondary | ICD-10-CM

## 2018-05-05 DIAGNOSIS — N921 Excessive and frequent menstruation with irregular cycle: Secondary | ICD-10-CM | POA: Diagnosis not present

## 2018-05-05 NOTE — Progress Notes (Signed)
    Mallory [name] 01/30/1966 [id number]        52 y.o.  G2P2002 married  RP: Metrorrhagia/Bloating and mother with history of ovarian cancer for Pelvic US  HPI: Perimenopausal with light metrorrhagia.  Bloating probably secondary to constipation.  Mother with history of ovarian cancer after 7760.   OB History  Gravida Para Term Preterm AB Living  2 2 2     2   SAB TAB Ectopic Multiple Live Births          2    # Outcome Date GA Lbr Len/2nd Weight Sex Delivery Anes PTL Lv  2 Term 02/05/88 1778w0d  7 lb 6 oz (3.345 kg) F Vag-Spont   LIV  1 Term 04/17/84 178w0d  7 lb 6 oz (3.345 kg) M Vag-Spont   LIV    Past medical history,surgical history, problem list, medications, allergies, family history and social history were all reviewed and documented in the EPIC chart.   Directed ROS with pertinent positives and negatives documented in the history of present illness/assessment and plan.  Exam:  Vitals:   05/05/18 1123  BP: 134/86   General appearance:  Normal  Pelvic US today: T/V images.  Retroverted homogeneous uterus measuring 6.72 x 4.84 x 4.02 cm.  Normal endometrial line at 3.6 mm.  Right ovary with a simple echo-free functional cyst measuring 1.1 x 1.9 cm.  Left ovary with a small echo-free functional cyst measuring 0.9 cm.  Questionable left ovarian focal area measuring 2.2 x 1.9 cm on the sagittal image but unable to identify this area on transverse images, possible bowel with stool producing this image.  No free fluid in the posterior cul-de-sac.   Assessment/Plan:  52 y.o. G2P2002   1. Metrorrhagia Thin endometrial line at 3.6 mm.  Patient reassured.  2. Pelvic pain in female Very small ovarian follicles on the right ovary.  Questionable left ovarian focal area measuring 2.2 cm versus possibly an image produced by bowel with stools.  Will repeat a pelvic ultrasound to reevaluate, after treating constipation, in about 8 weeks.  Ultrasound findings thoroughly reviewed with  patient. - US Transvaginal Non-OB; Future  3. Chronic idiopathic constipation Counseling on management of constipation.  Continue with high-fiber nutrition with plenty of water.  Favor grapes and prunes.  Avoid bananas.  Stool softener as needed.  Counseling on above issues and coordination of care more than 50% for 15 minutes.  Genia DelMarie-Lyne Emmaus Brandi MD, 12:02 PM 05/05/2018

## 2018-05-10 ENCOUNTER — Encounter: Payer: Self-pay | Admitting: Obstetrics & Gynecology

## 2018-05-10 NOTE — Patient Instructions (Signed)
1. Metrorrhagia Thin endometrial line at 3.6 mm.  Patient reassured.  2. Pelvic pain in female Very small ovarian follicles on the right ovary.  Questionable left ovarian focal area measuring 2.2 cm versus possibly an image produced by bowel with stools.  Will repeat a pelvic ultrasound to reevaluate, after treating constipation, in about 8 weeks.  Ultrasound findings thoroughly reviewed with patient. - US Transvaginal Non-OB; Future  3. Chronic idiopathic constipation Counseling on management of constipation.  Continue with high-fiber nutrition with plenty of water.  Favor grapes and prunes.  Avoid bananas.  Stool softener as needed.  Mallory [name], it was a pleasure seeing you today!

## 2018-06-04 ENCOUNTER — Encounter: Payer: 59 | Admitting: Obstetrics & Gynecology

## 2018-07-07 ENCOUNTER — Ambulatory Visit: Payer: BLUE CROSS/BLUE SHIELD | Admitting: Obstetrics & Gynecology

## 2018-07-07 ENCOUNTER — Other Ambulatory Visit: Payer: BLUE CROSS/BLUE SHIELD

## 2018-07-09 ENCOUNTER — Ambulatory Visit: Payer: BLUE CROSS/BLUE SHIELD | Admitting: Obstetrics & Gynecology

## 2018-07-09 ENCOUNTER — Ambulatory Visit (INDEPENDENT_AMBULATORY_CARE_PROVIDER_SITE_OTHER): Payer: BLUE CROSS/BLUE SHIELD

## 2018-07-09 ENCOUNTER — Encounter: Payer: Self-pay | Admitting: Obstetrics & Gynecology

## 2018-07-09 ENCOUNTER — Other Ambulatory Visit: Payer: BLUE CROSS/BLUE SHIELD

## 2018-07-09 ENCOUNTER — Other Ambulatory Visit: Payer: Self-pay | Admitting: Obstetrics & Gynecology

## 2018-07-09 VITALS — BP 112/80 | Ht 72.0 in | Wt 264.2 lb

## 2018-07-09 DIAGNOSIS — R102 Pelvic and perineal pain: Secondary | ICD-10-CM | POA: Diagnosis not present

## 2018-07-09 DIAGNOSIS — N921 Excessive and frequent menstruation with irregular cycle: Secondary | ICD-10-CM

## 2018-07-09 DIAGNOSIS — Z8041 Family history of malignant neoplasm of ovary: Secondary | ICD-10-CM | POA: Diagnosis not present

## 2018-07-09 DIAGNOSIS — N9489 Other specified conditions associated with female genital organs and menstrual cycle: Secondary | ICD-10-CM

## 2018-07-09 NOTE — Progress Notes (Signed)
    Mallory [name] June 17, 1966 [id number]        53 y.o.  G2P2002   RP: Left adnexal focus/mother with ovarian ca/metrorrhagia for pelvic US  HPI: Well on the Progesterone-only pill with mild BTB and postcoital spotting.  No pelvic pain.  Last Pelvic US left adnexal focal area 2.2 cm.  Mother with Ovarian Ca.   OB History  Gravida Para Term Preterm AB Living  2 2 2     2   SAB TAB Ectopic Multiple Live Births          2    # Outcome Date GA Lbr Len/2nd Weight Sex Delivery Anes PTL Lv  2 Term 02/05/88 [redacted]w[redacted]d  7 lb 6 oz (3.345 kg) F Vag-Spont   LIV  1 Term 04/17/84 [redacted]w[redacted]d  7 lb 6 oz (3.345 kg) M Vag-Spont   LIV    Past medical history,surgical history, problem list, medications, allergies, family history and social history were all reviewed and documented in the EPIC chart.   Directed ROS with pertinent positives and negatives documented in the history of present illness/assessment and plan.  Exam:  Vitals:   07/09/18 1146  BP: 112/80  Weight: 264 lb 3.2 oz (119.8 kg)  Height: 6' (1.829 m)   General appearance:  Normal  Pelvic US today: T/V images.  Small homogeneous retroverted uterus measuring 7.71 x 4.93 x 3.62 cm.  Normal thin endometrial line measured at 3.8 mm.  Both ovaries are small with atrophic appearance.  No right or left adnexal mass or cyst.  The previous questionable focal area in the left adnexa is no longer visible.  No free fluid in the posterior cul-de-sac.   Assessment/Plan:  53 y.o. G2P2002   1. Adnexal mass The previous questionable focal area in the left adnexa is no longer visible on ultrasound today.  Both ovaries are small with atrophic appearance and no right or left adnexal mass or cyst.  No free fluid in the posterior cul-de-sac.  Normal uterus with a thin endometrial lining.  Patient reassured.  Will follow-up for next annual gynecologic exam.  2. Breakthrough bleeding on birth control pills Normal thin endometrial lining at 3.8 mm.  Patient  reassured.  Will continue on the progestin only pill.  3. Family history of ovarian cancer Mother with ovarian cancer.  Patient reassured with pelvic ultrasound findings today revealing both ovaries being small and atrophic with no adnexal masses or cysts.  Patient reassured.  Counseling on above issues and coordination of care more than 50% for 15 minutes.  Genia Del MD, 12:18 PM 07/09/2018

## 2018-07-13 ENCOUNTER — Encounter: Payer: Self-pay | Admitting: Obstetrics & Gynecology

## 2018-07-13 NOTE — Patient Instructions (Signed)
1. Adnexal mass The previous questionable focal area in the left adnexa is no longer visible on ultrasound today.  Both ovaries are small with atrophic appearance and no right or left adnexal mass or cyst.  No free fluid in the posterior cul-de-sac.  Normal uterus with a thin endometrial lining.  Patient reassured.  Will follow-up for next annual gynecologic exam.  2. Breakthrough bleeding on birth control pills Normal thin endometrial lining at 3.8 mm.  Patient reassured.  Will continue on the progestin only pill.  3. Family history of ovarian cancer Mother with ovarian cancer.  Patient reassured with pelvic ultrasound findings today revealing both ovaries being small and atrophic with no adnexal masses or cysts.  Patient reassured.  Elisabetta, it was a pleasure seeing you today!

## 2019-05-24 ENCOUNTER — Other Ambulatory Visit: Payer: Self-pay | Admitting: Obstetrics & Gynecology

## 2019-07-30 ENCOUNTER — Other Ambulatory Visit: Payer: Self-pay | Admitting: Obstetrics & Gynecology

## 2019-11-26 ENCOUNTER — Telehealth: Payer: Self-pay | Admitting: *Deleted

## 2019-11-26 DIAGNOSIS — N951 Menopausal and female climacteric states: Secondary | ICD-10-CM

## 2019-11-26 NOTE — Telephone Encounter (Signed)
Dr.Lavoie patient thought you told her to stop the Micronor 0.35 mg tablets at some point and come back to have hormone level checked? FSH was checked on 12/10/16 and it confirmed elevated confirming menopause.  Patient also mentioned she had a couple of episodes of spotting. I didn't see anything is chart regarding this. Please advise

## 2019-11-26 NOTE — Telephone Encounter (Signed)
Patient called and left message in triage voicemail with questions regarding stopping micronor pills and checking FSH. I left message asking patient to call me.

## 2019-11-26 NOTE — Telephone Encounter (Signed)
Yes, I want her to confirm high FSH now (> 1 week after stopping Micronor) and if high, do not restart Micronor.

## 2019-11-26 NOTE — Telephone Encounter (Signed)
Patient informed. Order placed, patient aware to schedule lab appointment.

## 2020-01-24 ENCOUNTER — Other Ambulatory Visit: Payer: Self-pay | Admitting: Obstetrics & Gynecology

## 2020-02-02 ENCOUNTER — Ambulatory Visit (INDEPENDENT_AMBULATORY_CARE_PROVIDER_SITE_OTHER): Payer: BC Managed Care – PPO | Admitting: Obstetrics & Gynecology

## 2020-02-02 ENCOUNTER — Other Ambulatory Visit: Payer: Self-pay

## 2020-02-02 ENCOUNTER — Encounter: Payer: Self-pay | Admitting: Obstetrics & Gynecology

## 2020-02-02 VITALS — BP 128/76 | Ht 71.0 in | Wt 256.0 lb

## 2020-02-02 DIAGNOSIS — E6609 Other obesity due to excess calories: Secondary | ICD-10-CM | POA: Diagnosis not present

## 2020-02-02 DIAGNOSIS — N951 Menopausal and female climacteric states: Secondary | ICD-10-CM

## 2020-02-02 DIAGNOSIS — Z01411 Encounter for gynecological examination (general) (routine) with abnormal findings: Secondary | ICD-10-CM | POA: Diagnosis not present

## 2020-02-02 DIAGNOSIS — Z6835 Body mass index (BMI) 35.0-35.9, adult: Secondary | ICD-10-CM

## 2020-02-02 DIAGNOSIS — N930 Postcoital and contact bleeding: Secondary | ICD-10-CM | POA: Diagnosis not present

## 2020-02-02 DIAGNOSIS — N9089 Other specified noninflammatory disorders of vulva and perineum: Secondary | ICD-10-CM | POA: Diagnosis not present

## 2020-02-02 DIAGNOSIS — Z01419 Encounter for gynecological examination (general) (routine) without abnormal findings: Secondary | ICD-10-CM

## 2020-02-02 NOTE — Progress Notes (Signed)
Mallory [name] December 17, 1965 [id number]   History:    54 y.o. G2P2L2 Married  RP:  Established patient presenting for annual gyn exam   HPI: Well on progestin only birth control pill.  Stopped 2 weeks ago to get an [facility] drawn today.  Occasional hot flushes.  But recently feeling cold.  No withdrawal bleeding.  No vaginal bleeding x 2 yrs.  No pelvic pain.  No pain with intercourse, c/o postcoital bleeding.  Urine and bowel movements normal.  Breasts normal.  Mother deceased of an ovarian cancer at age 32 (ovarian cancer diagnosed at 68).  Body mass index 35.7.  Exercising regularly.  Health labs with family physician.   Past medical history,surgical history, family history and social history were all reviewed and documented in the EPIC chart.  Gynecologic History No LMP recorded. (Menstrual status: Perimenopausal).  Obstetric History OB History  Gravida Para Term Preterm AB Living  2 2 2     2   SAB TAB Ectopic Multiple Live Births          2    # Outcome Date GA Lbr Len/2nd Weight Sex Delivery Anes PTL Lv  2 Term 02/05/88 [redacted]w[redacted]d  7 lb 6 oz (3.345 kg) F Vag-Spont   LIV  1 Term 04/17/84 [redacted]w[redacted]d  7 lb 6 oz (3.345 kg) M Vag-Spont   LIV     ROS: A ROS was performed and pertinent positives and negatives are included in the history.  GENERAL: No fevers or chills. HEENT: No change in vision, no earache, sore throat or sinus congestion. NECK: No pain or stiffness. CARDIOVASCULAR: No chest pain or pressure. No palpitations. PULMONARY: No shortness of breath, cough or wheeze. GASTROINTESTINAL: No abdominal pain, nausea, vomiting or diarrhea, melena or bright red blood per rectum. GENITOURINARY: No urinary frequency, urgency, hesitancy or dysuria. MUSCULOSKELETAL: No joint or muscle pain, no back pain, no recent trauma. DERMATOLOGIC: No rash, no itching, no lesions. ENDOCRINE: No polyuria, polydipsia, no heat or cold intolerance. No recent change in weight. HEMATOLOGICAL: No anemia or easy bruising or  bleeding. NEUROLOGIC: No headache, seizures, numbness, tingling or weakness. PSYCHIATRIC: No depression, no loss of interest in normal activity or change in sleep pattern.     Exam:   BP 128/76    Ht 5\' 11"  (1.803 m)    Wt 256 lb (116.1 kg)    BMI 35.70 kg/m   Body mass index is 35.7 kg/m.  General appearance : Well developed well nourished female. No acute distress HEENT: Eyes: no retinal hemorrhage or exudates,  Neck supple, trachea midline, no carotid bruits, no thyroidmegaly Lungs: Clear to auscultation, no rhonchi or wheezes, or rib retractions  Heart: Regular rate and rhythm, no murmurs or gallops Breast:Examined in sitting and supine position were symmetrical in appearance, no palpable masses or tenderness,  no skin retraction, no nipple inversion, no nipple discharge, no skin discoloration, no axillary or supraclavicular lymphadenopathy Abdomen: no palpable masses or tenderness, no rebound or guarding Extremities: no edema or skin discoloration or tenderness  Pelvic: Vulva: Left raised warty lesion at the left mid vulva.             Vagina: No gross lesions or discharge  Cervix: No gross lesions or discharge.  Pap reflex done.  Uterus  AV, normal size, shape and consistency, non-tender and mobile  Adnexa  Without masses or tenderness  Anus: Normal   Assessment/Plan:  54 y.o. female for annual exam   1. Encounter for routine gynecological  examination with Papanicolaou smear of cervix Normal gynecologic exam.  Pap reflex done.  Breast exam normal.  Screening mammogram April 2021 was negative.  Cologuard 2021.  Health labs with family physician.  2. Menopausal symptoms Stopped the Progestin pill x 2 weeks.  No menses for 2 years and no withdrawal bleeding when stop the progestin pill.  Will confirm menopause with an [facility] today.  Rule out thyroid dysfunction. - FSH - TSH  3. Postcoital bleeding Complains of mild postcoital bleeding recently.  Normal gynecologic exam.   Follow-up for a pelvic ultrasound to assess the endometrium. - US Transvaginal Non-OB; Future  4. 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  F/U Excision.  5. Class 2 obesity due to excess calories without serious comorbidity with body mass index (BMI) of 35.0 to 35.9 in adult Recommend a lower calorie/carb diet.  Aerobic activities 5 times a week and light weightlifting every 2 days.  Genia Del MD, 10:57 AM 02/02/2020

## 2020-02-03 LAB — TSH: TSH: 1.32 mIU/L

## 2020-02-03 LAB — FOLLICLE STIMULATING HORMONE: FSH: 80.3 m[IU]/mL

## 2020-02-03 NOTE — Addendum Note (Signed)
Addended by: Berna Spare A on: 02/03/2020 09:57 AM   Modules accepted: Orders

## 2020-02-07 LAB — PAP IG W/ RFLX HPV ASCU

## 2020-02-22 ENCOUNTER — Ambulatory Visit (INDEPENDENT_AMBULATORY_CARE_PROVIDER_SITE_OTHER): Payer: BC Managed Care – PPO

## 2020-02-22 ENCOUNTER — Other Ambulatory Visit: Payer: Self-pay

## 2020-02-22 ENCOUNTER — Encounter: Payer: Self-pay | Admitting: Obstetrics & Gynecology

## 2020-02-22 ENCOUNTER — Ambulatory Visit (INDEPENDENT_AMBULATORY_CARE_PROVIDER_SITE_OTHER): Payer: BC Managed Care – PPO | Admitting: Obstetrics & Gynecology

## 2020-02-22 VITALS — BP 118/84

## 2020-02-22 DIAGNOSIS — N9089 Other specified noninflammatory disorders of vulva and perineum: Secondary | ICD-10-CM

## 2020-02-22 DIAGNOSIS — A630 Anogenital (venereal) warts: Secondary | ICD-10-CM | POA: Diagnosis not present

## 2020-02-22 DIAGNOSIS — N854 Malposition of uterus: Secondary | ICD-10-CM

## 2020-02-22 DIAGNOSIS — N930 Postcoital and contact bleeding: Secondary | ICD-10-CM | POA: Diagnosis not present

## 2020-02-22 NOTE — Progress Notes (Signed)
    Mallory [name] 28-Oct-1965 [id number]        54 y.o.  [id number]   RP: Postcoital bleeding for Pelvic US and excision of vulvar lesion.  HPI: Mild postcoital bleeding, resolved now.  No pelvic pain.  Raised left vulvar/intertriginous lesions.   OB History  Gravida Para Term Preterm AB Living  2 2 2     2   SAB TAB Ectopic Multiple Live Births          2    # Outcome Date GA Lbr Len/2nd Weight Sex Delivery Anes PTL Lv  2 Term 02/05/88 [redacted]w[redacted]d  7 lb 6 oz (3.345 kg) F Vag-Spont   LIV  1 Term 04/17/84 [redacted]w[redacted]d  7 lb 6 oz (3.345 kg) M Vag-Spont   LIV    Past medical history,surgical history, problem list, medications, allergies, family history and social history were all reviewed and documented in the EPIC chart.   Directed ROS with pertinent positives and negatives documented in the history of present illness/assessment and plan.  Exam:  Vitals:   02/22/20 1002  BP: 118/84   General appearance:  Normal  Pelvic 02/24/20 today: T/V images.  Anteverted uterus atrophic with no myometrial mass measured at 6.44 x 3.96 x 3.05 cm.  The endometrial lining is thin and symmetrical with no mass or thickening seen, measured at 1.85 mm.  Both ovaries are normal in size.  No adnexal mass.  No free fluid in the posterior cul-de-sac.  Gynecologic exam: Vulva:  2 small raised lesions at the left vulva/intertriginous area.  Betadine prep, local anesthesia with Lidocaine 1% and excision of both lesions with the scalpel.  Very small base.  Silver Nitrate applied at both bases for hemostasis.  No complication.  Well tolerated.   Assessment/Plan:  54 y.o. G2P2002   1. Postcoital bleeding Pelvic ultrasound findings reviewed with patient.  Normal uterus with a thin endometrial lining at 1.85 mm.  Normal ovaries.  Vagina and cervix normal on last gynecologic exam.  Patient reassured.  2. 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  Left vulvar/intertriginous lesions excised.  Very small base  cauterized with silver nitrate.  Good hemostasis.  No complication.  Postprocedure precautions reviewed.  Pending pathology.  57 MD, 10:09 AM 02/22/2020

## 2020-02-24 LAB — TISSUE SPECIMEN

## 2020-02-24 LAB — PATHOLOGY REPORT

## 2020-10-03 ENCOUNTER — Encounter: Payer: Self-pay | Admitting: Obstetrics & Gynecology

## 2020-10-26 ENCOUNTER — Encounter: Payer: Self-pay | Admitting: Anesthesiology

## 2020-10-27 ENCOUNTER — Encounter: Payer: Self-pay | Admitting: *Deleted

## 2020-11-30 ENCOUNTER — Encounter: Payer: Self-pay | Admitting: Obstetrics & Gynecology

## 2021-03-16 ENCOUNTER — Encounter: Payer: Self-pay | Admitting: Obstetrics & Gynecology

## 2021-03-16 ENCOUNTER — Ambulatory Visit (INDEPENDENT_AMBULATORY_CARE_PROVIDER_SITE_OTHER): Payer: BC Managed Care – PPO | Admitting: Obstetrics & Gynecology

## 2021-03-16 ENCOUNTER — Other Ambulatory Visit: Payer: Self-pay

## 2021-03-16 ENCOUNTER — Other Ambulatory Visit (HOSPITAL_COMMUNITY)
Admission: RE | Admit: 2021-03-16 | Discharge: 2021-03-16 | Disposition: A | Discharge status: Home / Self Care | Payer: BC Managed Care – PPO | Source: Ambulatory Visit | Attending: Obstetrics & Gynecology | Admitting: Obstetrics & Gynecology

## 2021-03-16 VITALS — BP 120/80 | HR 80 | Resp 16 | Ht 70.5 in | Wt 264.0 lb

## 2021-03-16 DIAGNOSIS — Z6837 Body mass index (BMI) 37.0-37.9, adult: Secondary | ICD-10-CM

## 2021-03-16 DIAGNOSIS — Z01419 Encounter for gynecological examination (general) (routine) without abnormal findings: Secondary | ICD-10-CM | POA: Insufficient documentation

## 2021-03-16 DIAGNOSIS — E6609 Other obesity due to excess calories: Secondary | ICD-10-CM | POA: Diagnosis not present

## 2021-03-16 DIAGNOSIS — Z780 Asymptomatic menopausal state: Secondary | ICD-10-CM | POA: Diagnosis not present

## 2021-03-16 NOTE — Progress Notes (Signed)
Mallory [name] 07-12-1965 [id number]   History:    55 y.o. G2P2L2 Married   RP:  Established patient presenting for annual gyn exam    HPI: Postmenopause, well on no HRT.  No PMB.  No pelvic pain.  No pain with intercourse.  Pelvic US 01/2020 Normal.  Urine and bowel movements normal.  Breasts normal.  Mother deceased of an ovarian cancer at age 35 (ovarian cancer diagnosed at 19).  Body mass index 37.34  Exercising regularly.  Health labs with family physician. ColoGard Neg 2021.   Past medical history,surgical history, family history and social history were all reviewed and documented in the EPIC chart.  Gynecologic History Patient's last menstrual period was 09/02/2016 (approximate).  Obstetric History OB History  Gravida Para Term Preterm AB Living  2 2 2     2   SAB IAB Ectopic Multiple Live Births          2    # Outcome Date GA Lbr Len/2nd Weight Sex Delivery Anes PTL Lv  2 Term 02/05/88 [redacted]w[redacted]d  7 lb 6 oz (3.345 kg) F Vag-Spont   LIV  1 Term 04/17/84 [redacted]w[redacted]d  7 lb 6 oz (3.345 kg) M Vag-Spont   LIV     ROS: A ROS was performed and pertinent positives and negatives are included in the history.  GENERAL: No fevers or chills. HEENT: No change in vision, no earache, sore throat or sinus congestion. NECK: No pain or stiffness. CARDIOVASCULAR: No chest pain or pressure. No palpitations. PULMONARY: No shortness of breath, cough or wheeze. GASTROINTESTINAL: No abdominal pain, nausea, vomiting or diarrhea, melena or bright red blood per rectum. GENITOURINARY: No urinary frequency, urgency, hesitancy or dysuria. MUSCULOSKELETAL: No joint or muscle pain, no back pain, no recent trauma. DERMATOLOGIC: No rash, no itching, no lesions. ENDOCRINE: No polyuria, polydipsia, no heat or cold intolerance. No recent change in weight. HEMATOLOGICAL: No anemia or easy bruising or bleeding. NEUROLOGIC: No headache, seizures, numbness, tingling or weakness. PSYCHIATRIC: No depression, no loss of interest in  normal activity or change in sleep pattern.     Exam:   BP 120/80   Pulse 80   Resp 16   Ht 5' 10.5" (1.791 m)   Wt 264 lb (119.7 kg)   LMP 09/02/2016 (Approximate)   BMI 37.34 kg/m   Body mass index is 37.34 kg/m.  General appearance : Well developed well nourished female. No acute distress HEENT: Eyes: no retinal hemorrhage or exudates,  Neck supple, trachea midline, no carotid bruits, no thyroidmegaly Lungs: Clear to auscultation, no rhonchi or wheezes, or rib retractions  Heart: Regular rate and rhythm, no murmurs or gallops Breast:Examined in sitting and supine position were symmetrical in appearance, no palpable masses or tenderness,  no skin retraction, no nipple inversion, no nipple discharge, no skin discoloration, no axillary or supraclavicular lymphadenopathy Abdomen: no palpable masses or tenderness, no rebound or guarding Extremities: no edema or skin discoloration or tenderness  Pelvic: Vulva: Normal             Vagina: No gross lesions or discharge  Cervix: No gross lesions or discharge.  Pap reflex done.  Uterus  AV, normal size, shape and consistency, non-tender and mobile  Adnexa  Without masses or tenderness  Anus: Normal   Assessment/Plan:  55 y.o. female for annual exam   1. Encounter for routine gynecological examination with Papanicolaou smear of cervix Normal gynecologic exam.  Pap reflex done.  Breast exam normal.  Screening mammogram  April 2022 was negative.  Cologuard 2021. - Cytology - PAP( [place])  2. Postmenopause Well on no hormone replacement therapy.  No postmenopausal bleeding.  Vitamin D supplements, calcium intake of 1.5 g/day total and regular weightbearing physical activities.  3. Class 2 obesity due to excess calories without serious comorbidity with body mass index (BMI) of 37.0 to 37.9 in adult Recommend a lower calorie/carb diet.  Aerobic activities 5 times a week and light weightlifting every 2 days.  Other orders -  buPROPion (WELLBUTRIN XL) 150 MG 24 hr tablet; Take 150 mg by mouth every morning. - clonazePAM (KLONOPIN) 0.5 MG tablet; SMARTSIG:0.5-1 Tablet(s) By Mouth 1-2 Times Daily PRN - ezetimibe-simvastatin (VYTORIN) 10-20 MG tablet; Take 1 tablet by mouth daily.  [facility]  Genia Del MD, 1:46 PM 03/16/2021

## 2021-03-19 LAB — CYTOLOGY - PAP: Diagnosis: NEGATIVE

## 2021-03-21 ENCOUNTER — Encounter: Payer: Self-pay | Admitting: Obstetrics & Gynecology

## 2021-12-11 ENCOUNTER — Encounter: Payer: Self-pay | Admitting: Obstetrics & Gynecology

## 2021-12-12 ENCOUNTER — Other Ambulatory Visit: Payer: Self-pay

## 2021-12-12 DIAGNOSIS — N950 Postmenopausal bleeding: Secondary | ICD-10-CM

## 2021-12-13 ENCOUNTER — Encounter: Payer: Self-pay | Admitting: Obstetrics & Gynecology

## 2021-12-13 ENCOUNTER — Ambulatory Visit: Payer: BC Managed Care – PPO

## 2021-12-13 ENCOUNTER — Ambulatory Visit (INDEPENDENT_AMBULATORY_CARE_PROVIDER_SITE_OTHER): Payer: BC Managed Care – PPO | Admitting: Obstetrics & Gynecology

## 2021-12-13 VITALS — BP 114/68

## 2021-12-13 DIAGNOSIS — N950 Postmenopausal bleeding: Secondary | ICD-10-CM | POA: Diagnosis not present

## 2021-12-13 NOTE — Progress Notes (Signed)
    Mallory [name] [birth year] [id number]        56 y.o.  G2P2002   RP: Light PMB x 3 days  HPI: Postmenopause x 5 years. Well on no HRT.  No pelvic pain.  Had Postcoital bleeding in 2021.  Then no PMB until June 20th when she started having light vaginal spotting and cramping.  No recent IC.  No abnormal vaginal discharge.   OB History  Gravida Para Term Preterm AB Living  2 2 2     2   SAB IAB Ectopic Multiple Live Births          2    # Outcome Date GA Lbr Len/2nd Weight Sex Delivery Anes PTL Lv  2 Term 02/05/88 [redacted]w[redacted]d  7 lb 6 oz (3.345 kg) F Vag-Spont   LIV  1 Term 04/17/84 [redacted]w[redacted]d  7 lb 6 oz (3.345 kg) M Vag-Spont   LIV    Past medical history,surgical history, problem list, medications, allergies, family history and social history were all reviewed and documented in the EPIC chart.   Directed ROS with pertinent positives and negatives documented in the history of present illness/assessment and plan.  Exam:  Vitals:   12/13/21 1137  BP: 114/68   General appearance:  Normal  Abdomen: Normal  Gynecologic exam: Vulva normal.  Speculum:  Cervix/Vagina normal.  Light dark blood at the EO.  No abnormal vaginal discharge.  Pelvic US today: Comparison is made with previous scan on February 22, 2020.  T/V images.  Anteverted uterus normal in size and shape with no myometrial mass.  The uterus is measured at 6.28 x 3.64 x 2.93 cm.  Thin and symmetrical endometrial lining measured at 1.7 mm with no obvious mass or thickening or abnormal blood flow seen.  Both ovaries are small with atrophic appearance.  No adnexal mass.  No free fluid in the pelvis.   Assessment/Plan:  56 y.o. G2P2L2   1. Postmenopausal bleeding Postmenopause x 5 years. Well on no HRT.  No pelvic pain.  Had Postcoital bleeding in 2021.  Then no PMB until June 20th when she started having light vaginal spotting and cramping.  No recent IC.  No abnormal vaginal discharge.  On gynecologic exam, light dark menstrual like  blood is present at the EO.  No lesion seen.  Pelvic US findings thoroughly reviewed with patient.  Reassured that the endometrial line is thin and symmetrical with no lesion seen.  Decision to observe.  Bleeding precautions discussed.  Other orders - dicyclomine (BENTYL) 20 MG tablet; Take 20 mg by mouth 3 (three) times daily. - cyclobenzaprine (FLEXERIL) 5 MG tablet; Take 5 mg by mouth 2 (two) times daily as needed. - ALPRAZolam (XANAX) 0.5 MG tablet; Take 0.25 mg by mouth 3 (three) times daily as needed. - Probiotic Product (PROBIOTIC PO)   Counseling and management of postmenopausal bleeding for 15 minutes.  Genia Del MD, 11:55 AM 12/13/2021

## 2021-12-14 ENCOUNTER — Encounter: Payer: Self-pay | Admitting: Obstetrics & Gynecology

## 2022-03-21 ENCOUNTER — Ambulatory Visit: Payer: BC Managed Care – PPO | Admitting: Obstetrics & Gynecology

## 2022-03-21 ENCOUNTER — Encounter: Payer: Self-pay | Admitting: Obstetrics & Gynecology

## 2022-06-19 ENCOUNTER — Encounter: Payer: Self-pay | Admitting: Obstetrics & Gynecology

## 2022-06-20 ENCOUNTER — Ambulatory Visit: Payer: BC Managed Care – PPO | Admitting: Obstetrics & Gynecology

## 2022-07-11 ENCOUNTER — Telehealth: Payer: Self-pay | Admitting: *Deleted

## 2022-07-11 DIAGNOSIS — R600 Localized edema: Secondary | ICD-10-CM

## 2022-07-11 NOTE — Telephone Encounter (Signed)
R/o pelvic mass given lower limb edema.   Future order placed. Patient is scheduled for 07-18-22. Encounter closed.

## 2022-07-11 NOTE — Telephone Encounter (Signed)
Patient left voicemail regarding lower extremity edema on triage voicemail.   RN returned call to patient. Patient states that she has had lower extremity edema for the past few months and it has progressively gotten worse. PCP referred her to vascular surgeon and cardiologist at [facility] (records in [place]). Had EKG on 06-04-22 and patient states "all of that was normal." Was seen yesterday by PT at [facility] and PT recommended she see her gynecologist because it could be something female related. Patient states she did not mention her mother having breast and ovarian cancer to the PT, so she was curious if Dr. Dellis Filbert recommended she schedule an appointment with her or if she had other recommendations as to what to do next with the edema?   RN advised would review with Dr. Dellis Filbert and return call with additional recommendations. Patient agreeable.

## 2022-07-11 NOTE — Telephone Encounter (Signed)
We have a Negative Pelvic US in 11/2021.  We can repeat a Pelvic US to r/o any pelvic mass that would be compressing the veins... Dr Dellis Filbert    Call to patient. Advised of message as seen from Dr. Dellis Filbert. Patient agreeable to schedule. Advised would send message to appointments to reach out and schedule PUS. Patient agreeable. Future order pended.   Dr. Dellis Filbert, please advise on diagnosis code for PUS.

## 2022-07-17 ENCOUNTER — Encounter: Payer: Self-pay | Admitting: Obstetrics & Gynecology

## 2022-07-18 ENCOUNTER — Encounter: Payer: Self-pay | Admitting: Obstetrics & Gynecology

## 2022-07-18 ENCOUNTER — Ambulatory Visit (INDEPENDENT_AMBULATORY_CARE_PROVIDER_SITE_OTHER): Payer: BC Managed Care – PPO

## 2022-07-18 ENCOUNTER — Ambulatory Visit (INDEPENDENT_AMBULATORY_CARE_PROVIDER_SITE_OTHER): Payer: BC Managed Care – PPO | Admitting: Obstetrics & Gynecology

## 2022-07-18 VITALS — BP 116/70 | HR 83

## 2022-07-18 DIAGNOSIS — R600 Localized edema: Secondary | ICD-10-CM

## 2022-07-18 NOTE — Progress Notes (Signed)
    Mallory [name] October 20, 1965 [id number]        56 y.o.  G2P2002   RP: Bilateral lower extremity oedema/R/O pelvic mass with Pelvic US   HPI: Bilateral lower extremity oedema.  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  Wearing pressure stockings.  No pelvic pain.  No PMB.  Sent to me for a Pelvic US to r/o a pelvic mass which could cause pressure on the venous system.   OB History  Gravida Para Term Preterm AB Living  2 2 2     2   SAB IAB Ectopic Multiple Live Births          2    # Outcome Date GA Lbr Len/2nd Weight Sex Delivery Anes PTL Lv  2 Term 02/05/88 [redacted]w[redacted]d  7 lb 6 oz (3.345 kg) F Vag-Spont   LIV  1 Term 04/17/84 [redacted]w[redacted]d  7 lb 6 oz (3.345 kg) M Vag-Spont   LIV    Past medical history,surgical history, problem list, medications, allergies, family history and social history were all reviewed and documented in the EPIC chart.   Directed ROS with pertinent positives and negatives documented in the history of present illness/assessment and plan.  Exam:  There were no vitals filed for this visit. General appearance:  Normal  Pelvic US today: T/V images.  Small anteverted uterus normal in size and shape with no myometrial mass.  The uterus is measured at 6.05 x 3.89 x 2.73 cm.  The endometrial lining is thin and symmetrical measured at 2.02 mm with no mass or thickening seen.  Both ovaries are small with atrophic appearance.  No adnexal mass and no free fluid seen in the pelvis transvaginally or transabdominally.   Assessment/Plan:  57 y.o. G2P2002   1. Lower extremity edema Bilateral lower extremity oedema.  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  Wearing pressure stockings.  No pelvic pain.  No PMB.  Sent to me for a Pelvic US to r/o a pelvic mass which could cause pressure on the venous system. Pelvic US findings thoroughly reviewed with patient.  Completely negative scan with normal uterus and ovaries.  No pelvic mass.   Patient reassured.   Other orders - cetirizine (ZYRTEC ALLERGY) 10 MG tablet - rosuvastatin (CRESTOR) 10 MG tablet; Take one tablet by mouth every Monday, Wednesday and Friday. - predniSONE (DELTASONE) 10 MG tablet; PLEASE SEE ATTACHED FOR DETAILED DIRECTIONS - meloxicam (MOBIC) 15 MG tablet; Take 15 mg by mouth daily as needed.   Princess Bruins MD, 4:39 PM 07/18/2022

## 2022-10-14 ENCOUNTER — Other Ambulatory Visit (HOSPITAL_COMMUNITY)
Admission: RE | Admit: 2022-10-14 | Discharge: 2022-10-14 | Disposition: A | Discharge status: Home / Self Care | Payer: BC Managed Care – PPO | Source: Ambulatory Visit | Attending: Obstetrics & Gynecology | Admitting: Obstetrics & Gynecology

## 2022-10-14 ENCOUNTER — Encounter: Payer: Self-pay | Admitting: Obstetrics & Gynecology

## 2022-10-14 ENCOUNTER — Ambulatory Visit (INDEPENDENT_AMBULATORY_CARE_PROVIDER_SITE_OTHER): Payer: BC Managed Care – PPO | Admitting: Obstetrics & Gynecology

## 2022-10-14 VITALS — BP 116/72 | HR 90 | Ht 70.75 in | Wt 275.0 lb

## 2022-10-14 DIAGNOSIS — Z01419 Encounter for gynecological examination (general) (routine) without abnormal findings: Secondary | ICD-10-CM | POA: Diagnosis not present

## 2022-10-14 DIAGNOSIS — Z8041 Family history of malignant neoplasm of ovary: Secondary | ICD-10-CM

## 2022-10-14 DIAGNOSIS — E6609 Other obesity due to excess calories: Secondary | ICD-10-CM

## 2022-10-14 DIAGNOSIS — Z780 Asymptomatic menopausal state: Secondary | ICD-10-CM | POA: Diagnosis not present

## 2022-10-14 NOTE — Progress Notes (Signed)
Mallory [name] [birth year] [id number]   History:    57 y.o. G2P2L2 Married   RP:  Established patient presenting for annual gyn exam    HPI: Postmenopause, well on no HRT.  No PMB.  No pelvic pain. No pain with intercourse. Pap Neg 02/2021.  Pap reflex today. No h/o abnormal Pap. Urine and bowel movements normal.  Breasts normal.  Mammo Neg 09/2020, will schedule.  Mother deceased of an ovarian cancer at age 81 (ovarian cancer diagnosed at 26).  Will schedule a screening Pelvic US here.  Body mass index 38.63.  Exercising regularly.  Trying to loose weight with lower calories/carbs. BD Normal 09/2020.  Health labs with family physician. ColoGard Neg 2021.     Past medical history,surgical history, family history and social history were all reviewed and documented in the EPIC chart.  Gynecologic History Patient's last menstrual period was 08/07/2016 (approximate).  Obstetric History OB History  Gravida Para Term Preterm AB Living  SAB IAB Ectopic Multiple Live Births          2    # Outcome Date GA Lbr Len/2nd Weight Sex Delivery Anes PTL Lv  2 Term 02/05/88 [redacted]w[redacted]d  7 lb 6 oz (3.345 kg) F Vag-Spont   LIV  1 Term 04/17/84 [redacted]w[redacted]d  7 lb 6 oz (3.345 kg) M Vag-Spont   LIV     ROS: A ROS was performed and pertinent positives and negatives are included in the history. GENERAL: No fevers or chills. HEENT: No change in vision, no earache, sore throat or sinus congestion. NECK: No pain or stiffness. CARDIOVASCULAR: No chest pain or pressure. No palpitations. PULMONARY: No shortness of breath, cough or wheeze. GASTROINTESTINAL: No abdominal pain, nausea, vomiting or diarrhea, melena or bright red blood per rectum. GENITOURINARY: No urinary frequency, urgency, hesitancy or dysuria. MUSCULOSKELETAL: No joint or muscle pain, no back pain, no recent trauma. DERMATOLOGIC: No rash, no itching, no lesions. ENDOCRINE: No polyuria, polydipsia, no heat or cold intolerance. No recent change in weight.  HEMATOLOGICAL: No anemia or easy bruising or bleeding. NEUROLOGIC: No headache, seizures, numbness, tingling or weakness. PSYCHIATRIC: No depression, no loss of interest in normal activity or change in sleep pattern.     Exam:   BP 116/72   Pulse 90   Ht 5' 10.75" (1.797 m)   Wt 275 lb (124.7 kg)   LMP 08/07/2016 (Approximate) Comment: sexually active  SpO2 99%   BMI 38.63 kg/m   Body mass index is 38.63 kg/m.  General appearance : Well developed well nourished female. No acute distress HEENT: Eyes: no retinal hemorrhage or exudates,  Neck supple, trachea midline, no carotid bruits, no thyroidmegaly Lungs: Clear to auscultation, no rhonchi or wheezes, or rib retractions  Heart: Regular rate and rhythm, no murmurs or gallops Breast:Examined in sitting and supine position were symmetrical in appearance, no palpable masses or tenderness,  no skin retraction, no nipple inversion, no nipple discharge, no skin discoloration, no axillary or supraclavicular lymphadenopathy Abdomen: no palpable masses or tenderness, no rebound or guarding Extremities: no edema or skin discoloration or tenderness  Pelvic: Vulva: Normal             Vagina: No gross lesions or discharge  Cervix: No gross lesions or discharge.  Pap reflex done.  Uterus  AV, normal size, shape and consistency, non-tender and mobile  Adnexa  Without masses or tenderness  Anus: Normal   Assessment/Plan:  57 y.o.  female for annual exam   1. Encounter for routine gynecological examination with Papanicolaou smear of cervix Postmenopause, well on no HRT.  No PMB.  No pelvic pain. No pain with intercourse. Pap Neg 02/2021.  Pap reflex today. No h/o abnormal Pap. Urine and bowel movements normal.  Breasts normal.  Mammo Neg 09/2020, will schedule.  Mother deceased of an ovarian cancer at age 58 (ovarian cancer diagnosed at 75).  Will schedule a screening Pelvic US here.  Body mass index 38.63.  Exercising regularly.  Trying to loose  weight with lower calories/carbs. BD Normal 09/2020.  Health labs with family physician. ColoGard Neg 2021.   - Cytology - PAP( [place])  2. Postmenopause Postmenopause, well on no HRT.  No PMB.  No pelvic pain. No pain with intercourse.   3. Family history of ovarian cancer Mother deceased of an ovarian cancer at age 40 (ovarian cancer diagnosed at 2).  Patient declines BSO.  Will do screening Pelvic US.  Schedule here. - US Transvaginal Non-OB; Future  4. Class 2 obesity due to excess calories without serious comorbidity with body mass index (BMI) of 38.0 to 38.9 in adult Body mass index 38.63.  Exercising regularly.  Trying to loose weight with lower calories/carbs.  Intermittent fasting recommended.  Other orders - cyclobenzaprine (FLEXERIL) 5 MG tablet; TAKE ONE TABLET BY MOUTH 2 (TWO) TIMES A DAY AS NEEDED. - levocetirizine (XYZAL) 5 MG tablet; Take by mouth. - meloxicam (MOBIC) 15 MG tablet; Take 15 mg by mouth daily as needed. - omeprazole (PRILOSEC) 40 MG capsule; TAKE ONE CAPSULE BY MOUTH 30 MINUTES BEFORE BREAKFAST   Genia Del MD, 2:01 PM

## 2022-10-17 LAB — CYTOLOGY - PAP: Diagnosis: NEGATIVE

## 2022-11-21 ENCOUNTER — Other Ambulatory Visit: Payer: BC Managed Care – PPO | Admitting: Obstetrics & Gynecology

## 2022-11-21 ENCOUNTER — Ambulatory Visit: Payer: BC Managed Care – PPO

## 2022-12-25 ENCOUNTER — Other Ambulatory Visit: Payer: Self-pay
